# Patient Record
Sex: Male | Born: 1940
Health system: Southern US, Community
[De-identification: ages and names within clinical notes are randomized; demographics above are authoritative.]

## PROBLEM LIST (undated history)

## (undated) DIAGNOSIS — E119 Type 2 diabetes mellitus without complications: Secondary | ICD-10-CM

## (undated) DIAGNOSIS — I509 Heart failure, unspecified: Secondary | ICD-10-CM

## (undated) DIAGNOSIS — E785 Hyperlipidemia, unspecified: Secondary | ICD-10-CM

## (undated) DIAGNOSIS — I219 Acute myocardial infarction, unspecified: Secondary | ICD-10-CM

## (undated) DIAGNOSIS — I10 Essential (primary) hypertension: Secondary | ICD-10-CM

## (undated) HISTORY — PX: APPENDECTOMY: SHX54

## (undated) HISTORY — PX: PR VEIN BYPASS GRAFT,AORTO-FEM-POP: 35551

## (undated) HISTORY — DX: Heart failure, unspecified: I50.9

## (undated) HISTORY — PX: CARDIAC SURGERY: SHX584

## (undated) HISTORY — DX: Acute myocardial infarction, unspecified: I21.9

## (undated) HISTORY — DX: Type 2 diabetes mellitus without complications: E11.9

## (undated) HISTORY — DX: Hyperlipidemia, unspecified: E78.5

## (undated) HISTORY — PX: CORONARY ARTERY BYPASS GRAFT: SHX141

## (undated) HISTORY — DX: Essential (primary) hypertension: I10

## (undated) HISTORY — PX: HERNIA REPAIR: SHX51

---

## 1997-10-15 ENCOUNTER — Inpatient Hospital Stay (HOSPITAL_COMMUNITY): Admission: AD | Admit: 1997-10-15 | Discharge: 1997-10-24 | Payer: Self-pay | Admitting: Cardiology

## 2008-01-24 ENCOUNTER — Ambulatory Visit: Payer: Self-pay | Admitting: Specialist

## 2008-10-17 ENCOUNTER — Ambulatory Visit: Payer: Self-pay | Admitting: Specialist

## 2009-04-16 ENCOUNTER — Encounter: Admission: RE | Admit: 2009-04-16 | Discharge: 2009-04-16 | Payer: Self-pay | Admitting: Unknown Physician Specialty

## 2011-10-10 ENCOUNTER — Emergency Department: Payer: Self-pay | Admitting: Emergency Medicine

## 2011-10-10 LAB — URINALYSIS, COMPLETE
Glucose,UR: >=500 mg/dL (ref 0–75)
Nitrite: NEGATIVE
Ph: 5 (ref 4.5–8.0)
Protein: 30
RBC,UR: 113 /HPF (ref 0–5)
Specific Gravity: 1.014 (ref 1.003–1.030)

## 2011-11-03 ENCOUNTER — Ambulatory Visit: Payer: Self-pay | Admitting: Urology

## 2011-11-03 LAB — CBC WITH DIFFERENTIAL/PLATELET
Basophil #: 0 10*3/uL (ref 0.0–0.1)
Eosinophil %: 2.1 %
Lymphocyte #: 1.2 10*3/uL (ref 1.0–3.6)
Lymphocyte %: 27.9 %
MCH: 30.6 pg (ref 26.0–34.0)
MCV: 91 fL (ref 80–100)
Monocyte %: 7.8 %
Neutrophil %: 61.6 %
Platelet: 148 10*3/uL — ABNORMAL LOW (ref 150–440)
RDW: 13.8 % (ref 11.5–14.5)
WBC: 4.4 10*3/uL (ref 3.8–10.6)

## 2011-11-03 LAB — BASIC METABOLIC PANEL
Calcium, Total: 8.9 mg/dL (ref 8.5–10.1)
Chloride: 103 mmol/L (ref 98–107)
Osmolality: 275 (ref 275–301)
Potassium: 4.6 mmol/L (ref 3.5–5.1)

## 2011-11-12 ENCOUNTER — Ambulatory Visit: Payer: Self-pay | Admitting: Urology

## 2011-12-31 ENCOUNTER — Encounter: Payer: Self-pay | Admitting: Urology

## 2012-01-15 ENCOUNTER — Encounter: Payer: Self-pay | Admitting: Urology

## 2013-07-17 DIAGNOSIS — I2510 Atherosclerotic heart disease of native coronary artery without angina pectoris: Secondary | ICD-10-CM | POA: Insufficient documentation

## 2013-07-17 DIAGNOSIS — E7849 Other hyperlipidemia: Secondary | ICD-10-CM | POA: Insufficient documentation

## 2013-07-17 DIAGNOSIS — I10 Essential (primary) hypertension: Secondary | ICD-10-CM | POA: Insufficient documentation

## 2013-07-17 DIAGNOSIS — I739 Peripheral vascular disease, unspecified: Secondary | ICD-10-CM | POA: Insufficient documentation

## 2013-07-17 DIAGNOSIS — E119 Type 2 diabetes mellitus without complications: Secondary | ICD-10-CM | POA: Insufficient documentation

## 2013-07-17 DIAGNOSIS — N400 Enlarged prostate without lower urinary tract symptoms: Secondary | ICD-10-CM | POA: Insufficient documentation

## 2013-07-17 DIAGNOSIS — L408 Other psoriasis: Secondary | ICD-10-CM | POA: Insufficient documentation

## 2014-07-03 NOTE — Op Note (Signed)
PATIENT NAME:  Christopher Snow, Christopher Snow MR#:  [id number] DATE OF BIRTH:  03-May-1940  DATE OF PROCEDURE:  11/12/2011  PREOPERATIVE DIAGNOSES:  1. Urinary retention.  2. Massive benign prostatic hypertrophy.   POSTOPERATIVE DIAGNOSES:  1. Urinary retention.  2. Massive benign prostatic hypertrophy.  PROCEDURE PERFORMED: Photovaporization of the prostate with a green light laser.   SURGEON: Anola GurneyMichael Steve Gregg, MD   ANESTHETIST: Cena BentonG. F. Van Staveren, MD    ANESTHESIA: General.   INDICATIONS: See the dictated History and Physical. After informed consent, the patient requests the above procedure.   OPERATIVE SUMMARY: After adequate anesthesia had been attained, the patient was placed into dorsal lithotomy position and the perineum was prepped and draped in the usual fashion. The laser scope was coupled with the camera and then visually advanced into the bladder. The patient had trilobar benign prostatic hypertrophy with large intravesical growth of median lobe. He had a heavily trabeculated bladder with cellules and small diverticula present. Both ureteral orifices were identified and had clear efflux. No bladder tumors were identified. At this point, the green light XPS laser fiber was introduced through the scope and set at 80 watts. Part of the median lobe and bladder neck was vaporized. However, the tissue did not respond well at this power setting. The power was, therefore, increased to 120 watts. At this point, the majority of the median lobe was vaporized. Power was then increased to 180 watts and remaining obstructive tissue was vaporized from the level of the bladder neck to the verumontanum. The scope was then removed and a 22-French catheter placed. The catheter was irrigated until clear. A B and O suppository was placed.   The procedure was then terminated, and the patient was transferred to the recovery room in stable condition.   ____________________________ Suszanne ConnersMichael R. Evelene CroonWolff, MD [email or web address] Snow: 11/12/2011  13:12:25 ET T: 11/12/2011 13:28:28 ET JOB#: [id number]  cc: Suszanne ConnersMichael R. Evelene CroonWolff, MD, <Dictator> Orson ApeMICHAEL R Florencia Zaccaro MD ELECTRONICALLY SIGNED 11/12/2011 15:28

## 2014-07-03 NOTE — H&P (Signed)
PATIENT NAME:  Christopher Snow, Christopher Snow MR#:  [id number] DATE OF BIRTH:  11/24/1940  DATE OF ADMISSION:  11/09/2011  CHIEF COMPLAINT: Urinary retention.   HISTORY OF PRESENT ILLNESS: Mr. Christopher Snow is a 74 year old white male with a long history of lower urinary tract symptoms who developed urinary retention and currently has a Foley catheter in place. He is currently on Avodart and had been on Flomax but could not void in spite of that. He comes in now for photovaporization of the prostate with green light laser.   ALLERGIES: No drug allergies.   CURRENT MEDICATIONS:  1. Clindamycin ointment.  2. Rosuvastatin.  3. Glipizide. 4. Lisinopril. 5. Metformin. 6. Glucophage. 7. Citalopram. 8. Avodart.   PAST SURGICAL HISTORY:  1. Tonsillectomy in 1950. 2. Appendectomy in 1946. 3. Y graft in 1997.  4. Coronary artery bypass graft x5 in 1998. 5. Repair of ventral hernia in 2001.   SOCIAL HISTORY: He denied tobacco or alcohol use.   FAMILY HISTORY: Noncontributory.   PAST AND CURRENT MEDICAL CONDITIONS:  1. Diabetes. 2. Coronary artery disease. 3. Aortic vascular disease.  4. Hyperlipidemia.  5. History of benign renal cyst. 6. History of folliculitis. 7. Varicose veins.  REVIEW OF SYSTEMS: The patient denied chest pain, shortness of breath, stroke, diarrhea, bruising, or chest pain.   PHYSICAL EXAMINATION:   GENERAL: Well nourished white male in no distress.   HEENT: Sclerae were clear. Pupils were equally round and reactive to light and accommodation. Extraocular movements were intact.   NECK: Supple. No palpable cervical adenopathy. No audible carotid bruits.   LUNGS: Clear to auscultation.   CARDIOVASCULAR: Regular rhythm and rate without audible murmurs.   ABDOMEN: Soft, nontender abdomen.   GENITOURINARY: Uncircumcised. Testes were smooth and nontender.   RECTAL: 50 gram nodular prostate.   NEUROMUSCULAR: Grossly intact.  Cystoscopy June 27th indicated trilobar BPH with large  intravesical growth of median lobe and numerous bladder diverticula.   Creatinine was 1.2 mg/dL June 09WJ25th. PSA was 5.3 ng June 19th.    IMPRESSION:  1. Chronic bladder outlet obstruction. 2. Trilobar benign prostatic hypertrophy with median lobe.   PLAN: Photovaporization of the prostate with green light laser.   ____________________________ Suszanne ConnersMichael R. Evelene CroonWolff, MD [email or web address] Snow: 11/03/2011 10:17:37 ET T: 11/03/2011 10:32:19 ET JOB#: [id number]  cc: Suszanne ConnersMichael R. Evelene CroonWolff, MD, <Dictator> Orson ApeMICHAEL R WOLFF MD ELECTRONICALLY SIGNED 11/04/2011 12:42

## 2016-01-09 DIAGNOSIS — Z23 Encounter for immunization: Secondary | ICD-10-CM | POA: Diagnosis not present

## 2016-01-20 DIAGNOSIS — H60543 Acute eczematoid otitis externa, bilateral: Secondary | ICD-10-CM | POA: Diagnosis not present

## 2016-01-20 DIAGNOSIS — H60312 Diffuse otitis externa, left ear: Secondary | ICD-10-CM | POA: Diagnosis not present

## 2016-01-31 DIAGNOSIS — M542 Cervicalgia: Secondary | ICD-10-CM | POA: Diagnosis not present

## 2016-01-31 DIAGNOSIS — R0789 Other chest pain: Secondary | ICD-10-CM | POA: Diagnosis not present

## 2016-02-19 DIAGNOSIS — M5416 Radiculopathy, lumbar region: Secondary | ICD-10-CM | POA: Diagnosis not present

## 2016-02-19 DIAGNOSIS — M545 Low back pain: Secondary | ICD-10-CM | POA: Diagnosis not present

## 2016-02-19 DIAGNOSIS — M47816 Spondylosis without myelopathy or radiculopathy, lumbar region: Secondary | ICD-10-CM | POA: Diagnosis not present

## 2016-02-20 ENCOUNTER — Other Ambulatory Visit: Payer: Self-pay | Admitting: Student

## 2016-02-20 DIAGNOSIS — M5416 Radiculopathy, lumbar region: Secondary | ICD-10-CM

## 2016-02-20 DIAGNOSIS — M47816 Spondylosis without myelopathy or radiculopathy, lumbar region: Secondary | ICD-10-CM

## 2016-02-21 DIAGNOSIS — I10 Essential (primary) hypertension: Secondary | ICD-10-CM | POA: Diagnosis not present

## 2016-02-21 DIAGNOSIS — I25110 Atherosclerotic heart disease of native coronary artery with unstable angina pectoris: Secondary | ICD-10-CM | POA: Diagnosis not present

## 2016-02-21 DIAGNOSIS — R079 Chest pain, unspecified: Secondary | ICD-10-CM | POA: Diagnosis not present

## 2016-02-24 ENCOUNTER — Other Ambulatory Visit: Payer: Self-pay | Admitting: Student

## 2016-02-24 DIAGNOSIS — M47816 Spondylosis without myelopathy or radiculopathy, lumbar region: Secondary | ICD-10-CM

## 2016-03-04 DIAGNOSIS — I25110 Atherosclerotic heart disease of native coronary artery with unstable angina pectoris: Secondary | ICD-10-CM | POA: Diagnosis not present

## 2016-03-04 DIAGNOSIS — R079 Chest pain, unspecified: Secondary | ICD-10-CM | POA: Diagnosis not present

## 2016-03-06 DIAGNOSIS — I10 Essential (primary) hypertension: Secondary | ICD-10-CM | POA: Diagnosis not present

## 2016-03-06 DIAGNOSIS — R079 Chest pain, unspecified: Secondary | ICD-10-CM | POA: Diagnosis not present

## 2016-03-06 DIAGNOSIS — I2510 Atherosclerotic heart disease of native coronary artery without angina pectoris: Secondary | ICD-10-CM | POA: Diagnosis not present

## 2016-04-07 DIAGNOSIS — B354 Tinea corporis: Secondary | ICD-10-CM | POA: Diagnosis not present

## 2016-04-07 DIAGNOSIS — J069 Acute upper respiratory infection, unspecified: Secondary | ICD-10-CM | POA: Diagnosis not present

## 2016-05-21 DIAGNOSIS — M9903 Segmental and somatic dysfunction of lumbar region: Secondary | ICD-10-CM | POA: Diagnosis not present

## 2016-05-21 DIAGNOSIS — M9901 Segmental and somatic dysfunction of cervical region: Secondary | ICD-10-CM | POA: Diagnosis not present

## 2016-05-21 DIAGNOSIS — M9902 Segmental and somatic dysfunction of thoracic region: Secondary | ICD-10-CM | POA: Diagnosis not present

## 2016-05-26 DIAGNOSIS — M9902 Segmental and somatic dysfunction of thoracic region: Secondary | ICD-10-CM | POA: Diagnosis not present

## 2016-05-26 DIAGNOSIS — M9901 Segmental and somatic dysfunction of cervical region: Secondary | ICD-10-CM | POA: Diagnosis not present

## 2016-05-26 DIAGNOSIS — M9903 Segmental and somatic dysfunction of lumbar region: Secondary | ICD-10-CM | POA: Diagnosis not present

## 2016-05-28 DIAGNOSIS — M9901 Segmental and somatic dysfunction of cervical region: Secondary | ICD-10-CM | POA: Diagnosis not present

## 2016-05-28 DIAGNOSIS — M9903 Segmental and somatic dysfunction of lumbar region: Secondary | ICD-10-CM | POA: Diagnosis not present

## 2016-05-28 DIAGNOSIS — M9902 Segmental and somatic dysfunction of thoracic region: Secondary | ICD-10-CM | POA: Diagnosis not present

## 2016-06-01 DIAGNOSIS — M9902 Segmental and somatic dysfunction of thoracic region: Secondary | ICD-10-CM | POA: Diagnosis not present

## 2016-06-01 DIAGNOSIS — M9903 Segmental and somatic dysfunction of lumbar region: Secondary | ICD-10-CM | POA: Diagnosis not present

## 2016-06-01 DIAGNOSIS — M9901 Segmental and somatic dysfunction of cervical region: Secondary | ICD-10-CM | POA: Diagnosis not present

## 2016-06-03 DIAGNOSIS — M9903 Segmental and somatic dysfunction of lumbar region: Secondary | ICD-10-CM | POA: Diagnosis not present

## 2016-06-03 DIAGNOSIS — M9902 Segmental and somatic dysfunction of thoracic region: Secondary | ICD-10-CM | POA: Diagnosis not present

## 2016-06-03 DIAGNOSIS — M9901 Segmental and somatic dysfunction of cervical region: Secondary | ICD-10-CM | POA: Diagnosis not present

## 2016-06-04 DIAGNOSIS — R079 Chest pain, unspecified: Secondary | ICD-10-CM | POA: Diagnosis not present

## 2016-06-04 DIAGNOSIS — I2510 Atherosclerotic heart disease of native coronary artery without angina pectoris: Secondary | ICD-10-CM | POA: Diagnosis not present

## 2016-06-04 DIAGNOSIS — I10 Essential (primary) hypertension: Secondary | ICD-10-CM | POA: Diagnosis not present

## 2016-06-08 DIAGNOSIS — M9901 Segmental and somatic dysfunction of cervical region: Secondary | ICD-10-CM | POA: Diagnosis not present

## 2016-06-08 DIAGNOSIS — M9902 Segmental and somatic dysfunction of thoracic region: Secondary | ICD-10-CM | POA: Diagnosis not present

## 2016-06-08 DIAGNOSIS — M9903 Segmental and somatic dysfunction of lumbar region: Secondary | ICD-10-CM | POA: Diagnosis not present

## 2016-06-11 DIAGNOSIS — M9903 Segmental and somatic dysfunction of lumbar region: Secondary | ICD-10-CM | POA: Diagnosis not present

## 2016-06-11 DIAGNOSIS — M9901 Segmental and somatic dysfunction of cervical region: Secondary | ICD-10-CM | POA: Diagnosis not present

## 2016-06-11 DIAGNOSIS — M9902 Segmental and somatic dysfunction of thoracic region: Secondary | ICD-10-CM | POA: Diagnosis not present

## 2016-06-16 DIAGNOSIS — M9902 Segmental and somatic dysfunction of thoracic region: Secondary | ICD-10-CM | POA: Diagnosis not present

## 2016-06-16 DIAGNOSIS — M9903 Segmental and somatic dysfunction of lumbar region: Secondary | ICD-10-CM | POA: Diagnosis not present

## 2016-06-16 DIAGNOSIS — M9901 Segmental and somatic dysfunction of cervical region: Secondary | ICD-10-CM | POA: Diagnosis not present

## 2016-06-18 DIAGNOSIS — M9902 Segmental and somatic dysfunction of thoracic region: Secondary | ICD-10-CM | POA: Diagnosis not present

## 2016-06-18 DIAGNOSIS — M9903 Segmental and somatic dysfunction of lumbar region: Secondary | ICD-10-CM | POA: Diagnosis not present

## 2016-06-18 DIAGNOSIS — M9901 Segmental and somatic dysfunction of cervical region: Secondary | ICD-10-CM | POA: Diagnosis not present

## 2016-06-23 DIAGNOSIS — M9903 Segmental and somatic dysfunction of lumbar region: Secondary | ICD-10-CM | POA: Diagnosis not present

## 2016-06-23 DIAGNOSIS — M9902 Segmental and somatic dysfunction of thoracic region: Secondary | ICD-10-CM | POA: Diagnosis not present

## 2016-06-23 DIAGNOSIS — M9901 Segmental and somatic dysfunction of cervical region: Secondary | ICD-10-CM | POA: Diagnosis not present

## 2016-08-18 DIAGNOSIS — M9901 Segmental and somatic dysfunction of cervical region: Secondary | ICD-10-CM | POA: Diagnosis not present

## 2016-08-18 DIAGNOSIS — M9902 Segmental and somatic dysfunction of thoracic region: Secondary | ICD-10-CM | POA: Diagnosis not present

## 2016-08-18 DIAGNOSIS — M9903 Segmental and somatic dysfunction of lumbar region: Secondary | ICD-10-CM | POA: Diagnosis not present

## 2016-08-19 DIAGNOSIS — S90121A Contusion of right lesser toe(s) without damage to nail, initial encounter: Secondary | ICD-10-CM | POA: Diagnosis not present

## 2016-08-31 DIAGNOSIS — S90121A Contusion of right lesser toe(s) without damage to nail, initial encounter: Secondary | ICD-10-CM | POA: Diagnosis not present

## 2016-12-04 DIAGNOSIS — Z23 Encounter for immunization: Secondary | ICD-10-CM | POA: Diagnosis not present

## 2017-02-08 DIAGNOSIS — Z23 Encounter for immunization: Secondary | ICD-10-CM | POA: Diagnosis not present

## 2017-02-08 DIAGNOSIS — S61202A Unspecified open wound of right middle finger without damage to nail, initial encounter: Secondary | ICD-10-CM | POA: Diagnosis not present

## 2017-02-11 DIAGNOSIS — S61202D Unspecified open wound of right middle finger without damage to nail, subsequent encounter: Secondary | ICD-10-CM | POA: Diagnosis not present

## 2017-02-11 DIAGNOSIS — Z5189 Encounter for other specified aftercare: Secondary | ICD-10-CM | POA: Diagnosis not present

## 2017-03-02 DIAGNOSIS — L03032 Cellulitis of left toe: Secondary | ICD-10-CM | POA: Diagnosis not present

## 2017-03-02 DIAGNOSIS — B351 Tinea unguium: Secondary | ICD-10-CM | POA: Diagnosis not present

## 2017-03-02 DIAGNOSIS — L97521 Non-pressure chronic ulcer of other part of left foot limited to breakdown of skin: Secondary | ICD-10-CM | POA: Diagnosis not present

## 2017-03-02 DIAGNOSIS — E1140 Type 2 diabetes mellitus with diabetic neuropathy, unspecified: Secondary | ICD-10-CM | POA: Diagnosis not present

## 2017-03-04 ENCOUNTER — Other Ambulatory Visit (INDEPENDENT_AMBULATORY_CARE_PROVIDER_SITE_OTHER): Payer: PPO

## 2017-03-04 ENCOUNTER — Encounter (INDEPENDENT_AMBULATORY_CARE_PROVIDER_SITE_OTHER): Payer: Self-pay | Admitting: Vascular Surgery

## 2017-03-04 ENCOUNTER — Ambulatory Visit (INDEPENDENT_AMBULATORY_CARE_PROVIDER_SITE_OTHER): Payer: PPO | Admitting: Vascular Surgery

## 2017-03-04 DIAGNOSIS — I7025 Atherosclerosis of native arteries of other extremities with ulceration: Secondary | ICD-10-CM | POA: Diagnosis not present

## 2017-03-04 DIAGNOSIS — L97529 Non-pressure chronic ulcer of other part of left foot with unspecified severity: Secondary | ICD-10-CM | POA: Diagnosis not present

## 2017-03-04 DIAGNOSIS — R0989 Other specified symptoms and signs involving the circulatory and respiratory systems: Secondary | ICD-10-CM

## 2017-03-04 DIAGNOSIS — I25119 Atherosclerotic heart disease of native coronary artery with unspecified angina pectoris: Secondary | ICD-10-CM

## 2017-03-04 DIAGNOSIS — E1151 Type 2 diabetes mellitus with diabetic peripheral angiopathy without gangrene: Secondary | ICD-10-CM | POA: Diagnosis not present

## 2017-03-07 ENCOUNTER — Encounter (INDEPENDENT_AMBULATORY_CARE_PROVIDER_SITE_OTHER): Payer: Self-pay | Admitting: Vascular Surgery

## 2017-03-07 DIAGNOSIS — E119 Type 2 diabetes mellitus without complications: Secondary | ICD-10-CM | POA: Insufficient documentation

## 2017-03-07 DIAGNOSIS — I2510 Atherosclerotic heart disease of native coronary artery without angina pectoris: Secondary | ICD-10-CM | POA: Insufficient documentation

## 2017-03-07 NOTE — Progress Notes (Signed)
MRN : [id number]  Christopher Snow is a 76 y.o. (05-Oct-1940) male who presents with chief complaint of  Chief Complaint  Patient presents with  . New Patient (Initial Visit)    ref Graciela Husbands diminshed pulses  .  History of Present Illness:  The patient is seen for evaluation of painful lower extremities and diminished pulses. Patient notes the pain is always associated with activity and is very consistent day today. Typically, the pain occurs at less than one block, progress is as activity continues to the point that the patient must stop walking. Resting including standing still for several minutes allowed resumption of the activity and the ability to walk a similar distance before stopping again. Uneven terrain and inclined shorten the distance. The pain has been progressive over the past several years. The patient states the inability to walk is now having a profound negative impact on quality of life and daily activities.  He describes having an aortobifemoral bypass remotely.  He also notes he is status post CABG remotely.  The patient denies rest pain or dangling of an extremity off the side of the bed during the night for relief. No open wounds or sores at this time. No prior interventions or surgeries.  No history of back problems or DJD of the lumbar sacral spine.   The patient denies changes in claudication symptoms or new rest pain symptoms.  No new ulcers or wounds of the foot.  The patient's blood pressure has been stable and relatively well controlled. The patient denies amaurosis fugax or recent TIA symptoms. There are no recent neurological changes noted. The patient denies history of DVT, PE or superficial thrombophlebitis. The patient denies recent episodes of angina or shortness of breath.   Current Meds  Medication Sig  . amoxicillin-clavulanate (AUGMENTIN) 875-125 MG tablet Take by mouth 2 (two) times daily.   Marland Kitchen glipiZIDE (GLUCOTROL) 10 MG tablet   . isosorbide  dinitrate (ISORDIL) 30 MG tablet Take 30 mg by mouth daily.  Marland Kitchen losartan (COZAAR) 25 MG tablet Take 25 mg by mouth daily.  . metFORMIN (GLUCOPHAGE) 1000 MG tablet Take 1,000 mg by mouth 2 (two) times daily with a meal.    Past Medical History:  Diagnosis Date  . Diabetes mellitus without complication (HCC)   . Heart attack (HCC)   . Hyperlipidemia   . Hypertension     Past Surgical History:  Procedure Laterality Date  . APPENDECTOMY    . CARDIAC SURGERY     five bypass  . CORONARY ARTERY BYPASS GRAFT    . HERNIA REPAIR    . PR VEIN BYPASS GRAFT,AORTO-FEM-POP      Social History Social History   Tobacco Use  . Smoking status: Former Games developer  . Smokeless tobacco: Never Used  Substance Use Topics  . Alcohol use: No    Frequency: Never  . Drug use: No    Family History Family History  Problem Relation Age of Onset  . Heart disease Mother   . Heart disease Father   . Diabetes Maternal Grandmother   . Diabetes Maternal Grandfather   No family history of bleeding/clotting disorders, porphyria or autoimmune disease   Allergies  Allergen Reactions  . Statins Other (See Comments)     REVIEW OF SYSTEMS (Negative unless checked)  Constitutional: [] Weight loss  [] Fever  [] Chills Cardiac: [] Chest pain   [] Chest pressure   [] Palpitations   [] Shortness of breath when laying flat   [] Shortness of breath with exertion. Vascular:  [  x]Pain in legs with walking   [] Pain in legs at rest  [] History of DVT   [] Phlebitis   [] Swelling in legs   [] Varicose veins   [] Non-healing ulcers Pulmonary:   [] Uses home oxygen   [] Productive cough   [] Hemoptysis   [] Wheeze  [] COPD   [] Asthma Neurologic:  [] Dizziness   [] Seizures   [] History of stroke   [] History of TIA  [] Aphasia   [] Vissual changes   [] Weakness or numbness in arm   [] Weakness or numbness in leg Musculoskeletal:   [] Joint swelling   [] Joint pain   [] Low back pain Hematologic:  [] Easy bruising  [] Easy bleeding   [] Hypercoagulable  state   [] Anemic Gastrointestinal:  [] Diarrhea   [] Vomiting  [] Gastroesophageal reflux/heartburn   [] Difficulty swallowing. Genitourinary:  [] Chronic kidney disease   [] Difficult urination  [] Frequent urination   [] Blood in urine Skin:  [] Rashes   [] Ulcers  Psychological:  [] History of anxiety   []  History of major depression.  Physical Examination  Vitals:   03/04/17 1447  BP: (!) 182/74  Pulse: 65  Resp: 17  Weight: 81.2 kg (179 lb)  Height: 6\' 3"  (1.905 m)   Body mass index is 22.37 kg/m. Gen: WD/WN, NAD Head: [place]/AT, No temporalis wasting.  Ear/Nose/Throat: Hearing grossly intact, nares w/o erythema or drainage, poor dentition Eyes: PER, EOMI, sclera nonicteric.  Neck: Supple, no masses.  No bruit or JVD.  Pulmonary:  Good air movement, clear to auscultation bilaterally, no use of accessory muscles.  Cardiac: RRR, normal S1, S2, no Murmurs. Vascular: Scars consistent with aortobifemoral bypass as well as CABG Vessel Right Left  Radial Palpable Palpable  Ulnar Palpable Palpable  Brachial Palpable Palpable  Carotid Palpable Palpable  Femoral Not Palpable Not Palpable  Popliteal Not Palpable Not Palpable  PT Not Palpable No Palpable  DP Not Palpable Not Palpable   Gastrointestinal: soft, non-distended. No guarding/no peritoneal signs.  Musculoskeletal: M/S 5/5 throughout.  No deformity or atrophy.  Neurologic: CN 2-12 intact. Pain and light touch intact in extremities.  Symmetrical.  Speech is fluent. Motor exam as listed above. Psychiatric: Judgment intact, Mood & affect appropriate for pt's clinical situation. Dermatologic: No rashes or ulcers noted.  No changes consistent with cellulitis. Lymph : No Cervical lymphadenopathy, no lichenification or skin changes of chronic lymphedema.  CBC Lab Results  Component Value Date   WBC 4.4 11/03/2011   HGB 12.1 (L) 11/03/2011   HCT 35.9 (L) 11/03/2011   MCV 91 11/03/2011   PLT 148 (L) 11/03/2011    BMET    Component  Value Date/Time   NA 137 11/03/2011 1126   K 4.6 11/03/2011 1126   CL 103 11/03/2011 1126   CO2 26 11/03/2011 1126   GLUCOSE 111 (H) 11/03/2011 1126   BUN 13 11/03/2011 1126   CREATININE 0.86 11/03/2011 1126   CALCIUM 8.9 11/03/2011 1126   GFRNONAA >60 11/03/2011 1126   GFRAA >60 11/03/2011 1126   CrCl cannot be calculated (Patient's most recent lab result is older than the maximum 21 days allowed.).  COAG No results found for: INR, PROTIME  Radiology No results found.  Assessment/Plan 1. Atherosclerosis of native arteries of the extremities with ulceration (HCC) Recommend:  The patient has experienced increased symptoms and is now describing lifestyle limiting claudication and mild rest pain.  Given his history of aortobifemoral bypass I will order a CT rather than proceed directly to angiography.   Given the severity of the patient's lower extremity symptoms the patient should undergo  CT angiograph.  Risk and benefits were reviewed the patient.  Indications for the procedure were reviewed.  All questions were answered, the patient agrees to proceed.   The patient should continue walking and begin a more formal exercise program.  The patient should continue antiplatelet therapy and aggressive treatment of the lipid abnormalities  The patient will follow up with me to review the CT   - CT ANGIO AO+BIFEM W & OR WO CONTRAST; Future  2. Coronary artery disease involving native coronary artery of native heart with angina pectoris (HCC) Continue cardiac and antihypertensive medications as already ordered and reviewed, no changes at this time.  Continue statin as ordered and reviewed, no changes at this time  Nitrates PRN for chest pain   3. Type 2 diabetes mellitus with diabetic peripheral angiopathy without gangrene, without long-term current use of insulin (HCC) Continue hypoglycemic medications as already ordered, these medications have been reviewed and there are no  changes at this time.  Hgb A1C to be monitored as already arranged by primary service     Levora DredgeGregory Shonita Rinck, MD  03/07/2017 11:27 AM

## 2017-03-11 ENCOUNTER — Other Ambulatory Visit (INDEPENDENT_AMBULATORY_CARE_PROVIDER_SITE_OTHER): Payer: Self-pay | Admitting: Podiatry

## 2017-03-11 DIAGNOSIS — L97529 Non-pressure chronic ulcer of other part of left foot with unspecified severity: Secondary | ICD-10-CM

## 2017-03-11 DIAGNOSIS — R0989 Other specified symptoms and signs involving the circulatory and respiratory systems: Secondary | ICD-10-CM

## 2017-03-17 DIAGNOSIS — E1140 Type 2 diabetes mellitus with diabetic neuropathy, unspecified: Secondary | ICD-10-CM | POA: Diagnosis not present

## 2017-03-17 DIAGNOSIS — L97521 Non-pressure chronic ulcer of other part of left foot limited to breakdown of skin: Secondary | ICD-10-CM | POA: Diagnosis not present

## 2017-03-23 DIAGNOSIS — J40 Bronchitis, not specified as acute or chronic: Secondary | ICD-10-CM | POA: Diagnosis not present

## 2017-04-12 DIAGNOSIS — I10 Essential (primary) hypertension: Secondary | ICD-10-CM | POA: Diagnosis not present

## 2017-04-12 DIAGNOSIS — R079 Chest pain, unspecified: Secondary | ICD-10-CM | POA: Diagnosis not present

## 2017-04-12 DIAGNOSIS — I2510 Atherosclerotic heart disease of native coronary artery without angina pectoris: Secondary | ICD-10-CM | POA: Diagnosis not present

## 2017-05-18 DIAGNOSIS — J069 Acute upper respiratory infection, unspecified: Secondary | ICD-10-CM | POA: Diagnosis not present

## 2017-06-15 DIAGNOSIS — J3489 Other specified disorders of nose and nasal sinuses: Secondary | ICD-10-CM | POA: Diagnosis not present

## 2017-06-15 DIAGNOSIS — B029 Zoster without complications: Secondary | ICD-10-CM | POA: Diagnosis not present

## 2017-06-17 ENCOUNTER — Emergency Department
Admission: EM | Admit: 2017-06-17 | Discharge: 2017-06-17 | Disposition: A | Discharge status: Home / Self Care | Payer: PPO | Attending: Emergency Medicine | Admitting: Emergency Medicine

## 2017-06-17 ENCOUNTER — Encounter: Payer: Self-pay | Admitting: Emergency Medicine

## 2017-06-17 ENCOUNTER — Emergency Department: Payer: PPO

## 2017-06-17 DIAGNOSIS — Z79899 Other long term (current) drug therapy: Secondary | ICD-10-CM | POA: Diagnosis not present

## 2017-06-17 DIAGNOSIS — I4510 Unspecified right bundle-branch block: Secondary | ICD-10-CM | POA: Diagnosis not present

## 2017-06-17 DIAGNOSIS — R509 Fever, unspecified: Secondary | ICD-10-CM | POA: Diagnosis not present

## 2017-06-17 DIAGNOSIS — E119 Type 2 diabetes mellitus without complications: Secondary | ICD-10-CM | POA: Insufficient documentation

## 2017-06-17 DIAGNOSIS — Z951 Presence of aortocoronary bypass graft: Secondary | ICD-10-CM | POA: Insufficient documentation

## 2017-06-17 DIAGNOSIS — I2510 Atherosclerotic heart disease of native coronary artery without angina pectoris: Secondary | ICD-10-CM | POA: Diagnosis not present

## 2017-06-17 DIAGNOSIS — E162 Hypoglycemia, unspecified: Secondary | ICD-10-CM | POA: Diagnosis not present

## 2017-06-17 DIAGNOSIS — I10 Essential (primary) hypertension: Secondary | ICD-10-CM | POA: Insufficient documentation

## 2017-06-17 DIAGNOSIS — E11649 Type 2 diabetes mellitus with hypoglycemia without coma: Secondary | ICD-10-CM | POA: Diagnosis not present

## 2017-06-17 DIAGNOSIS — R4182 Altered mental status, unspecified: Secondary | ICD-10-CM | POA: Diagnosis not present

## 2017-06-17 DIAGNOSIS — Z7984 Long term (current) use of oral hypoglycemic drugs: Secondary | ICD-10-CM | POA: Insufficient documentation

## 2017-06-17 DIAGNOSIS — Z87891 Personal history of nicotine dependence: Secondary | ICD-10-CM | POA: Diagnosis not present

## 2017-06-17 LAB — GLUCOSE, CAPILLARY
Glucose-Capillary: 184 mg/dL — ABNORMAL HIGH (ref 65–99)
Glucose-Capillary: 203 mg/dL — ABNORMAL HIGH (ref 65–99)
Glucose-Capillary: 285 mg/dL — ABNORMAL HIGH (ref 65–99)
Glucose-Capillary: 305 mg/dL — ABNORMAL HIGH (ref 65–99)
Glucose-Capillary: 284 mg/dL — ABNORMAL HIGH (ref 65–99)

## 2017-06-17 LAB — BASIC METABOLIC PANEL
ANION GAP: 12 (ref 5–15)
BUN: 23 mg/dL — ABNORMAL HIGH (ref 6–20)
CHLORIDE: 99 mmol/L — AB (ref 101–111)
CO2: 22 mmol/L (ref 22–32)
Calcium: 8.7 mg/dL — ABNORMAL LOW (ref 8.9–10.3)
Creatinine, Ser: 1.09 mg/dL (ref 0.61–1.24)
GFR calc Af Amer: >60 mL/min (ref >60)
GFR calc non Af Amer: >60 mL/min (ref >60)
Glucose, Bld: 191 mg/dL — ABNORMAL HIGH (ref 65–99)
POTASSIUM: 3.3 mmol/L — AB (ref 3.5–5.1)
Sodium: 133 mmol/L — ABNORMAL LOW (ref 135–145)

## 2017-06-17 LAB — CBC
HEMATOCRIT: 41 % (ref 40.0–52.0)
HEMOGLOBIN: 13.5 g/dL (ref 13.0–18.0)
MCH: 30.4 pg (ref 26.0–34.0)
MCHC: 33 g/dL (ref 32.0–36.0)
MCV: 92 fL (ref 80.0–100.0)
Platelets: 95 10*3/uL — ABNORMAL LOW (ref 150–440)
RBC: 4.45 MIL/uL (ref 4.40–5.90)
RDW: 13.9 % (ref 11.5–14.5)
WBC: 5.1 10*3/uL (ref 3.8–10.6)

## 2017-06-17 MED ORDER — DEXTROSE-NACL 5-0.9 % IV SOLN: INTRAVENOUS | Status: DC

## 2017-06-17 MED ORDER — DEXTROSE-NACL 5-0.45 % IV SOLN: INTRAVENOUS | Status: DC

## 2017-06-17 MED ORDER — LOSARTAN POTASSIUM 50 MG PO TABS
ORAL_TABLET | ORAL | Status: AC
  Administered 2017-06-17: 25 mg via ORAL
  Filled 2017-06-17: qty 1

## 2017-06-17 MED ORDER — BENZONATATE 100 MG PO CAPS: 100.0000 mg | ORAL_CAPSULE | Freq: Three times a day (TID) | ORAL | 0 refills | Status: DC | PRN

## 2017-06-17 MED ORDER — DEXTROSE-NACL 5-0.45 % IV SOLN: 100.0000 mL | Freq: Once | INTRAVENOUS | Status: DC

## 2017-06-17 MED ORDER — LOSARTAN POTASSIUM 50 MG PO TABS
25.0000 mg | ORAL_TABLET | Freq: Once | ORAL | Status: AC
  Administered 2017-06-17: 25 mg via ORAL

## 2017-06-17 MED ORDER — DEXTROSE 5 % AND 0.45 % NACL IV BOLUS
1000.0000 mL | Freq: Once | INTRAVENOUS | Status: AC
  Administered 2017-06-17: 1000 mL via INTRAVENOUS

## 2017-06-17 MED ORDER — AZITHROMYCIN 500 MG PO TABS: 500.0000 mg | ORAL_TABLET | Freq: Every day | ORAL | 0 refills | Status: AC

## 2017-06-17 NOTE — ED Provider Notes (Signed)
[facility] Emergency Department Provider Note  Time seen: 2:30 AM  I have reviewed the triage vital signs and the nursing notes.   HISTORY  Chief Complaint Hypoglycemia    HPI Christopher Snow is a 77 y.o. male with below list of chronic medical conditions including diabetes mellitus and recently diagnosed shingles as well as URI presents to the emergency department with history of awakening this morning and requesting a Pepsi from his wife and subsequently becoming combative.  On EMS arrival patient combative with altered mental status.  Patient was able to have a few sips of Pepsi before EMS arrival.  When EMS checked the patient blood sugar was noted to be 49.  1 amp of D50 was administered via EMS.  Patient no longer combative with no complaints at present.   Past Medical History:  Diagnosis Date  . Diabetes mellitus without complication (HCC)   . Heart attack (HCC)   . Hyperlipidemia   . Hypertension     Patient Active Problem List   Diagnosis Date Noted  . CAD (coronary artery disease) 03/07/2017  . Diabetes (HCC) 03/07/2017  . Atherosclerosis of native arteries of the extremities with ulceration (HCC) 03/04/2017    Past Surgical History:  Procedure Laterality Date  . APPENDECTOMY    . CARDIAC SURGERY     five bypass  . CORONARY ARTERY BYPASS GRAFT    . HERNIA REPAIR    . PR VEIN BYPASS GRAFT,AORTO-FEM-POP      Prior to Admission medications   Medication Sig Start Date End Date Taking? Authorizing Provider  fluticasone (FLONASE) 50 MCG/ACT nasal spray Place 2 sprays into both nostrils daily. 06/15/17  Yes [provider]  glipiZIDE (GLUCOTROL) 10 MG tablet Take 10 mg by mouth 2 (two) times daily before a meal.  02/16/17  Yes [provider]  hydrochlorothiazide (HYDRODIURIL) 25 MG tablet Take 1 tablet by mouth daily. 05/15/17  Yes [provider]  metFORMIN (GLUCOPHAGE) 1000 MG tablet Take 1,000 mg by mouth 2  (two) times daily with a meal.   Yes [provider]  metoprolol tartrate (LOPRESSOR) 25 MG tablet Take 1 tablet by mouth 2 (two) times daily. 04/12/17  Yes [provider]  valACYclovir (VALTREX) 1000 MG tablet Take 1 tablet by mouth 3 (three) times daily. 06/15/17  Yes [provider]  amoxicillin-clavulanate (AUGMENTIN) 875-125 MG tablet Take by mouth 2 (two) times daily.  03/02/17   [provider]  isosorbide dinitrate (ISORDIL) 30 MG tablet Take 30 mg by mouth daily.    [provider]  losartan (COZAAR) 25 MG tablet Take 25 mg by mouth daily.    [provider]    Allergies Statins  Family History  Problem Relation Age of Onset  . Heart disease Mother   . Heart disease Father   . Diabetes Maternal Grandmother   . Diabetes Maternal Grandfather     Social History Social History   Tobacco Use  . Smoking status: Former Games developermoker  . Smokeless tobacco: Never Used  Substance Use Topics  . Alcohol use: No    Frequency: Never  . Drug use: No    Review of Systems Constitutional: No fever/chills Eyes: No visual changes. ENT: No sore throat. Cardiovascular: Denies chest pain. Respiratory: Denies shortness of breath. Gastrointestinal: No abdominal pain.  No nausea, no vomiting.  No diarrhea.  No constipation. Genitourinary: Negative for dysuria. Musculoskeletal: Negative for neck pain.  Negative for back pain. Integumentary: Negative for rash.  Neurological: Negative for headaches, focal weakness or numbness.  Positive for confusion (now resolved)   ____________________________________________   PHYSICAL EXAM:  VITAL SIGNS: ED Triage Vitals  Enc Vitals Group     BP 06/17/17 0215 (!) 215/78     Pulse Rate 06/17/17 0215 72     Resp 06/17/17 0215 18     Temp 06/17/17 0215 98 F (36.7 C)     Temp Source 06/17/17 0215 Oral     SpO2 06/17/17 0215 97 %     Weight 06/17/17 0212 81.2 kg (179 lb)     Height --      Head  Circumference --      Peak Flow --      Pain Score --      Pain Loc --      Pain Edu? --      Excl. in GC? --     Constitutional: Alert and oriented. Well appearing and in no acute distress. Eyes: Conjunctivae are normal. Head: Atraumatic. Mouth/Throat: Mucous membranes are moist.  Oropharynx non-erythematous. Neck: No stridor.   Cardiovascular: Normal rate, regular rhythm. Good peripheral circulation. Grossly normal heart sounds. Respiratory: Normal respiratory effort.  No retractions. Lungs CTAB. Gastrointestinal: Soft and nontender. No distention.  Musculoskeletal: No lower extremity tenderness nor edema. No gross deformities of extremities. Neurologic:  Normal speech and language. No gross focal neurologic deficits are appreciated.  Skin:  Skin is warm, dry and intact. No rash noted. Psychiatric: Mood and affect are normal. Speech and behavior are normal.  ____________________________________________   LABS (all labs ordered are listed, but only abnormal results are displayed)  Labs Reviewed  GLUCOSE, CAPILLARY - Abnormal; Notable for the following components:      Result Value   Glucose-Capillary 184 (*)    All other components within normal limits  BASIC METABOLIC PANEL - Abnormal; Notable for the following components:   Sodium 133 (*)    Potassium 3.3 (*)    Chloride 99 (*)    Glucose, Bld 191 (*)    BUN 23 (*)    Calcium 8.7 (*)    All other components within normal limits  CBC - Abnormal; Notable for the following components:   Platelets 95 (*)    All other components within normal limits  GLUCOSE, CAPILLARY - Abnormal; Notable for the following components:   Glucose-Capillary 203 (*)    All other components within normal limits  GLUCOSE, CAPILLARY - Abnormal; Notable for the following components:   Glucose-Capillary 285 (*)    All other components within normal limits  GLUCOSE, CAPILLARY - Abnormal; Notable for the following components:   Glucose-Capillary  305 (*)    All other components within normal limits  URINALYSIS, COMPLETE (UACMP) WITH MICROSCOPIC  CBG MONITORING, ED  CBG MONITORING, ED  CBG MONITORING, ED   ____________________________________________  EKG  ED ECG REPORT I, [place] N BROWN, the attending physician, personally viewed and interpreted this ECG.   Date: 06/17/2017  EKG Time: 2:15 AM  Rate: 72  Rhythm: Sinus rhythm with a right bundle branch block  Axis: Normal  Intervals: Normal  ST&T Change: None  :   Procedures   ____________________________________________   INITIAL IMPRESSION / ASSESSMENT AND PLAN / ED COURSE  As part of my medical decision making, I reviewed the following data within the electronic MEDICAL RECORD NUMBER  77 year old male presenting with above-stated history and physical exam of altered mental status with noted hypoglycemia before arrival.  Patient was given 1 amp  of D50 before arrival to the emergency department patient alert and oriented x4 with normal behavior during entire ED stay.  Patient's glucose was checked repetitively with no episodes of hypoglycemia noted. ____________________________________________  FINAL CLINICAL IMPRESSION(S) / ED DIAGNOSES  Final diagnoses:  Hypoglycemia     MEDICATIONS GIVEN DURING THIS VISIT:  Medications  losartan (COZAAR) tablet 25 mg (25 mg Oral Given 06/17/17 0241)  dextrose 5 % and 0.45% NaCl 5-0.45 % bolus 1,000 mL (1,000 mLs Intravenous New Bag/Given 06/17/17 0249)     ED Discharge Orders    None       Note:  This document was prepared using Dragon voice recognition software and may include unintentional dictation errors.    Darci Current, MD 06/17/17 [phone]

## 2017-06-17 NOTE — ED Notes (Signed)
Pt discharged home after verbalizing understanding of discharge instructions; nad noted. 

## 2017-06-17 NOTE — ED Triage Notes (Signed)
Pt arrived via EMS from home where EMS reports pt woke wife up and asked for a pepsi then became combative. Pts CBG was 49 with EMS, after 1 AMP D50 and 20 minutes, pt came around and was not combative with EMS. Pt is A&O x4 on arrival to ED. CBG 184. Pt recently diagnosed with shingles and has had recent cold. Treatment at home was antibiotics and hydrocodone cough syrup tonight before bed.

## 2017-06-30 DIAGNOSIS — E7849 Other hyperlipidemia: Secondary | ICD-10-CM | POA: Diagnosis not present

## 2017-06-30 DIAGNOSIS — I2510 Atherosclerotic heart disease of native coronary artery without angina pectoris: Secondary | ICD-10-CM | POA: Diagnosis not present

## 2017-06-30 DIAGNOSIS — I10 Essential (primary) hypertension: Secondary | ICD-10-CM | POA: Diagnosis not present

## 2017-06-30 DIAGNOSIS — E119 Type 2 diabetes mellitus without complications: Secondary | ICD-10-CM | POA: Diagnosis not present

## 2017-09-30 DIAGNOSIS — E119 Type 2 diabetes mellitus without complications: Secondary | ICD-10-CM | POA: Diagnosis not present

## 2017-09-30 DIAGNOSIS — I2510 Atherosclerotic heart disease of native coronary artery without angina pectoris: Secondary | ICD-10-CM | POA: Diagnosis not present

## 2017-10-25 DIAGNOSIS — L03032 Cellulitis of left toe: Secondary | ICD-10-CM | POA: Diagnosis not present

## 2017-11-29 ENCOUNTER — Other Ambulatory Visit: Payer: Self-pay

## 2017-11-29 ENCOUNTER — Inpatient Hospital Stay
Admission: EM | Admit: 2017-11-29 | Discharge: 2017-12-01 | DRG: 304 | Disposition: A | Discharge status: Home / Self Care | Payer: No Typology Code available for payment source | Attending: Internal Medicine | Admitting: Internal Medicine

## 2017-11-29 ENCOUNTER — Emergency Department: Payer: No Typology Code available for payment source

## 2017-11-29 DIAGNOSIS — R079 Chest pain, unspecified: Secondary | ICD-10-CM | POA: Diagnosis not present

## 2017-11-29 DIAGNOSIS — Z8249 Family history of ischemic heart disease and other diseases of the circulatory system: Secondary | ICD-10-CM | POA: Diagnosis not present

## 2017-11-29 DIAGNOSIS — E11649 Type 2 diabetes mellitus with hypoglycemia without coma: Secondary | ICD-10-CM | POA: Diagnosis present

## 2017-11-29 DIAGNOSIS — Z7982 Long term (current) use of aspirin: Secondary | ICD-10-CM | POA: Diagnosis not present

## 2017-11-29 DIAGNOSIS — I252 Old myocardial infarction: Secondary | ICD-10-CM | POA: Diagnosis not present

## 2017-11-29 DIAGNOSIS — E1151 Type 2 diabetes mellitus with diabetic peripheral angiopathy without gangrene: Secondary | ICD-10-CM | POA: Diagnosis not present

## 2017-11-29 DIAGNOSIS — Z888 Allergy status to other drugs, medicaments and biological substances status: Secondary | ICD-10-CM | POA: Diagnosis not present

## 2017-11-29 DIAGNOSIS — Z9119 Patient's noncompliance with other medical treatment and regimen: Secondary | ICD-10-CM

## 2017-11-29 DIAGNOSIS — I5041 Acute combined systolic (congestive) and diastolic (congestive) heart failure: Secondary | ICD-10-CM | POA: Diagnosis not present

## 2017-11-29 DIAGNOSIS — R0602 Shortness of breath: Secondary | ICD-10-CM | POA: Diagnosis not present

## 2017-11-29 DIAGNOSIS — I110 Hypertensive heart disease with heart failure: Secondary | ICD-10-CM | POA: Diagnosis not present

## 2017-11-29 DIAGNOSIS — Z87891 Personal history of nicotine dependence: Secondary | ICD-10-CM

## 2017-11-29 DIAGNOSIS — R000 Tachycardia, unspecified: Secondary | ICD-10-CM | POA: Diagnosis present

## 2017-11-29 DIAGNOSIS — I2510 Atherosclerotic heart disease of native coronary artery without angina pectoris: Secondary | ICD-10-CM | POA: Diagnosis present

## 2017-11-29 DIAGNOSIS — Z7984 Long term (current) use of oral hypoglycemic drugs: Secondary | ICD-10-CM | POA: Diagnosis not present

## 2017-11-29 DIAGNOSIS — Z79899 Other long term (current) drug therapy: Secondary | ICD-10-CM

## 2017-11-29 DIAGNOSIS — Z9114 Patient's other noncompliance with medication regimen: Secondary | ICD-10-CM | POA: Diagnosis not present

## 2017-11-29 DIAGNOSIS — I161 Hypertensive emergency: Secondary | ICD-10-CM | POA: Diagnosis not present

## 2017-11-29 DIAGNOSIS — I255 Ischemic cardiomyopathy: Secondary | ICD-10-CM | POA: Diagnosis not present

## 2017-11-29 DIAGNOSIS — R748 Abnormal levels of other serum enzymes: Secondary | ICD-10-CM | POA: Diagnosis not present

## 2017-11-29 DIAGNOSIS — J810 Acute pulmonary edema: Secondary | ICD-10-CM | POA: Diagnosis not present

## 2017-11-29 DIAGNOSIS — I248 Other forms of acute ischemic heart disease: Secondary | ICD-10-CM | POA: Diagnosis present

## 2017-11-29 DIAGNOSIS — Z833 Family history of diabetes mellitus: Secondary | ICD-10-CM

## 2017-11-29 DIAGNOSIS — I5031 Acute diastolic (congestive) heart failure: Secondary | ICD-10-CM | POA: Diagnosis not present

## 2017-11-29 DIAGNOSIS — E785 Hyperlipidemia, unspecified: Secondary | ICD-10-CM | POA: Diagnosis not present

## 2017-11-29 DIAGNOSIS — E1165 Type 2 diabetes mellitus with hyperglycemia: Secondary | ICD-10-CM | POA: Diagnosis not present

## 2017-11-29 DIAGNOSIS — D696 Thrombocytopenia, unspecified: Secondary | ICD-10-CM | POA: Diagnosis not present

## 2017-11-29 DIAGNOSIS — Z951 Presence of aortocoronary bypass graft: Secondary | ICD-10-CM

## 2017-11-29 LAB — BASIC METABOLIC PANEL
Anion gap: 12 (ref 5–15)
BUN: 19 mg/dL (ref 8–23)
CALCIUM: 9 mg/dL (ref 8.9–10.3)
CHLORIDE: 99 mmol/L (ref 98–111)
CO2: 21 mmol/L — ABNORMAL LOW (ref 22–32)
CREATININE: 1.09 mg/dL (ref 0.61–1.24)
GFR calc Af Amer: >60 mL/min (ref >60)
GFR calc non Af Amer: >60 mL/min (ref >60)
GLUCOSE: 250 mg/dL — AB (ref 70–99)
Potassium: 5 mmol/L (ref 3.5–5.1)
Sodium: 132 mmol/L — ABNORMAL LOW (ref 135–145)

## 2017-11-29 LAB — CBC
HCT: 43.5 % (ref 40.0–52.0)
Hemoglobin: 15.1 g/dL (ref 13.0–18.0)
MCH: 32 pg (ref 26.0–34.0)
MCHC: 34.7 g/dL (ref 32.0–36.0)
MCV: 92.3 fL (ref 80.0–100.0)
PLATELETS: 143 10*3/uL — AB (ref 150–440)
RBC: 4.71 MIL/uL (ref 4.40–5.90)
RDW: 14.3 % (ref 11.5–14.5)
WBC: 10.5 10*3/uL (ref 3.8–10.6)

## 2017-11-29 LAB — TROPONIN I: TROPONIN I: 0.03 ng/mL — AB (ref <0.03)

## 2017-11-29 LAB — PROTIME-INR
INR: 1.04
Prothrombin Time: 13.5 seconds (ref 11.4–15.2)

## 2017-11-29 LAB — APTT: aPTT: 33 seconds (ref 24–36)

## 2017-11-29 MED ORDER — LABETALOL HCL 5 MG/ML IV SOLN
10.0000 mg | Freq: Once | INTRAVENOUS | Status: AC
  Administered 2017-11-29: 10 mg via INTRAVENOUS
  Filled 2017-11-29: qty 4

## 2017-11-29 MED ORDER — ASPIRIN 81 MG PO CHEW
324.0000 mg | CHEWABLE_TABLET | Freq: Once | ORAL | Status: AC
  Administered 2017-11-29: 324 mg via ORAL
  Filled 2017-11-29: qty 4

## 2017-11-29 MED ORDER — HEPARIN BOLUS VIA INFUSION: 4000.0000 [IU] | Freq: Once | INTRAVENOUS | Status: DC

## 2017-11-29 MED ORDER — HEPARIN (PORCINE) IN NACL 100-0.45 UNIT/ML-% IJ SOLN: 1000.0000 [IU]/h | INTRAMUSCULAR | Status: DC

## 2017-11-29 MED ORDER — NITROGLYCERIN 2 % TD OINT
1.0000 [in_us] | TOPICAL_OINTMENT | Freq: Once | TRANSDERMAL | Status: AC
  Administered 2017-11-29: 1 [in_us] via TOPICAL
  Filled 2017-11-29: qty 1

## 2017-11-29 NOTE — H&P (Addendum)
[facility] - [place] at [facility]   PATIENT NAME: Christopher Snow    MR#:  [id number]  DATE OF BIRTH:  06/28/1940  DATE OF ADMISSION:  11/29/2017  PRIMARY CARE PHYSICIAN: Christopher Snow   REQUESTING/REFERRING PHYSICIAN:   CHIEF COMPLAINT:   Chief Complaint  Patient presents with  . Chest Pain  . Shortness of Breath    HISTORY OF PRESENT ILLNESS: Christopher Snow  is a 77 y.o. male with a known history of hypertension, hyperlipidemia diabetes type 2, coronary artery disease, status post CABG 20 years ago. Patient presented to emergency room for acute onset of severe retrosternal chest pressure, started around 6 PM tonight, gradually getting worse.  Nausea and diaphoresis were associated with chest pain.  Patient took 4 nitroglycerin, at home, without any improvement.  Per patient, his chest pain is very similar with the pain he had 20 years ago, when he underwent heart surgery. No shortness of breath, no palpitations, no fever, no chills. At the arrival to emergency room patient was noted with severely elevated blood pressure at 201/116.  Heart rate was elevated, as well, in the 120s.  Patient admits to being noncompliant with his blood pressure medications. He is currently chest pain-free, status post nitro paste in the emergency room.  Blood pressure and heart rate responded to labetalol IV and Nitropaste. Blood test done emergency room are notable for troponin level is 0.03, platelet count at 143, blood sugar at 250. EKG shows sinus tachycardia, with heart rate at 119 bpm, left axis deviation, no acute ischemic changes. Chest x-ray shows borderline cardiomegaly with vascular congestion and tiny pleural effusions. Patient is admitted for further evaluation and treatment.   PAST MEDICAL HISTORY:   Past Medical History:  Diagnosis Date  . Diabetes mellitus without complication (HCC)   . Heart attack (HCC)   . Hyperlipidemia   . Hypertension     PAST SURGICAL  HISTORY:  Past Surgical History:  Procedure Laterality Date  . APPENDECTOMY    . CARDIAC SURGERY     five bypass  . CORONARY ARTERY BYPASS GRAFT    . HERNIA REPAIR    . PR VEIN BYPASS GRAFT,AORTO-FEM-POP      SOCIAL HISTORY:  Social History   Tobacco Use  . Smoking status: Former Games developer  . Smokeless tobacco: Never Used  Substance Use Topics  . Alcohol use: No    Frequency: Never    FAMILY HISTORY:  Family History  Problem Relation Age of Onset  . Heart disease Mother   . Heart disease Father   . Diabetes Maternal Grandmother   . Diabetes Maternal Grandfather     DRUG ALLERGIES:  Allergies  Allergen Reactions  . Statins Other (See Comments)    REVIEW OF SYSTEMS:   CONSTITUTIONAL: No fever, fatigue or weakness.  EYES: No changes in vision.  EARS, NOSE, AND THROAT: No tinnitus or ear pain.  RESPIRATORY: No cough, shortness of breath, wheezing or hemoptysis.  CARDIOVASCULAR: Positive for chest pain, no orthopnea, edema.  GASTROINTESTINAL: Positive for nausea, no vomiting, diarrhea or abdominal pain.  GENITOURINARY: No dysuria, hematuria.  ENDOCRINE: No polyuria, nocturia. HEMATOLOGY: No bleeding. SKIN: No rash or lesion. MUSCULOSKELETAL: No joint pain at this time.   NEUROLOGIC: No focal weakness.  PSYCHIATRY: No anxiety or depression.   MEDICATIONS AT HOME:  Prior to Admission medications   Medication Sig Start Date End Date Taking? Authorizing Provider  aspirin EC 81 MG tablet Take 81 mg by mouth daily.  Yes [provider]  atorvastatin (LIPITOR) 10 MG tablet Take 5 mg by mouth daily at 6 PM.   Yes [provider]  fluticasone (FLONASE) 50 MCG/ACT nasal spray Place 2 sprays into both nostrils daily. 06/15/17  Yes [provider]  glipiZIDE (GLUCOTROL) 10 MG tablet Take 10 mg by mouth 2 (two) times daily before a meal.  02/16/17  Yes [provider]  losartan (COZAAR) 25 MG tablet Take 25 mg by mouth daily.   Yes [provider]  metFORMIN (GLUCOPHAGE) 1000 MG tablet Take 1,000 mg by mouth 2 (two) times daily with a meal.   Yes [provider]  metoprolol succinate (TOPROL-XL) 25 MG 24 hr tablet Take 12.5 mg by mouth daily.   Yes [provider]  hydrochlorothiazide (HYDRODIURIL) 25 MG tablet Take 1 tablet by mouth daily. 05/15/17   [provider]  isosorbide dinitrate (ISORDIL) 30 MG tablet Take 30 mg by mouth daily.    [provider]  metoprolol tartrate (LOPRESSOR) 25 MG tablet Take 1 tablet by mouth 2 (two) times daily. 04/12/17   [provider]  valACYclovir (VALTREX) 1000 MG tablet Take 1 tablet by mouth 3 (three) times daily. 06/15/17   [provider]      PHYSICAL EXAMINATION:   VITAL SIGNS: Blood pressure (!) 182/104, pulse (!) 106, temperature 98.2 F (36.8 C), temperature source Oral, resp. rate 17, height 6\' 2"  (1.88 m), weight 81.6 kg, SpO2 95 %.  GENERAL:  77 y.o.-year-old patient lying in the bed with no acute distress.  EYES: Pupils equal, round, reactive to light and accommodation. No scleral icterus. Extraocular muscles intact.  HEENT: Head atraumatic, normocephalic. Oropharynx and nasopharynx clear.  NECK:  Supple, no jugular venous distention. No thyroid enlargement, no tenderness.  LUNGS: Normal breath sounds bilaterally, no wheezing, rales,rhonchi or crepitation. No use of accessory muscles of respiration.  CARDIOVASCULAR: S1, S2 normal. No S3/S4.  ABDOMEN: Soft, nontender, nondistended. Bowel sounds present. No organomegaly or mass.  EXTREMITIES: No pedal edema, cyanosis, or clubbing.  NEUROLOGIC: Cranial nerves II through XII are intact. Muscle strength 5/5 in all extremities. Sensation intact.   PSYCHIATRIC: The patient is alert and oriented x 3.  SKIN: No obvious rash, lesion, or ulcer.   LABORATORY PANEL:   CBC Recent Labs  Lab 11/29/17 2154  WBC 10.5  HGB 15.1  HCT 43.5  PLT 143*  MCV 92.3  MCH 32.0  MCHC  34.7  RDW 14.3   ------------------------------------------------------------------------------------------------------------------  Chemistries  Recent Labs  Lab 11/29/17 2154  NA 132*  K 5.0  CL 99  CO2 21*  GLUCOSE 250*  BUN 19  CREATININE 1.09  CALCIUM 9.0   ------------------------------------------------------------------------------------------------------------------ estimated creatinine clearance is 63.4 mL/min (by C-G formula based on SCr of 1.09 mg/dL). ------------------------------------------------------------------------------------------------------------------ No results for input(s): TSH, T4TOTAL, T3FREE, THYROIDAB in the last 72 hours.  Invalid input(s): FREET3   Coagulation profile Recent Labs  Lab 11/29/17 2213  INR 1.04   ------------------------------------------------------------------------------------------------------------------- No results for input(s): DDIMER in the last 72 hours. -------------------------------------------------------------------------------------------------------------------  Cardiac Enzymes Recent Labs  Lab 11/29/17 2154  TROPONINI 0.03*   ------------------------------------------------------------------------------------------------------------------ Invalid input(s): POCBNP  ---------------------------------------------------------------------------------------------------------------  Urinalysis    Component Value Date/Time   COLORURINE Yellow 10/10/2011 1535   APPEARANCEUR Turbid 10/10/2011 1535   LABSPEC 1.014 10/10/2011 1535   PHURINE 5.0 10/10/2011 1535   GLUCOSEU >=500 10/10/2011 1535   HGBUR 3+ 10/10/2011 1535   BILIRUBINUR Negative 10/10/2011 1535   KETONESUR Negative 10/10/2011 1535  PROTEINUR 30 mg/dL 56/21/3086 5784   NITRITE Negative 10/10/2011 1535   LEUKOCYTESUR 3+ 10/10/2011 1535     RADIOLOGY: Dg Chest 2 View  Result Date: 11/29/2017 CLINICAL DATA:  Short of breath EXAM: CHEST - 2  VIEW COMPARISON:  06/17/2017 FINDINGS: Post sternotomy changes. Tiny pleural effusions. Borderline to mild cardiomegaly with vascular congestion. No focal consolidation. No pneumothorax. Stable right lower lung nodule. IMPRESSION: 1. Borderline cardiomegaly with vascular congestion and tiny pleural effusions. Electronically Signed   By: Jasmine Pang M.D.   On: 11/29/2017 22:19    EKG: Orders placed or performed during the hospital encounter of 11/29/17  . EKG 12-Lead  . EKG 12-Lead  . ED EKG within 10 minutes  . ED EKG within 10 minutes    IMPRESSION AND PLAN:  1.  Hypertensive emergency, due to noncompliance of blood pressure medications.  Will restart home medications while monitoring blood pressure closely.  Will check 2D echo.  I had a long discussion with the patient and the family about the importance of compliance with medications. 2.  Acute pulmonary edema, precipitated by elevated blood pressure.  Will treat with Lasix IV. 3.  Borderline elevated troponin level, likely due to demand ischemia, hypertensive emergency and pulmonary vascular congestion.  We will continue to monitor closely on telemetry and follow troponin levels, to rule out ACS. 4.  CP, likely related to pulmonary edema. Cont telemetry and follow Trop levels, to r/o ACS. 5.  CAD, status post remote CABG, 20 years ago 6.  Sinus tachycardia, likely precipitated by stopping beta-blocker.  Will restart metoprolol. 7.  Thrombocytopenia, unclear etiology, will continue to monitor.  No active bleeding. 8.  Diabetes type 2, uncontrolled, with hyperglycemia, secondary to noncompliance with medications.  Will start low-carb diet and monitor blood sugars before meals and at bedtime.  Will use insulin treatment during the hospital stay.  Patient complains of episodes of hypoglycemia when he will take his glipizide medication.  He will likely need adjustment/lowering of his glipizide dose, at discharge.  All the records are reviewed  and case discussed with ED provider. Management plans discussed with the patient, family and they are in agreement.  CODE STATUS: Full    TOTAL TIME TAKING CARE OF THIS PATIENT: 50 minutes.    Cammy Copa M.D on 11/29/2017 at 11:55 PM  Between 7am to 6pm - Pager - [phone]  After 6pm go to [email or web address] - password [email or web address] [facility] [place] at [facility]  [phone]  CC: Primary care physician; Christopher Snow

## 2017-11-29 NOTE — ED Notes (Signed)
Date and time results received: 11/29/17 10:32 PM  Test: troponin Critical Value: 0.03  Name of Provider Notified: Dr. Greggory StallionPauchowski  Orders Received? Or Actions Taken?: Orders Received - See Orders for details

## 2017-11-29 NOTE — ED Notes (Signed)
One 81mg  ASA dropped in pts room.  Inventory count done.

## 2017-11-29 NOTE — ED Provider Notes (Signed)
[facility] Emergency Department Provider Note  Time seen: 10:17 PM  I have reviewed the triage vital signs and the nursing notes.   HISTORY  Chief Complaint Chest Pain and Shortness of Breath    HPI Koren Boundaul Douglas Phariss is a 77 y.o. male with a past medical history of diabetes, hypertension, hyperlipidemia, MI, CAD status post bypass 20 years ago who presents to the emergency department for chest pain.  According to the patient around 6 PM tonight he developed chest pain, states he went home around 7 PM took nitroglycerin, waited an hour or 2 and took 2 more nitroglycerin a total of 4 nitroglycerin at home.  Continued to have pain which he describes as substernal 9/10 pressure in the center of his chest.  States mild amount of nausea, became mildly diaphoretic.  Denies any shortness of breath.  Denies any leg pain or swelling.  Denies abdominal pain.  Largely negative review of systems otherwise.   Past Medical History:  Diagnosis Date  . Diabetes mellitus without complication (HCC)   . Heart attack (HCC)   . Hyperlipidemia   . Hypertension     Patient Active Problem List   Diagnosis Date Noted  . CAD (coronary artery disease) 03/07/2017  . Diabetes (HCC) 03/07/2017  . Atherosclerosis of native arteries of the extremities with ulceration (HCC) 03/04/2017    Past Surgical History:  Procedure Laterality Date  . APPENDECTOMY    . CARDIAC SURGERY     five bypass  . CORONARY ARTERY BYPASS GRAFT    . HERNIA REPAIR    . PR VEIN BYPASS GRAFT,AORTO-FEM-POP      Prior to Admission medications   Medication Sig Start Date End Date Taking? Authorizing Provider  amoxicillin-clavulanate (AUGMENTIN) 875-125 MG tablet Take by mouth 2 (two) times daily.  03/02/17   [provider]  benzonatate (TESSALON PERLES) 100 MG capsule Take 1 capsule (100 mg total) by mouth 3 (three) times daily as needed for cough. 06/17/17 06/17/18  Darci CurrentBrown, Vineyard N, MD  fluticasone  (FLONASE) 50 MCG/ACT nasal spray Place 2 sprays into both nostrils daily. 06/15/17   [provider]  glipiZIDE (GLUCOTROL) 10 MG tablet Take 10 mg by mouth 2 (two) times daily before a meal.  02/16/17   [provider]  hydrochlorothiazide (HYDRODIURIL) 25 MG tablet Take 1 tablet by mouth daily. 05/15/17   [provider]  isosorbide dinitrate (ISORDIL) 30 MG tablet Take 30 mg by mouth daily.    [provider]  losartan (COZAAR) 25 MG tablet Take 25 mg by mouth daily.    [provider]  metFORMIN (GLUCOPHAGE) 1000 MG tablet Take 1,000 mg by mouth 2 (two) times daily with a meal.    [provider]  metoprolol tartrate (LOPRESSOR) 25 MG tablet Take 1 tablet by mouth 2 (two) times daily. 04/12/17   [provider]  valACYclovir (VALTREX) 1000 MG tablet Take 1 tablet by mouth 3 (three) times daily. 06/15/17   [provider]    Allergies  Allergen Reactions  . Statins Other (See Comments)    Family History  Problem Relation Age of Onset  . Heart disease Mother   . Heart disease Father   . Diabetes Maternal Grandmother   . Diabetes Maternal Grandfather     Social History Social History   Tobacco Use  . Smoking status: Former Games developermoker  . Smokeless tobacco: Never Used  Substance Use Topics  . Alcohol use: No    Frequency: Never  .  Drug use: No    Review of Systems Constitutional: Negative for fever. Cardiovascular: 9/10 central chest pain Respiratory: Negative for shortness of breath. Gastrointestinal: Negative for abdominal pain.  Mild nausea.  No vomiting Genitourinary: Negative for urinary compaints Musculoskeletal: Negative for leg pain or swelling. Skin: Negative for skin complaints  Neurological: Negative for headache All other ROS negative  ____________________________________________   PHYSICAL EXAM:  VITAL SIGNS: ED Triage Vitals  Enc Vitals Group     BP 11/29/17 2152 (!) 195/112     Pulse Rate  11/29/17 2152 (!) 125     Resp 11/29/17 2152 20     Temp 11/29/17 2152 98.2 F (36.8 C)     Temp Source 11/29/17 2152 Oral     SpO2 11/29/17 2152 94 %     Weight 11/29/17 2148 180 lb (81.6 kg)     Height 11/29/17 2148 6\' 2"  (1.88 m)     Head Circumference --      Peak Flow --      Pain Score 11/29/17 2147 9     Pain Loc --      Pain Edu? --      Excl. in GC? --    Constitutional: Alert and oriented. Well appearing and in no distress. Eyes: Normal exam ENT   Head: Normocephalic and atraumatic.   Mouth/Throat: Mucous membranes are moist. Cardiovascular: Regular rhythm, rate around 120 bpm. Respiratory: Normal respiratory effort without tachypnea nor retractions. Breath sounds are clear.  No obvious wheeze rales or rhonchi. Gastrointestinal: Soft and nontender. No distention.  Musculoskeletal: Nontender with normal range of motion in all extremities. No lower extremity tenderness or edema. Neurologic:  Normal speech and language. No gross focal neurologic deficits  Skin:  Skin is warm, dry and intact.  Psychiatric: Mood and affect are normal.   ____________________________________________    EKG  EKG reviewed and interpreted by myself shows sinus tachycardia 119 bpm with a widened QRS, left axis deviation, largely normal intervals mild ST depression, no ST elevations.  ____________________________________________    RADIOLOGY  X-ray shows cardiomegaly with vascular congestion.  ____________________________________________   INITIAL IMPRESSION / ASSESSMENT AND PLAN / ED COURSE  Pertinent labs & imaging results that were available during my care of the patient were reviewed by me and considered in my medical decision making (see chart for details).  Patient presents to the emergency department for chest pain starting approximately 4 hours ago.  Differential would include ACS, chest wall pain, pneumonia, pneumothorax.  Patient is EKG is concerning shows tachycardia  with fairly diffuse ST depressions.  No ST elevation noted.  Differential would include ACS versus demand ischemia.  Given 9 out of 10 chest pain we will treat with nitroglycerin ointment as a precaution we will start the patient on heparin given his concerning story for likely ACS with significant past medical history.  We will dose 10 mg of IV labetalol as the patient's blood pressure is currently 195/112 and the heart rate is 125.  Daughter states the patient has not been taking his metoprolol because he thinks it makes his chest hurt when he takes metoprolol.  Patient's labs are resulted with a troponin of 0.03, x-ray shows vascular congestion.  Given the normal troponin we will hold off on heparin at this time.  I highly suspect demand ischemia due to heart rate and blood pressure.  We will dose IV labetalol to reduce afterload, patient states after nitroglycerin ointment pain is down from a 9/10 to a 7/10.  Patient appears very comfortable at this time.  Will admit to the hospitalist service for continued treatment.  Patient and family agreeable to plan of care.  ____________________________________________   FINAL CLINICAL IMPRESSION(S) / ED DIAGNOSES  Chest pain    Minna Antis, MD 11/29/17 2243

## 2017-11-29 NOTE — Progress Notes (Addendum)
ANTICOAGULATION CONSULT NOTE - Initial Consult  Pharmacy Consult for heparin drip Indication: chest pain/ACS  Allergies  Allergen Reactions  . Statins Other (See Comments)    Patient Measurements: Height: 6\' 2"  (188 cm) Weight: 180 lb (81.6 kg) IBW/kg (Calculated) : 82.2 Heparin Dosing Weight: 82 kg  Vital Signs: Temp: 98.2 F (36.8 C) (09/16 2152) Temp Source: Oral (09/16 2152) BP: 201/116 (09/16 2230) Pulse Rate: 117 (09/16 2230)  Labs: Recent Labs    11/29/17 2154 11/29/17 2213  HGB 15.1  --   HCT 43.5  --   PLT 143*  --   APTT  --  33  LABPROT  --  13.5  INR  --  1.04  CREATININE 1.09  --   TROPONINI 0.03*  --     Estimated Creatinine Clearance: 63.4 mL/min (by C-G formula based on SCr of 1.09 mg/dL).   Medical History: Past Medical History:  Diagnosis Date  . Diabetes mellitus without complication (HCC)   . Heart attack (HCC)   . Hyperlipidemia   . Hypertension     Medications:  No anticoagulation in PTA meds.  Assessment: Trop 0.03  Goal of Therapy:  Heparin level 0.3-0.7 units/ml Monitor platelets by anticoagulation protocol: Yes   Plan:  4000 unit bolus and initial rate of 1000 units/hr. First heparin level 8 hours after start of infusion.   Heparin d/c by EDP.  Christopher Snow 11/29/2017,10:37 PM

## 2017-11-29 NOTE — ED Triage Notes (Signed)
Patient reports feeling cold, short of breath and having chest pain.  Reports for several hours.

## 2017-11-29 NOTE — ED Notes (Signed)
Pt gone to Xray via stretcher.

## 2017-11-30 ENCOUNTER — Inpatient Hospital Stay
Admit: 2017-11-30 | Discharge: 2017-11-30 | Disposition: A | Discharge status: Home / Self Care | Payer: No Typology Code available for payment source | Attending: Internal Medicine | Admitting: Internal Medicine

## 2017-11-30 DIAGNOSIS — Z888 Allergy status to other drugs, medicaments and biological substances status: Secondary | ICD-10-CM | POA: Diagnosis not present

## 2017-11-30 DIAGNOSIS — E1151 Type 2 diabetes mellitus with diabetic peripheral angiopathy without gangrene: Secondary | ICD-10-CM | POA: Diagnosis present

## 2017-11-30 DIAGNOSIS — E11649 Type 2 diabetes mellitus with hypoglycemia without coma: Secondary | ICD-10-CM | POA: Diagnosis present

## 2017-11-30 DIAGNOSIS — E1165 Type 2 diabetes mellitus with hyperglycemia: Secondary | ICD-10-CM | POA: Diagnosis present

## 2017-11-30 DIAGNOSIS — R000 Tachycardia, unspecified: Secondary | ICD-10-CM | POA: Diagnosis present

## 2017-11-30 DIAGNOSIS — D696 Thrombocytopenia, unspecified: Secondary | ICD-10-CM | POA: Diagnosis present

## 2017-11-30 DIAGNOSIS — Z833 Family history of diabetes mellitus: Secondary | ICD-10-CM | POA: Diagnosis not present

## 2017-11-30 DIAGNOSIS — R079 Chest pain, unspecified: Secondary | ICD-10-CM | POA: Diagnosis present

## 2017-11-30 DIAGNOSIS — Z7984 Long term (current) use of oral hypoglycemic drugs: Secondary | ICD-10-CM | POA: Diagnosis not present

## 2017-11-30 DIAGNOSIS — E785 Hyperlipidemia, unspecified: Secondary | ICD-10-CM | POA: Diagnosis present

## 2017-11-30 DIAGNOSIS — Z87891 Personal history of nicotine dependence: Secondary | ICD-10-CM | POA: Diagnosis not present

## 2017-11-30 DIAGNOSIS — I2510 Atherosclerotic heart disease of native coronary artery without angina pectoris: Secondary | ICD-10-CM | POA: Diagnosis present

## 2017-11-30 DIAGNOSIS — Z9114 Patient's other noncompliance with medication regimen: Secondary | ICD-10-CM | POA: Diagnosis not present

## 2017-11-30 DIAGNOSIS — I110 Hypertensive heart disease with heart failure: Secondary | ICD-10-CM | POA: Diagnosis present

## 2017-11-30 DIAGNOSIS — I161 Hypertensive emergency: Secondary | ICD-10-CM | POA: Diagnosis present

## 2017-11-30 DIAGNOSIS — I252 Old myocardial infarction: Secondary | ICD-10-CM | POA: Diagnosis not present

## 2017-11-30 DIAGNOSIS — I5041 Acute combined systolic (congestive) and diastolic (congestive) heart failure: Secondary | ICD-10-CM | POA: Diagnosis present

## 2017-11-30 DIAGNOSIS — Z7982 Long term (current) use of aspirin: Secondary | ICD-10-CM | POA: Diagnosis not present

## 2017-11-30 DIAGNOSIS — I255 Ischemic cardiomyopathy: Secondary | ICD-10-CM | POA: Diagnosis present

## 2017-11-30 DIAGNOSIS — Z79899 Other long term (current) drug therapy: Secondary | ICD-10-CM | POA: Diagnosis not present

## 2017-11-30 DIAGNOSIS — Z9119 Patient's noncompliance with other medical treatment and regimen: Secondary | ICD-10-CM | POA: Diagnosis not present

## 2017-11-30 DIAGNOSIS — I248 Other forms of acute ischemic heart disease: Secondary | ICD-10-CM | POA: Diagnosis present

## 2017-11-30 DIAGNOSIS — Z951 Presence of aortocoronary bypass graft: Secondary | ICD-10-CM | POA: Diagnosis not present

## 2017-11-30 DIAGNOSIS — Z8249 Family history of ischemic heart disease and other diseases of the circulatory system: Secondary | ICD-10-CM | POA: Diagnosis not present

## 2017-11-30 LAB — CBC
HEMATOCRIT: 35.2 % — AB (ref 40.0–52.0)
Hemoglobin: 12.4 g/dL — ABNORMAL LOW (ref 13.0–18.0)
MCH: 32.2 pg (ref 26.0–34.0)
MCHC: 35.2 g/dL (ref 32.0–36.0)
MCV: 91.5 fL (ref 80.0–100.0)
Platelets: 119 10*3/uL — ABNORMAL LOW (ref 150–440)
RBC: 3.85 MIL/uL — ABNORMAL LOW (ref 4.40–5.90)
RDW: 14.1 % (ref 11.5–14.5)
WBC: 7.6 10*3/uL (ref 3.8–10.6)

## 2017-11-30 LAB — BASIC METABOLIC PANEL
Anion gap: 9 (ref 5–15)
BUN: 20 mg/dL (ref 8–23)
CHLORIDE: 102 mmol/L (ref 98–111)
CO2: 22 mmol/L (ref 22–32)
CREATININE: 1.11 mg/dL (ref 0.61–1.24)
Calcium: 8.3 mg/dL — ABNORMAL LOW (ref 8.9–10.3)
GFR calc Af Amer: >60 mL/min (ref >60)
GFR calc non Af Amer: >60 mL/min (ref >60)
GLUCOSE: 222 mg/dL — AB (ref 70–99)
POTASSIUM: 4.1 mmol/L (ref 3.5–5.1)
SODIUM: 133 mmol/L — AB (ref 135–145)

## 2017-11-30 LAB — GLUCOSE, CAPILLARY
GLUCOSE-CAPILLARY: 202 mg/dL — AB (ref 70–99)
Glucose-Capillary: 224 mg/dL — ABNORMAL HIGH (ref 70–99)
Glucose-Capillary: 156 mg/dL — ABNORMAL HIGH (ref 70–99)
Glucose-Capillary: 116 mg/dL — ABNORMAL HIGH (ref 70–99)
Glucose-Capillary: 122 mg/dL — ABNORMAL HIGH (ref 70–99)

## 2017-11-30 LAB — TROPONIN I
Troponin I: 0.53 ng/mL (ref <0.03)
Troponin I: 1.15 ng/mL (ref <0.03)

## 2017-11-30 LAB — HEPARIN LEVEL (UNFRACTIONATED): Heparin Unfractionated: 0.68 IU/mL (ref 0.30–0.70)

## 2017-11-30 MED ORDER — FUROSEMIDE 10 MG/ML IJ SOLN
40.0000 mg | Freq: Once | INTRAMUSCULAR | Status: AC
  Administered 2017-11-30: 40 mg via INTRAVENOUS
  Filled 2017-11-30: qty 4

## 2017-11-30 MED ORDER — INFLUENZA VAC SPLIT HIGH-DOSE 0.5 ML IM SUSY
0.5000 mL | PREFILLED_SYRINGE | INTRAMUSCULAR | Status: DC
  Filled 2017-11-30: qty 0.5

## 2017-11-30 MED ORDER — ONDANSETRON HCL 4 MG/2ML IJ SOLN: 4.0000 mg | Freq: Four times a day (QID) | INTRAMUSCULAR | Status: DC | PRN

## 2017-11-30 MED ORDER — ACETAMINOPHEN 325 MG PO TABS
650.0000 mg | ORAL_TABLET | Freq: Four times a day (QID) | ORAL | Status: DC | PRN
  Administered 2017-11-30: 650 mg via ORAL
  Filled 2017-11-30: qty 2

## 2017-11-30 MED ORDER — HYDROCHLOROTHIAZIDE 25 MG PO TABS
25.0000 mg | ORAL_TABLET | Freq: Every day | ORAL | Status: DC
  Administered 2017-11-30: 25 mg via ORAL
  Filled 2017-11-30: qty 1

## 2017-11-30 MED ORDER — ASPIRIN EC 81 MG PO TBEC
81.0000 mg | DELAYED_RELEASE_TABLET | Freq: Every day | ORAL | Status: DC
  Administered 2017-11-30 – 2017-12-01 (×2): 81 mg via ORAL
  Filled 2017-11-30 (×2): qty 1

## 2017-11-30 MED ORDER — METOPROLOL SUCCINATE ER 25 MG PO TB24
25.0000 mg | ORAL_TABLET | Freq: Every day | ORAL | Status: DC
  Administered 2017-11-30 – 2017-12-01 (×2): 25 mg via ORAL
  Filled 2017-11-30 (×2): qty 1

## 2017-11-30 MED ORDER — HYDROCODONE-ACETAMINOPHEN 5-325 MG PO TABS: 1.0000 | ORAL_TABLET | ORAL | Status: DC | PRN

## 2017-11-30 MED ORDER — BISACODYL 5 MG PO TBEC: 5.0000 mg | DELAYED_RELEASE_TABLET | Freq: Every day | ORAL | Status: DC | PRN

## 2017-11-30 MED ORDER — TRAZODONE HCL 50 MG PO TABS: 25.0000 mg | ORAL_TABLET | Freq: Every evening | ORAL | Status: DC | PRN

## 2017-11-30 MED ORDER — ISOSORBIDE DINITRATE 30 MG PO TABS
30.0000 mg | ORAL_TABLET | Freq: Every day | ORAL | Status: DC
  Administered 2017-11-30 – 2017-12-01 (×2): 30 mg via ORAL
  Filled 2017-11-30 (×2): qty 1

## 2017-11-30 MED ORDER — INSULIN ASPART 100 UNIT/ML ~~LOC~~ SOLN
0.0000 [IU] | Freq: Three times a day (TID) | SUBCUTANEOUS | Status: DC
  Administered 2017-11-30: 3 [IU] via SUBCUTANEOUS
  Administered 2017-11-30: 5 [IU] via SUBCUTANEOUS
  Administered 2017-12-01 (×2): 3 [IU] via SUBCUTANEOUS
  Filled 2017-11-30 (×4): qty 1

## 2017-11-30 MED ORDER — HEPARIN SODIUM (PORCINE) 5000 UNIT/ML IJ SOLN
5000.0000 [IU] | Freq: Three times a day (TID) | INTRAMUSCULAR | Status: DC
  Administered 2017-11-30 (×2): 5000 [IU] via SUBCUTANEOUS
  Filled 2017-11-30 (×2): qty 1

## 2017-11-30 MED ORDER — DOCUSATE SODIUM 100 MG PO CAPS
100.0000 mg | ORAL_CAPSULE | Freq: Two times a day (BID) | ORAL | Status: DC
  Administered 2017-11-30 – 2017-12-01 (×4): 100 mg via ORAL
  Filled 2017-11-30 (×4): qty 1

## 2017-11-30 MED ORDER — INSULIN ASPART 100 UNIT/ML ~~LOC~~ SOLN
0.0000 [IU] | Freq: Every day | SUBCUTANEOUS | Status: DC
  Administered 2017-11-30: 2 [IU] via SUBCUTANEOUS
  Filled 2017-11-30: qty 1

## 2017-11-30 MED ORDER — FLUTICASONE PROPIONATE 50 MCG/ACT NA SUSP
2.0000 | Freq: Every day | NASAL | Status: DC
  Administered 2017-11-30 – 2017-12-01 (×2): 2 via NASAL
  Filled 2017-11-30: qty 16

## 2017-11-30 MED ORDER — ACETAMINOPHEN 650 MG RE SUPP: 650.0000 mg | Freq: Four times a day (QID) | RECTAL | Status: DC | PRN

## 2017-11-30 MED ORDER — SODIUM CHLORIDE 0.9% FLUSH
3.0000 mL | Freq: Two times a day (BID) | INTRAVENOUS | Status: DC
  Administered 2017-11-30 (×2): 3 mL via INTRAVENOUS

## 2017-11-30 MED ORDER — HEPARIN (PORCINE) IN NACL 100-0.45 UNIT/ML-% IJ SOLN
850.0000 [IU]/h | INTRAMUSCULAR | Status: DC
  Administered 2017-11-30: 950 [IU]/h via INTRAVENOUS
  Filled 2017-11-30: qty 250

## 2017-11-30 MED ORDER — ONDANSETRON HCL 4 MG PO TABS: 4.0000 mg | ORAL_TABLET | Freq: Four times a day (QID) | ORAL | Status: DC | PRN

## 2017-11-30 MED ORDER — LOSARTAN POTASSIUM 25 MG PO TABS
25.0000 mg | ORAL_TABLET | Freq: Every day | ORAL | Status: DC
  Administered 2017-11-30 – 2017-12-01 (×2): 25 mg via ORAL
  Filled 2017-11-30 (×2): qty 1

## 2017-11-30 NOTE — Consult Note (Signed)
ANTICOAGULATION CONSULT NOTE - Initial Consult  Pharmacy Consult for heparin drip Indication: chest pain/ACS  Allergies  Allergen Reactions  . Statins Other (See Comments)    Patient Measurements: Height: 6\' 2"  (188 cm) Weight: 174 lb 2.6 oz (79 kg) IBW/kg (Calculated) : 82.2 Heparin Dosing Weight: 79.9kg  Vital Signs: Temp: 97.5 F (36.4 C) (09/17 0403) Temp Source: Oral (09/17 0403) BP: 134/55 (09/17 0831) Pulse Rate: 54 (09/17 0831)  Labs: Recent Labs    11/29/17 2154 11/29/17 2213 11/30/17 0452 11/30/17 0506 11/30/17 1334  HGB 15.1  --  12.4*  --   --   HCT 43.5  --  35.2*  --   --   PLT 143*  --  119*  --   --   APTT  --  33  --   --   --   LABPROT  --  13.5  --   --   --   INR  --  1.04  --   --   --   CREATININE 1.09  --  1.11  --   --   TROPONINI 0.03*  --   --  0.53* 1.15*    Estimated Creatinine Clearance: 60.3 mL/min (by C-G formula based on SCr of 1.11 mg/dL).   Medical History: Past Medical History:  Diagnosis Date  . Diabetes mellitus without complication (HCC)   . Heart attack (HCC)   . Hyperlipidemia   . Hypertension     Medications:  Scheduled:  . aspirin EC  81 mg Oral Daily  . docusate sodium  100 mg Oral BID  . fluticasone  2 spray Each Nare Daily  . [START ON 12/01/2017] Influenza vac split quadrivalent PF  0.5 mL Intramuscular Tomorrow-1000  . insulin aspart  0-15 Units Subcutaneous TID WC  . insulin aspart  0-5 Units Subcutaneous QHS  . isosorbide dinitrate  30 mg Oral Daily  . losartan  25 mg Oral Daily  . metoprolol succinate  25 mg Oral Daily  . sodium chloride flush  3 mL Intravenous Q12H    Assessment: Patient is a 77 year old male with CAD who presents with SOB, persistent chest pain. Pt found to have pulmonary edema and rising troponin- up to 1.15 currently. Pt had baseline labs done in ED, but drip was not started. No anticoag PTA. Pt received 5000 units heparin subq at 1300 therefore I will skip the bolus.  Goal of  Therapy:  Heparin level 0.3-0.7 units/ml Monitor platelets by anticoagulation protocol: Yes   Plan:  Start heparin infusion at 950 units/hr Check anti-Xa level in 8 hours and daily while on heparin Continue to monitor H&H and platelets  Elizeth Weinrich D Kamren Heintzelman, Pharm.D, BCPS Clinical Pharmacist 11/30/2017,3:02 PM

## 2017-11-30 NOTE — Progress Notes (Signed)
CRITICAL VALUE ALERT  Critical Value:  Troponin 0.53  Date & Time Notied:  11/30/2017  0800  Provider Notified: S. Patel  Orders Received/Actions taken: cardio consult in place, continue to monitor.

## 2017-11-30 NOTE — Consult Note (Signed)
ANTICOAGULATION CONSULT NOTE - Initial Consult  Pharmacy Consult for heparin drip Indication: chest pain/ACS  Allergies  Allergen Reactions  . Statins Other (See Comments)    Patient Measurements: Height: 6\' 2"  (188 cm) Weight: 174 lb 2.6 oz (79 kg) IBW/kg (Calculated) : 82.2 Heparin Dosing Weight: 79.9kg  Vital Signs: Temp: 97.6 F (36.4 C) (09/17 1958) Temp Source: Oral (09/17 1958) BP: 143/75 (09/17 1958) Pulse Rate: 59 (09/17 1958)  Labs: Recent Labs    11/29/17 2154 11/29/17 2213 11/30/17 0452 11/30/17 0506 11/30/17 1334 11/30/17 2256  HGB 15.1  --  12.4*  --   --   --   HCT 43.5  --  35.2*  --   --   --   PLT 143*  --  119*  --   --   --   APTT  --  33  --   --   --   --   LABPROT  --  13.5  --   --   --   --   INR  --  1.04  --   --   --   --   HEPARINUNFRC  --   --   --   --   --  0.68  CREATININE 1.09  --  1.11  --   --   --   TROPONINI 0.03*  --   --  0.53* 1.15*  --     Estimated Creatinine Clearance: 60.3 mL/min (by C-G formula based on SCr of 1.11 mg/dL).   Medical History: Past Medical History:  Diagnosis Date  . Diabetes mellitus without complication (HCC)   . Heart attack (HCC)   . Hyperlipidemia   . Hypertension     Medications:  Scheduled:  . aspirin EC  81 mg Oral Daily  . docusate sodium  100 mg Oral BID  . fluticasone  2 spray Each Nare Daily  . [START ON 12/01/2017] Influenza vac split quadrivalent PF  0.5 mL Intramuscular Tomorrow-1000  . insulin aspart  0-15 Units Subcutaneous TID WC  . insulin aspart  0-5 Units Subcutaneous QHS  . isosorbide dinitrate  30 mg Oral Daily  . losartan  25 mg Oral Daily  . metoprolol succinate  25 mg Oral Daily  . sodium chloride flush  3 mL Intravenous Q12H    Assessment: Patient is a 77 year old male with CAD who presents with SOB, persistent chest pain. Pt found to have pulmonary edema and rising troponin- up to 1.15 currently. Pt had baseline labs done in ED, but drip was not started. No  anticoag PTA. Pt received 5000 units heparin subq at 1300 therefore I will skip the bolus.  Goal of Therapy:  Heparin level 0.3-0.7 units/ml Monitor platelets by anticoagulation protocol: Yes   Plan:  Start heparin infusion at 950 units/hr Check anti-Xa level in 8 hours and daily while on heparin Continue to monitor H&H and platelets  9/18 PM heparin level 0.68. Continue current regimen. Recheck heparin level and CBC with tomorrow AM labs.  Fulton ReekMatt Casin Federici, PharmD, BCPS  11/30/17 11:42 PM

## 2017-11-30 NOTE — Plan of Care (Signed)

## 2017-11-30 NOTE — Care Management Note (Signed)
Case Management Note  Patient Details  Name: Koren Boundaul Douglas Chrismer MRN: [id number] Date of Birth: [birth year]  Subjective/Objective:     Patient lives at home with his wife.  He is independent.  No services in the home.  He is on room air.  Admitted with HTN, CP, and SOB.  On IV heparin, IV lasix.  Echo performed todat.  Cardiology consult done today.  Gets his prescriptions at [facility] and [facility].  His medications cost around $60-80 per month.  He states he is "making it".  Will check [facility] prices and compare.  [facility]  Current with PCP.              Action/Plan:   Expected Discharge Date:                  Expected Discharge Plan:  Home/Self Care  In-House Referral:     Discharge planning Services  CM Consult  Post Acute Care Choice:    Choice offered to:     DME Arranged:    DME Agency:     HH Arranged:    HH Agency:     Status of Service:     If discussed at [employer] Length of Stay Meetings, dates discussed:    Additional Comments:  Sherren KernsJennifer L Everette Dimauro, RN 11/30/2017, 3:56 PM

## 2017-11-30 NOTE — Progress Notes (Signed)
Inpatient Diabetes Program Recommendations  AACE/ADA: New Consensus Statement on Inpatient Glycemic Control (2019)  Target Ranges:  Prepandial:   less than 140 mg/dL      Peak postprandial:   less than 180 mg/dL (1-2 hours)      Critically ill patients:  140 - 180 mg/dL  Results for Christopher Snow, Christopher Snow (MRN [id number]) as of 11/30/2017 14:12  Ref. Range 11/30/2017 01:37 11/30/2017 08:04 11/30/2017 11:35  Glucose-Capillary Latest Ref Range: 70 - 99 mg/dL 409224 (H) 811156 (H) 914202 (H)    Review of Glycemic Control  Diabetes history: DM2 Outpatient Diabetes medications: Glipizide 10 mg BID, Metformin 1000 mg BID Current orders for Inpatient glycemic control: Novolog 0-15 units TID with meals, Novolog 0-5 units QHS  Inpatient Diabetes Program Recommendations:  Insulin - Meal Coverage: Please consider ordering Novolog 3 units TID with meals for meal coverage if patient eats at least 50% of meals.  Thanks, Orlando PennerMarie Daveena Elmore, RN, MSN, CDE Diabetes Coordinator Inpatient Diabetes Program [phone] (Team Pager from 8am to 5pm)

## 2017-11-30 NOTE — Consult Note (Signed)
Reason for Consult: Congestive heart failure borderline troponin known coronary disease Referring Physician: Dr. Jimmie Molly primary Dr. Lydia Guiles hospitalist Cardiologist Dr. Lyndal Rainbow Christopher Snow is an 77 y.o. male.  HPI: Patient is a 77 year old white male known coronary disease 20 years ago five-vessel bypass surgery diabetes hypertension lipidemia peripheral vascular disease including aortobifem patient has significant diabetes reportedly got mixed up in his medications any was taken down incorrectly and started having chest pain shortness of breath dyspnea.  He had been to the [facility] hospital and there was some mixup in his medications at that time.  Patient finally came in because of persistent chest pain and anginal symptoms he had stable angina for quite some time requiring 1-2 nitroglycerin a week.  Patient been treated medically and has done reasonably well until recently now he has significant shortness of breath dyspnea chest pain so presented for further cardiac assessment  Past Medical History:  Diagnosis Date  . Diabetes mellitus without complication ([place])   . Heart attack ([place])   . Hyperlipidemia   . Hypertension     Past Surgical History:  Procedure Laterality Date  . APPENDECTOMY    . CARDIAC SURGERY     five bypass  . CORONARY ARTERY BYPASS GRAFT    . HERNIA REPAIR    . PR VEIN BYPASS GRAFT,AORTO-FEM-POP      Family History  Problem Relation Age of Onset  . Heart disease Mother   . Heart disease Father   . Diabetes Maternal Grandmother   . Diabetes Maternal Grandfather     Social History:  reports that he has quit smoking. He has never used smokeless tobacco. He reports that he does not drink alcohol or use drugs.  Allergies:  Allergies  Allergen Reactions  . Statins Other (See Comments)    Medications: I have reviewed the patient's current medications.  Results for orders placed or performed during the hospital encounter of 11/29/17 (from the past 48  hour(s))  Basic metabolic panel     Status: Abnormal   Collection Time: 11/29/17  9:54 PM  Result Value Ref Range   Sodium 132 (L) 135 - 145 mmol/L   Potassium 5.0 3.5 - 5.1 mmol/L   Chloride 99 98 - 111 mmol/L   CO2 21 (L) 22 - 32 mmol/L   Glucose, Bld 250 (H) 70 - 99 mg/dL   BUN 19 8 - 23 mg/dL   Creatinine, Ser 1.09 0.61 - 1.24 mg/dL   Calcium 9.0 8.9 - 10.3 mg/dL   GFR calc non Af Amer >60 >60 mL/min   GFR calc Af Amer >60 >60 mL/min    Comment: (NOTE) The eGFR has been calculated using the CKD EPI equation. This calculation has not been validated in all clinical situations. eGFR's persistently <60 mL/min signify possible Chronic Kidney Disease.    Anion gap 12 5 - 15    Comment: Performed at [facility], [address] [place],  [address]  CBC     Status: Abnormal   Collection Time: 11/29/17  9:54 PM  Result Value Ref Range   WBC 10.5 3.8 - 10.6 K/uL   RBC 4.71 4.40 - 5.90 MIL/uL   Hemoglobin 15.1 13.0 - 18.0 g/dL   HCT 43.5 40.0 - 52.0 %   MCV 92.3 80.0 - 100.0 fL   MCH 32.0 26.0 - 34.0 pg   MCHC 34.7 32.0 - 36.0 g/dL   RDW 14.3 11.5 - 14.5 %   Platelets 143 (L) 150 -  440 K/uL    Comment: Performed at [facility], [address] [place], [place] [address]  Troponin I     Status: Abnormal   Collection Time: 11/29/17  9:54 PM  Result Value Ref Range   Troponin I 0.03 (HH) <0.03 ng/mL    Comment: CRITICAL RESULT CALLED TO, READ BACK BY AND VERIFIED WITH KELLY PENDELTON 11/29/17 2230 JML Performed at [facility], [address] [place], [place] [address]   Protime-INR     Status: None   Collection Time: 11/29/17 10:13 PM  Result Value Ref Range   Prothrombin Time 13.5 11.4 - 15.2 seconds   INR 1.04     Comment: Performed at [facility], [address] [place], [place] [address]  APTT     Status: None   Collection Time: 11/29/17 10:13 PM  Result Value Ref Range   aPTT 33 24 - 36 seconds    Comment: Performed  at [facility], [address] [place], [place] [address]  Glucose, capillary     Status: Abnormal   Collection Time: 11/30/17  1:37 AM  Result Value Ref Range   Glucose-Capillary 224 (H) 70 - 99 mg/dL   Comment 1 Notify RN    Comment 2 Document in Chart   Basic metabolic panel     Status: Abnormal   Collection Time: 11/30/17  4:52 AM  Result Value Ref Range   Sodium 133 (L) 135 - 145 mmol/L   Potassium 4.1 3.5 - 5.1 mmol/L   Chloride 102 98 - 111 mmol/L   CO2 22 22 - 32 mmol/L   Glucose, Bld 222 (H) 70 - 99 mg/dL   BUN 20 8 - 23 mg/dL   Creatinine, Ser 1.11 0.61 - 1.24 mg/dL   Calcium 8.3 (L) 8.9 - 10.3 mg/dL   GFR calc non Af Amer >60 >60 mL/min   GFR calc Af Amer >60 >60 mL/min    Comment: (NOTE) The eGFR has been calculated using the CKD EPI equation. This calculation has not been validated in all clinical situations. eGFR's persistently <60 mL/min signify possible Chronic Kidney Disease.    Anion gap 9 5 - 15    Comment: Performed at [facility], [address] [place], [place] [address]  CBC     Status: Abnormal   Collection Time: 11/30/17  4:52 AM  Result Value Ref Range   WBC 7.6 3.8 - 10.6 K/uL   RBC 3.85 (L) 4.40 - 5.90 MIL/uL   Hemoglobin 12.4 (L) 13.0 - 18.0 g/dL   HCT 35.2 (L) 40.0 - 52.0 %   MCV 91.5 80.0 - 100.0 fL   MCH 32.2 26.0 - 34.0 pg   MCHC 35.2 32.0 - 36.0 g/dL   RDW 14.1 11.5 - 14.5 %   Platelets 119 (L) 150 - 440 K/uL    Comment: Performed at [facility], [address] [place], [place] [address]  Troponin I     Status: Abnormal   Collection Time: 11/30/17  5:06 AM  Result Value Ref Range   Troponin I 0.53 (HH) <0.03 ng/mL    Comment: CRITICAL RESULT CALLED TO, READ BACK BY AND VERIFIED WITH BAKARE BUNJI'@0627'  ON 11/30/17 BY HKP Performed at [facility], [address] [place], [place] [address]   Glucose, capillary     Status: Abnormal   Collection Time: 11/30/17  8:04 AM  Result Value Ref  Range   Glucose-Capillary 156 (H) 70 - 99 mg/dL  Comment 1 Notify RN   Glucose, capillary     Status: Abnormal   Collection Time: 11/30/17 11:35 AM  Result Value Ref Range   Glucose-Capillary 202 (H) 70 - 99 mg/dL   Comment 1 Notify RN     Dg Chest 2 View  Result Date: 11/29/2017 CLINICAL DATA:  Short of breath EXAM: CHEST - 2 VIEW COMPARISON:  06/17/2017 FINDINGS: Post sternotomy changes. Tiny pleural effusions. Borderline to mild cardiomegaly with vascular congestion. No focal consolidation. No pneumothorax. Stable right lower lung nodule. IMPRESSION: 1. Borderline cardiomegaly with vascular congestion and tiny pleural effusions. Electronically Signed   By: Donavan Foil M.D.   On: 11/29/2017 22:19    Review of Systems  Constitutional: Positive for diaphoresis and malaise/fatigue.  HENT: Positive for congestion.   Eyes: Negative.   Respiratory: Positive for shortness of breath.   Cardiovascular: Positive for chest pain and PND.  Gastrointestinal: Positive for heartburn.  Genitourinary: Negative.   Musculoskeletal: Positive for myalgias.  Skin: Negative.   Neurological: Positive for weakness.  Endo/Heme/Allergies: Negative.   Psychiatric/Behavioral: Negative.    Blood pressure (!) 134/55, pulse (!) 54, temperature (!) 97.5 F (36.4 C), temperature source Oral, resp. rate 16, height '6\' 2"'  (1.88 m), weight 79 kg, SpO2 97 %. Physical Exam  Nursing note and vitals reviewed. Constitutional: He is oriented to person, place, and time. He appears well-developed and well-nourished.  HENT:  Head: Normocephalic and atraumatic.  Eyes: Pupils are equal, round, and reactive to light. Conjunctivae and EOM are normal.  Neck: Normal range of motion. Neck supple.  Cardiovascular: Normal rate, regular rhythm and normal heart sounds.  Respiratory: Effort normal and breath sounds normal.  GI: Soft. Bowel sounds are normal.  Musculoskeletal: Normal range of motion.  Neurological: He is alert  and oriented to person, place, and time. He has normal reflexes.  Skin: Skin is warm and dry.  Psychiatric: He has a normal mood and affect.    Assessment/Plan: Congestive heart failure Coronary artery disease Hypertension Shortness of breath Chest pain Coronary bypass surgery Peripheral vascular disease Diabetes Borderline troponins  . Plan Agree with admit rule out for microinfarction Follow-up cardiac enzymes Continue aspirin therapy Continue blood pressure control with losartan metoprolol HCTZ Continue statin therapy with Lipitor Maintain the nitrates with imdur consider increasing the dose Consider Ranexa Echocardiogram will help for further assessment Elevated troponin probably demand ischemia We will defer cardiac cath for now  [name] 11/30/2017, 2:06 PM

## 2017-11-30 NOTE — Progress Notes (Signed)
[facility] [place] [facility]   PATIENT NAME: Christopher Snow    MR#:  [id number]  DATE OF BIRTH:  18-Sep-1940  SUBJECTIVE:  patient came in with increasing shortness of breath and chest pressure on and off for last few weeks. Has been noticing some ankle swelling also. Denies any chest pain at present. Daughter in the room. Denies diaphoresis nausea vomiting.  REVIEW OF SYSTEMS:   Review of Systems  Constitutional: Negative for chills, fever and weight loss.  HENT: Negative for ear discharge, ear pain and nosebleeds.   Eyes: Negative for blurred vision, pain and discharge.  Respiratory: Positive for shortness of breath. Negative for sputum production, wheezing and stridor.   Cardiovascular: Positive for leg swelling. Negative for chest pain, palpitations, orthopnea and PND.  Gastrointestinal: Negative for abdominal pain, diarrhea, nausea and vomiting.  Genitourinary: Negative for frequency and urgency.  Musculoskeletal: Negative for back pain and joint pain.  Neurological: Negative for sensory change, speech change, focal weakness and weakness.  Psychiatric/Behavioral: Negative for depression and hallucinations. The patient is not nervous/anxious.    Tolerating Diet:yesTolerating PT: ambulatory  DRUG ALLERGIES:   Allergies  Allergen Reactions  . Statins Other (See Comments)    VITALS:  Blood pressure (!) 134/55, pulse (!) 54, temperature (!) 97.5 F (36.4 C), temperature source Oral, resp. rate 16, height 6\' 2"  (1.88 m), weight 79 kg, SpO2 97 %.  PHYSICAL EXAMINATION:   Physical Exam  GENERAL:  77 y.o.-year-old patient lying in the bed with no acute distress.  EYES: Pupils equal, round, reactive to light and accommodation. No scleral icterus. Extraocular muscles intact.  HEENT: Head atraumatic, normocephalic. Oropharynx and nasopharynx clear.  NECK:  Supple, no jugular venous distention. No thyroid enlargement, no tenderness.  LUNGS: Normal breath  sounds bilaterally, no wheezing, ++ rales, no rhonchi. No use of accessory muscles of respiration.  CARDIOVASCULAR: S1, S2 normal. No murmurs, rubs, or gallops.  ABDOMEN: Soft, nontender, nondistended. Bowel sounds present. No organomegaly or mass.  EXTREMITIES: No cyanosis, clubbing or edema b/l.    NEUROLOGIC: Cranial nerves II through XII are intact. No focal Motor or sensory deficits b/l.   PSYCHIATRIC:  patient is alert and oriented x 3.  SKIN: No obvious rash, lesion, or ulcer.   LABORATORY PANEL:  CBC Recent Labs  Lab 11/30/17 0452  WBC 7.6  HGB 12.4*  HCT 35.2*  PLT 119*    Chemistries  Recent Labs  Lab 11/30/17 0452  NA 133*  K 4.1  CL 102  CO2 22  GLUCOSE 222*  BUN 20  CREATININE 1.11  CALCIUM 8.3*   Cardiac Enzymes Recent Labs  Lab 11/30/17 0506  TROPONINI 0.53*   RADIOLOGY:  Dg Chest 2 View  Result Date: 11/29/2017 CLINICAL DATA:  Short of breath EXAM: CHEST - 2 VIEW COMPARISON:  06/17/2017 FINDINGS: Post sternotomy changes. Tiny pleural effusions. Borderline to mild cardiomegaly with vascular congestion. No focal consolidation. No pneumothorax. Stable right lower lung nodule. IMPRESSION: 1. Borderline cardiomegaly with vascular congestion and tiny pleural effusions. Electronically Signed   By: Jasmine PangKim  Fujinaga M.D.   On: 11/29/2017 22:19   ASSESSMENT AND PLAN:   Christopher Snow  is a 77 y.o. male with a known history of hypertension, hyperlipidemia diabetes type 2, coronary artery disease, status post CABG 20 years ago. Patient presented to emergency room for acute onset of severe retrosternal chest pressure, started around 6 PM tonight, gradually getting worse.  Nausea and diaphoresis were associated with chest pain  1.  Hypertensive emergency, due to noncompliance of blood pressure medications -.  Will restart home medications while monitoring blood pressure closely.   -Will check 2D echo.   I had a long discussion with the patient and the family about the  importance of compliance with medications.  2.   acute congestive heart failure likely systolic with acute pulmonary edema, precipitated by elevated blood pressure.  Will treat with Lasix IV. Good UOP Pt has cardiomyopathy with you for 45% on echo of 2017 likely ischemic cmp.  -Echo from today pending. Will consider Lasix PRN.  3.  Borderline elevated troponin level, likely due to demand ischemia, hypertensive emergency and pulmonary vascular congestion.   -We will continue to monitor closely on telemetry and follow troponin levels, to rule out ACS. -Denies any chest pain at present. -Continue aspirin and Nitro patch  4.  CP, likely related to pulmonary edema. Cont telemetry and follow Trop levels  5.  CAD, status post remote CABG, 20 years ago  6.  Sinus tachycardia, likely precipitated by stopping beta-blocker.  Will restart metoprolol.  7.  Thrombocytopenia, unclear etiology, will continue to monitor.  No active bleeding.  8.  Diabetes type 2, uncontrolled, with hyperglycemia, secondary to noncompliance with medications.  Will start low-carb diet and monitor blood sugars before meals and at bedtime.    Discussed with daughter in the room  Case discussed with Care Management/Social Worker. Management plans discussed with the patient, family and they are in agreement.  CODE STATUS: full  DVT Prophylaxis: heparin  TOTAL TIME TAKING CARE OF THIS PATIENT: *30* minutes.  >50% time spent on counselling and coordination of care  POSSIBLE D/C IN *1-2* DAYS, DEPENDING ON CLINICAL CONDITION.  Note: This dictation was prepared with Dragon dictation along with smaller phrase technology. Any transcriptional errors that result from this process are unintentional.  Enedina Finner M.D on 11/30/2017 at 12:53 PM  Between 7am to 6pm - Pager - [phone]  After 6pm go to [email or web address] - Social research officer, government  [facility]  Office  [phone]  CC: Primary care physician;  [place], KernodlePatient ID: Christopher Snow, male   DOB: 07-May-1940, 77 y.o.   MRN: [id number]

## 2017-11-30 NOTE — Progress Notes (Signed)
Patient was transferred from the ER following c/o of CP and SOB. On admission to the unit patient was A&O X4, denied being in pain and he was ambulatory. Patient was placed on the cardiac monitor and oriented to his room. Admission documentation and care plan was reviewed with patient. Patient rested for most of the night without any acute event.

## 2017-11-30 NOTE — Progress Notes (Signed)
*  PRELIMINARY RESULTS* Echocardiogram 2D Echocardiogram has been performed.  Joanette GulaJoan M Margueritte Guthridge 11/30/2017, 9:49 AM

## 2017-12-01 LAB — GLUCOSE, CAPILLARY
GLUCOSE-CAPILLARY: 171 mg/dL — AB (ref 70–99)
GLUCOSE-CAPILLARY: 155 mg/dL — AB (ref 70–99)

## 2017-12-01 LAB — CBC
HCT: 38.1 % — ABNORMAL LOW (ref 40.0–52.0)
HEMOGLOBIN: 13.5 g/dL (ref 13.0–18.0)
MCH: 32.1 pg (ref 26.0–34.0)
MCHC: 35.4 g/dL (ref 32.0–36.0)
MCV: 90.7 fL (ref 80.0–100.0)
Platelets: 137 10*3/uL — ABNORMAL LOW (ref 150–440)
RBC: 4.2 MIL/uL — AB (ref 4.40–5.90)
RDW: 14.3 % (ref 11.5–14.5)
WBC: 6.5 10*3/uL (ref 3.8–10.6)

## 2017-12-01 LAB — HEPARIN LEVEL (UNFRACTIONATED): Heparin Unfractionated: 0.77 IU/mL — ABNORMAL HIGH (ref 0.30–0.70)

## 2017-12-01 LAB — ECHOCARDIOGRAM COMPLETE
HEIGHTINCHES: 74 in
WEIGHTICAEL: 2786.61 [oz_av]

## 2017-12-01 MED ORDER — LOSARTAN POTASSIUM 50 MG PO TABS: 50.0000 mg | ORAL_TABLET | Freq: Every day | ORAL | Status: DC

## 2017-12-01 MED ORDER — LOSARTAN POTASSIUM 25 MG PO TABS: 50.0000 mg | ORAL_TABLET | Freq: Every day | ORAL | 0 refills | Status: DC

## 2017-12-01 MED ORDER — LOSARTAN POTASSIUM 25 MG PO TABS
25.0000 mg | ORAL_TABLET | Freq: Once | ORAL | Status: AC
  Administered 2017-12-01: 25 mg via ORAL
  Filled 2017-12-01: qty 1

## 2017-12-01 NOTE — Discharge Summary (Signed)
[facility] [place] at [facility]   PATIENT NAME: Christopher Snow    MR#:  [id number]  DATE OF BIRTH:  [birth year]  DATE OF ADMISSION:  11/29/2017 ADMITTING PHYSICIAN: Christopher CopaAngela Maier, Snow  DATE OF DISCHARGE: 12/01/2017  PRIMARY CARE PHYSICIAN: Christopher Snow    ADMISSION DIAGNOSIS:  Chest pain, unspecified type [R07.9]  DISCHARGE DIAGNOSIS:  CHF acute Diastolic--mild Uncontrolled HTN CHest pain with mild elevated troponin likely demand ischemia  SECONDARY DIAGNOSIS:   Past Medical History:  Diagnosis Date  . Diabetes mellitus without complication (HCC)   . Heart attack (HCC)   . Hyperlipidemia   . Hypertension     HOSPITAL COURSE:   Christopher Snow a77 y.o.malewith a known history ofhypertension, hyperlipidemia diabetes type 2, coronary artery disease, status post CABG 20 years ago. Patient presented to emergency room for acute onset of severe retrosternal chest pressure, started around 6 PM tonight, gradually getting worse.Nausea and diaphoresis were associated with chest pain  1.Hypertensive emergency,due to noncompliance of blood pressure medications -.Will restart home medications while monitoring blood pressure closely.  -2D echo. EF 50-55% I had a long discussion with the patient and the family about the importance of compliance with medications.  2. acute congestive heart failure diastolic  with acute pulmonary edema, precipitated by elevated blood pressure.Will treat with Lasix IV 40 mg x1--UOP 4400cc. I will not start po lasix for now. Good UOP Pt has cardiomyopathy with you for 45% on echo of 2017 likely ischemic cmp.--improved EF to 50-55%  3.Borderline elevated troponin level,likely due to demand ischemia,hypertensive emergency and pulmonary vascular congestion. -We will continue to monitor closely on telemetry and follow troponin levels, to rule out ACS. -Denies any chest pain at present. -Continue aspirin and  Nitro patch -recieved IV heparin gtt--now d/c since pt asymptomatic and no further cardiac evaluation per cardiology  4. Diabetes type 2,uncontrolled, with hyperglycemia,secondary to noncompliance with medications. Will start low-carb diet and monitor blood sugars before meals and at bedtime.  5.CAD,status post remote CABG, 20 years ago  6.Thrombocytopenia,unclear etiology,will continue to monitor.No active bleeding.  Discussed with daughter in the room. CONSULTS OBTAINED:  Treatment Team:  Alwyn Peaallwood, [name], Snow  DRUG ALLERGIES:   Allergies  Allergen Reactions  . Statins Other (See Comments)    DISCHARGE MEDICATIONS:   Allergies as of 12/01/2017      Reactions   Statins Other (See Comments)      Medication List    STOP taking these medications   metoprolol tartrate 25 MG tablet Commonly known as:  LOPRESSOR     TAKE these medications   aspirin EC 81 MG tablet Take 81 mg by mouth daily.   atorvastatin 10 MG tablet Commonly known as:  LIPITOR Take 5 mg by mouth daily at 6 PM.   fluticasone 50 MCG/ACT nasal spray Commonly known as:  FLONASE Place 2 sprays into both nostrils daily.   glipiZIDE 10 MG tablet Commonly known as:  GLUCOTROL Take 10 mg by mouth 2 (two) times daily before a meal.   hydrochlorothiazide 25 MG tablet Commonly known as:  HYDRODIURIL Take 1 tablet by mouth daily.   isosorbide dinitrate 30 MG tablet Commonly known as:  ISORDIL Take 30 mg by mouth daily.   losartan 25 MG tablet Commonly known as:  COZAAR Take 2 tablets (50 mg total) by mouth daily. What changed:  how much to take   metFORMIN 1000 MG tablet Commonly known as:  GLUCOPHAGE Take 1,000 mg by mouth 2 (  two) times daily with a meal.   metoprolol succinate 25 MG 24 hr tablet Commonly known as:  TOPROL-XL Take 12.5 mg by mouth daily.   valACYclovir 1000 MG tablet Commonly known as:  VALTREX Take 1 tablet by mouth 3 (three) times daily.       If  you experience worsening of your admission symptoms, develop shortness of breath, life threatening emergency, suicidal or homicidal thoughts you must seek medical attention immediately by calling 911 or calling your Snow immediately  if symptoms less severe.  You Must read complete instructions/literature along with all the possible adverse reactions/side effects for all the Medicines you take and that have been prescribed to you. Take any new Medicines after you have completely understood and accept all the possible adverse reactions/side effects.   Please note  You were cared for by a hospitalist during your hospital stay. If you have any questions about your discharge medications or the care you received while you were in the hospital after you are discharged, you can call the unit and asked to speak with the hospitalist on call if the hospitalist that took care of you is not available. Once you are discharged, your primary care physician will handle any further medical issues. Please note that NO REFILLS for any discharge medications will be authorized once you are discharged, as it is imperative that you return to your primary care physician (or establish a relationship with a primary care physician if you do not have one) for your aftercare needs so that they can reassess your need for medications and monitor your lab values. Today   SUBJECTIVE  Doing well. Feeling so much better today. sats good on RA   VITAL SIGNS:  Blood pressure (!) 163/77, pulse 61, temperature 98.2 F (36.8 C), resp. rate 18, height 6\' 2"  (1.88 m), weight 78.1 kg, SpO2 95 %.  I/O:    Intake/Output Summary (Last 24 hours) at 12/01/2017 1235 Last data filed at 12/01/2017 1100 Gross per 24 hour  Intake 119.37 ml  Output 3460 ml  Net -3340.63 ml    PHYSICAL EXAMINATION:  GENERAL:  77 y.o.-year-old patient lying in the bed with no acute distress.  EYES: Pupils equal, round, reactive to light and accommodation. No  scleral icterus. Extraocular muscles intact.  HEENT: Head atraumatic, normocephalic. Oropharynx and nasopharynx clear.  NECK:  Supple, no jugular venous distention. No thyroid enlargement, no tenderness.  LUNGS: Normal breath sounds bilaterally, no wheezing, rales,rhonchi or crepitation. No use of accessory muscles of respiration.  CARDIOVASCULAR: S1, S2 normal. No murmurs, rubs, or gallops.  ABDOMEN: Soft, non-tender, non-distended. Bowel sounds present. No organomegaly or mass.  EXTREMITIES: No pedal edema, cyanosis, or clubbing.  NEUROLOGIC: Cranial nerves II through XII are intact. Muscle strength 5/5 in all extremities. Sensation intact. Gait not checked.  PSYCHIATRIC: The patient is alert and oriented x 3.  SKIN: No obvious rash, lesion, or ulcer.   DATA REVIEW:   CBC  Recent Labs  Lab 12/01/17 0609  WBC 6.5  HGB 13.5  HCT 38.1*  PLT 137*    Chemistries  Recent Labs  Lab 11/30/17 0452  NA 133*  K 4.1  CL 102  CO2 22  GLUCOSE 222*  BUN 20  CREATININE 1.11  CALCIUM 8.3*    Microbiology Results   No results found for this or any previous visit (from the past 240 hour(s)).  RADIOLOGY:  Dg Chest 2 View  Result Date: 11/29/2017 CLINICAL DATA:  Short of breath EXAM:  CHEST - 2 VIEW COMPARISON:  06/17/2017 FINDINGS: Post sternotomy changes. Tiny pleural effusions. Borderline to mild cardiomegaly with vascular congestion. No focal consolidation. No pneumothorax. Stable right lower lung nodule. IMPRESSION: 1. Borderline cardiomegaly with vascular congestion and tiny pleural effusions. Electronically Signed   By: Jasmine Pang M.D.   On: 11/29/2017 22:19     Management plans discussed with the patient, family and they are in agreement.  CODE STATUS:     Code Status Orders  (From admission, onward)         Start     Ordered   11/30/17 0127  Full code  Continuous     11/30/17 0126        Code Status History    This patient has a current code status but no  historical code status.      TOTAL TIME TAKING CARE OF THIS PATIENT: 40 minutes.    Enedina Finner M.D on 12/01/2017 at 12:35 PM  Between 7am to 6pm - Pager - [phone] After 6pm go to [email or web address] - password [email or web address]  [facility]  Office  [phone]  CC: Primary care physician; Christopher Nurse, Snow

## 2017-12-01 NOTE — Care Management (Signed)
After reviewing medications, it does not seem like prescription cost will decrease more [facility]  Will still be approx. $60-80/mo.  Possible D/C today after Echo reading.

## 2017-12-01 NOTE — Plan of Care (Signed)

## 2017-12-01 NOTE — Progress Notes (Signed)
Patient given discharge instructions with daughter at bedside. IV taken out and tele monitor off. Patient verbalized understanding with no further questions. Patient transported home via family vehicle in stable condition.

## 2017-12-05 ENCOUNTER — Other Ambulatory Visit: Payer: Self-pay

## 2017-12-05 ENCOUNTER — Emergency Department
Admission: EM | Admit: 2017-12-05 | Discharge: 2017-12-06 | Disposition: A | Discharge status: Home / Self Care | Payer: No Typology Code available for payment source | Attending: Emergency Medicine | Admitting: Emergency Medicine

## 2017-12-05 ENCOUNTER — Emergency Department: Payer: No Typology Code available for payment source

## 2017-12-05 DIAGNOSIS — R7989 Other specified abnormal findings of blood chemistry: Secondary | ICD-10-CM | POA: Diagnosis not present

## 2017-12-05 DIAGNOSIS — Z7982 Long term (current) use of aspirin: Secondary | ICD-10-CM | POA: Insufficient documentation

## 2017-12-05 DIAGNOSIS — I10 Essential (primary) hypertension: Secondary | ICD-10-CM | POA: Insufficient documentation

## 2017-12-05 DIAGNOSIS — I2510 Atherosclerotic heart disease of native coronary artery without angina pectoris: Secondary | ICD-10-CM | POA: Diagnosis not present

## 2017-12-05 DIAGNOSIS — Z7984 Long term (current) use of oral hypoglycemic drugs: Secondary | ICD-10-CM | POA: Insufficient documentation

## 2017-12-05 DIAGNOSIS — I248 Other forms of acute ischemic heart disease: Secondary | ICD-10-CM | POA: Insufficient documentation

## 2017-12-05 DIAGNOSIS — R079 Chest pain, unspecified: Secondary | ICD-10-CM | POA: Insufficient documentation

## 2017-12-05 DIAGNOSIS — E119 Type 2 diabetes mellitus without complications: Secondary | ICD-10-CM | POA: Insufficient documentation

## 2017-12-05 DIAGNOSIS — Z79899 Other long term (current) drug therapy: Secondary | ICD-10-CM | POA: Diagnosis not present

## 2017-12-05 DIAGNOSIS — R778 Other specified abnormalities of plasma proteins: Secondary | ICD-10-CM

## 2017-12-05 LAB — CBC
HEMATOCRIT: 39.8 % — AB (ref 40.0–52.0)
Hemoglobin: 14 g/dL (ref 13.0–18.0)
MCH: 32 pg (ref 26.0–34.0)
MCHC: 35.2 g/dL (ref 32.0–36.0)
MCV: 90.9 fL (ref 80.0–100.0)
Platelets: 140 10*3/uL — ABNORMAL LOW (ref 150–440)
RBC: 4.37 MIL/uL — ABNORMAL LOW (ref 4.40–5.90)
RDW: 14.1 % (ref 11.5–14.5)
WBC: 9.7 10*3/uL (ref 3.8–10.6)

## 2017-12-05 LAB — BASIC METABOLIC PANEL
Anion gap: 11 (ref 5–15)
BUN: 24 mg/dL — AB (ref 8–23)
CHLORIDE: 99 mmol/L (ref 98–111)
CO2: 21 mmol/L — ABNORMAL LOW (ref 22–32)
CREATININE: 1.12 mg/dL (ref 0.61–1.24)
Calcium: 9.4 mg/dL (ref 8.9–10.3)
GFR calc Af Amer: >60 mL/min (ref >60)
GFR calc non Af Amer: >60 mL/min (ref >60)
GLUCOSE: 196 mg/dL — AB (ref 70–99)
POTASSIUM: 4.5 mmol/L (ref 3.5–5.1)
SODIUM: 131 mmol/L — AB (ref 135–145)

## 2017-12-05 LAB — DIFFERENTIAL
BASOS ABS: 0 10*3/uL (ref 0–0.1)
BASOS PCT: 0 %
EOS ABS: 0.2 10*3/uL (ref 0–0.7)
EOS PCT: 2 %
Lymphocytes Relative: 13 %
Lymphs Abs: 1.2 10*3/uL (ref 1.0–3.6)
Monocytes Absolute: 0.6 10*3/uL (ref 0.2–1.0)
Monocytes Relative: 7 %
NEUTROS PCT: 78 %
Neutro Abs: 7.6 10*3/uL — ABNORMAL HIGH (ref 1.4–6.5)

## 2017-12-05 LAB — HEPATIC FUNCTION PANEL
ALBUMIN: 4.4 g/dL (ref 3.5–5.0)
ALK PHOS: 79 U/L (ref 38–126)
ALT: 26 U/L (ref 0–44)
AST: 30 U/L (ref 15–41)
BILIRUBIN TOTAL: 0.6 mg/dL (ref 0.3–1.2)
Total Protein: 7.3 g/dL (ref 6.5–8.1)

## 2017-12-05 LAB — TROPONIN I: Troponin I: 0.07 ng/mL (ref <0.03)

## 2017-12-05 MED ORDER — ASPIRIN 81 MG PO CHEW
243.0000 mg | CHEWABLE_TABLET | Freq: Once | ORAL | Status: AC
  Administered 2017-12-05: 243 mg via ORAL
  Filled 2017-12-05: qty 3

## 2017-12-05 MED ORDER — NITROGLYCERIN 2 % TD OINT
1.0000 [in_us] | TOPICAL_OINTMENT | Freq: Four times a day (QID) | TRANSDERMAL | Status: DC
  Administered 2017-12-05: 1 [in_us] via TOPICAL
  Filled 2017-12-05: qty 1

## 2017-12-05 MED ORDER — NITROGLYCERIN 2 % TD OINT
TOPICAL_OINTMENT | TRANSDERMAL | Status: AC
  Filled 2017-12-05: qty 1

## 2017-12-05 MED ORDER — METOPROLOL TARTRATE 5 MG/5ML IV SOLN
5.0000 mg | Freq: Once | INTRAVENOUS | Status: AC
  Administered 2017-12-05: 5 mg via INTRAVENOUS
  Filled 2017-12-05: qty 5

## 2017-12-05 NOTE — ED Triage Notes (Signed)
Pt arrives to ED via POV from home with c/o chest pain and SHOB since 6pm today. Family reports pt seen for same and d/c'd from hospital on Wednesday. Family states pt was told he had "demand ischemia". Dr Juliann Paresallwood compared EKGs and determined caterization was not appropriate at that time. Pt reports centralized CP without radiation, but states both arms "feel heavy".

## 2017-12-05 NOTE — ED Provider Notes (Signed)
-----------------------------------------   11:44 PM on 12/05/2017 -----------------------------------------   Assuming care from Dr. Darnelle CatalanMalinda.  In short, Koren Boundaul Douglas Brostrom is a 77 y.o. male with a chief complaint of chest pain.  Refer to the original H&P for additional details.  The current plan of care is to follow up 2nd troponin.  If not increased, anticipate starting back on metoprolol tartrate 25 mg PO BID and touching base with Dr. Lady GaryFath for close follow up.    ----------------------------------------- 1:55 AM on 12/06/2017 -----------------------------------------  The patient's troponin increased slightly to 0.1 from 0.07.  However he is asymptomatic at this time and his blood pressures come down considerably to 153/73.  I offered admission to the patient and his family based on the increased troponin but I also offered to speak with Dr. Lady GaryFath and see if follow-up on Tuesday was appropriate.  The patient and family prefer this since she was just in the hospital.  I called and spoke with Dr. Lady GaryFath by phone and explained the case in detail to the best of my understanding.  Given the patient's troponin was as high as 1.15 last week and that it seems to be related to blood pressure control issues, Dr. Lady GaryFath feels it is appropriate for the patient to be discharged and follow-up on Tuesday as planned with Dr. Darrold JunkerParaschos.  He agreed with my plan to start the patient back on metoprolol.  I discussed the more extensively with the patient and his family and it turns out that he was previously on metoprolol tartrate 25 mg twice daily which he feels worked much better for him.  [facility] succinate 12.5 mg daily, and he does not feel this worked as well.  I am giving him metoprolol tartrate 25 mg by mouth now and a prescription to fill in the morning.  He will take the medication as prescribed including his regular medicine (including nitro as needed for chest pain).  He will  follow-up on Tuesday as planned.  I gave strict return precautions should his symptoms worsen and his family agrees with this plan and it is in fact their preference rather than staying in the hospital tonight.   Loleta RoseForbach, Shanyiah Conde, MD 12/06/17 [phone]

## 2017-12-05 NOTE — ED Provider Notes (Signed)
[facility] Emergency Department Provider Note   ____________________________________________   First MD Initiated Contact with Patient 12/05/17 2114     (approximate)  I have reviewed the triage vital signs and the nursing notes.   HISTORY  Chief Complaint Chest Pain and Shortness of Breath    HPI Christopher Snow is a 77 y.o. male recently in the hospital.  Reports she was discharged went home had a little bit of chest pain that night took a nitro it went away.  The next night he had some chest pain took a nitro went away.  Today he had worse chest pain he is taken 2 nitros nitro has not had the same effect.  Pain got better but did not go away.  Came into the hospital.  He is not having shortness of breath at present.  He still having a little bit of swelling in his legs or feet.  Family confirms that was felt that he was having demand ischemia from his pulmonary edema and hypertension.  Patient's blood pressure was high at home he took an extra pressure pill on direction from Dr. Lady Gary.  Here his blood pressure still high at 200 systolic   Past Medical History:  Diagnosis Date  . Diabetes mellitus without complication (HCC)   . Heart attack (HCC)   . Hyperlipidemia   . Hypertension     Patient Active Problem List   Diagnosis Date Noted  . Hypertensive emergency 11/30/2017  . CAD (coronary artery disease) 03/07/2017  . Diabetes (HCC) 03/07/2017  . Atherosclerosis of native arteries of the extremities with ulceration (HCC) 03/04/2017    Past Surgical History:  Procedure Laterality Date  . APPENDECTOMY    . CARDIAC SURGERY     five bypass  . CORONARY ARTERY BYPASS GRAFT    . HERNIA REPAIR    . PR VEIN BYPASS GRAFT,AORTO-FEM-POP      Prior to Admission medications   Medication Sig Start Date End Date Taking? Authorizing Provider  aspirin EC 81 MG tablet Take 81 mg by mouth daily.    [provider]  atorvastatin (LIPITOR) 10 MG  tablet Take 5 mg by mouth daily at 6 PM.    [provider]  fluticasone (FLONASE) 50 MCG/ACT nasal spray Place 2 sprays into both nostrils daily. 06/15/17   [provider]  glipiZIDE (GLUCOTROL) 10 MG tablet Take 10 mg by mouth 2 (two) times daily before a meal.  02/16/17   [provider]  hydrochlorothiazide (HYDRODIURIL) 25 MG tablet Take 1 tablet by mouth daily. 05/15/17   [provider]  isosorbide dinitrate (ISORDIL) 30 MG tablet Take 30 mg by mouth daily.    [provider]  losartan (COZAAR) 25 MG tablet Take 2 tablets (50 mg total) by mouth daily. 12/01/17   Enedina Finner, MD  metFORMIN (GLUCOPHAGE) 1000 MG tablet Take 1,000 mg by mouth 2 (two) times daily with a meal.    [provider]  metoprolol succinate (TOPROL-XL) 25 MG 24 hr tablet Take 12.5 mg by mouth daily.    [provider]  valACYclovir (VALTREX) 1000 MG tablet Take 1 tablet by mouth 3 (three) times daily. 06/15/17   [provider]    Allergies Statins  Family History  Problem Relation Age of Onset  . Heart disease Mother   . Heart disease Father   . Diabetes Maternal Grandmother   . Diabetes Maternal Grandfather     Social History Social History  Tobacco Use  . Smoking status: Former Games developermoker  . Smokeless tobacco: Never Used  Substance Use Topics  . Alcohol use: No    Frequency: Never  . Drug use: No    Review of Systems  Constitutional: No fever/chills Eyes: No visual changes. ENT: No sore throat. Cardiovascular:  chest pain. Respiratory: Denies shortness of breath. Gastrointestinal: No abdominal pain.  No nausea, no vomiting.  No diarrhea.  No constipation. Genitourinary: Negative for dysuria. Musculoskeletal: Negative for back pain. Skin: Negative for rash. Neurological: Negative for headaches, focal weakness  ____________________________________________   PHYSICAL EXAM:  VITAL SIGNS: ED Triage Vitals  Enc Vitals Group      BP 12/05/17 2053 (!) 217/98     Pulse Rate 12/05/17 2053 (!) 106     Resp 12/05/17 2053 18     Temp 12/05/17 2053 97.8 F (36.6 C)     Temp Source 12/05/17 2053 Oral     SpO2 12/05/17 2053 95 %     Weight 12/05/17 2054 185 lb (83.9 kg)     Height 12/05/17 2054 6\' 2"  (1.88 m)     Head Circumference --      Peak Flow --      Pain Score 12/05/17 2054 9     Pain Loc --      Pain Edu? --      Excl. in GC? --     Constitutional: Alert and oriented. Well appearing and in no acute distress. Eyes: Conjunctivae are normal.  Head: Atraumatic. Nose: No congestion/rhinnorhea. Mouth/Throat: Mucous membranes are moist.  Oropharynx non-erythematous. Neck: No stridor. Cardiovascular: Normal rate, regular rhythm. Grossly normal heart sounds.  Good peripheral circulation. Respiratory: Normal respiratory effort.  No retractions. Lungs CTAB. Gastrointestinal: Soft and nontender. No distention. No abdominal bruits. No CVA tenderness. Musculoskeletal: No lower extremity tenderness some swelling in both feet. Neurologic:  Normal speech and language. No gross focal neurologic deficits are appreciated.  Skin:  Skin is warm, dry and intact. No rash noted. Psychiatric: Mood and affect are normal. Speech and behavior are normal.  ____________________________________________   LABS (all labs ordered are listed, but only abnormal results are displayed)  Labs Reviewed  BASIC METABOLIC PANEL - Abnormal; Notable for the following components:      Result Value   Sodium 131 (*)    CO2 21 (*)    Glucose, Bld 196 (*)    BUN 24 (*)    All other components within normal limits  CBC - Abnormal; Notable for the following components:   RBC 4.37 (*)    HCT 39.8 (*)    Platelets 140 (*)    All other components within normal limits  TROPONIN I - Abnormal; Notable for the following components:   Troponin I 0.07 (*)    All other components within normal limits  DIFFERENTIAL - Abnormal; Notable for the following  components:   Neutro Abs 7.6 (*)    All other components within normal limits  HEPATIC FUNCTION PANEL  TROPONIN I   ____________________________________________  EKG  EKG #1 shows sinus tachycardia rate 113 left axis bundle branch block.  ____________________________________________  RADIOLOGY  ED MD interpretation: Chest x-ray read by radiology is no acute disease I reviewed the films  Official radiology report(s): Dg Chest 2 View  Result Date: 12/05/2017 CLINICAL DATA:  Chest pain EXAM: CHEST - 2 VIEW COMPARISON:  11/29/2017 FINDINGS: The lungs are clear without focal pneumonia, edema, pneumothorax or pleural effusion. Interstitial markings are diffusely coarsened with chronic  features. The cardiopericardial silhouette is within normal limits for size. Patient is status post CABG. Nonacute right rib fractures again noted. IMPRESSION: No acute cardiopulmonary findings. Electronically Signed   By: Kennith Center M.D.   On: 12/05/2017 21:32    ____________________________________________   PROCEDURES  Procedure(s) performed:   Procedures  Critical Care performed:   ____________________________________________   INITIAL IMPRESSION / ASSESSMENT AND PLAN / ED COURSE Patient with marked hypertension not any better with aspirin nitro paste I gave him 5 mg of metoprolol and his blood pressure went from 211 systolic down to 981 heart rate went down as well patient feels better.  We will get his second troponin and discuss his case with Dr. Lady Gary.  I expect that if the second troponin is the same or lower than the initial one we should be able to discharge him home I would expect that we would keep him on a low dose of metoprolol and Dr. Lady Gary can probably see him in the office tomorrow or the next day.  If the troponin is higher course than we would plan on admitting him.  I have signed the patient out to Dr. York Cerise.           ____________________________________________   FINAL CLINICAL IMPRESSION(S) / ED DIAGNOSES  Final diagnoses:  Chest pain, unspecified type     ED Discharge Orders    None       Note:  This document was prepared using Dragon voice recognition software and may include unintentional dictation errors.    Arnaldo Natal, MD 12/06/17 [phone]

## 2017-12-06 LAB — TROPONIN I: TROPONIN I: 0.1 ng/mL — AB (ref <0.03)

## 2017-12-06 LAB — GLUCOSE, CAPILLARY: GLUCOSE-CAPILLARY: 150 mg/dL — AB (ref 70–99)

## 2017-12-06 MED ORDER — METOPROLOL TARTRATE 25 MG PO TABS
25.0000 mg | ORAL_TABLET | ORAL | Status: AC
  Administered 2017-12-06: 25 mg via ORAL
  Filled 2017-12-06: qty 1

## 2017-12-06 MED ORDER — METOPROLOL SUCCINATE ER 50 MG PO TB24: 25.0000 mg | ORAL_TABLET | ORAL | Status: DC

## 2017-12-06 MED ORDER — METOPROLOL TARTRATE 25 MG PO TABS: 25.0000 mg | ORAL_TABLET | Freq: Two times a day (BID) | ORAL | 2 refills | Status: DC

## 2017-12-06 NOTE — Discharge Instructions (Signed)
As we discussed, your troponin (heart enzyme) level was elevated today and it did go up slightly between the 2 test we performed.  We offered admission but also discussed the option of speaking with the cardiologist and following up on Tuesday as planned.  That was her preference so we called and spoke with Dr. Who does feel it is appropriate for you to follow-up as planned.  I recommend that you stop taking the [facility] and that you take the metoprolol tartrate as per the prescription I provided tonight.  Go ahead and take a dose in the morning and again tomorrow night and again on Tuesday morning before you see Dr. Darrold JunkerParaschos.  Continue taking all of your other medications as prescribed except for the metoprolol succinate.  Return to the emergency department if you develop new or worsening symptoms that concern you.

## 2017-12-07 DIAGNOSIS — I5033 Acute on chronic diastolic (congestive) heart failure: Secondary | ICD-10-CM | POA: Diagnosis not present

## 2017-12-07 DIAGNOSIS — I10 Essential (primary) hypertension: Secondary | ICD-10-CM | POA: Diagnosis not present

## 2017-12-07 DIAGNOSIS — E7849 Other hyperlipidemia: Secondary | ICD-10-CM | POA: Diagnosis not present

## 2017-12-07 DIAGNOSIS — I2510 Atherosclerotic heart disease of native coronary artery without angina pectoris: Secondary | ICD-10-CM | POA: Diagnosis not present

## 2017-12-07 DIAGNOSIS — R079 Chest pain, unspecified: Secondary | ICD-10-CM | POA: Diagnosis not present

## 2017-12-07 DIAGNOSIS — E119 Type 2 diabetes mellitus without complications: Secondary | ICD-10-CM | POA: Diagnosis not present

## 2017-12-07 DIAGNOSIS — Z09 Encounter for follow-up examination after completed treatment for conditions other than malignant neoplasm: Secondary | ICD-10-CM | POA: Diagnosis not present

## 2017-12-07 DIAGNOSIS — I161 Hypertensive emergency: Secondary | ICD-10-CM | POA: Diagnosis not present

## 2017-12-09 ENCOUNTER — Other Ambulatory Visit: Payer: Self-pay

## 2017-12-09 NOTE — Patient Outreach (Signed)
[facility]) Care Management  12/09/2017  Xavien Dauphinais Sparks 1940-10-08 [id number]   Referral Date: 12/09/17 Referral Source: [facility] Referral Reason: Medication co-pay assistance   Outreach Attempt: no answer.  HIPAA compliant voice message left.   Plan: RN CM will send letter and attempt again within 4 business days.     [name], RN, MSN [facility] Care Management Care Management Coordinator Direct Line [phone] Toll Free: [phone]  Fax: [phone]

## 2017-12-13 ENCOUNTER — Other Ambulatory Visit: Payer: Self-pay

## 2017-12-13 NOTE — Patient Outreach (Signed)
[facility]) Care Management  12/13/2017  Junius Faucett Bevens 28-Jul-1940 [id number]   Referral Date: 12/09/17 Referral Source: [facility] Referral Reason: Medication co-pay assistance   Outreach Attempt: no answer.  HIPAA compliant voice message left.   Plan: RN CM will attempt again within 4 business days.    [name], RN, MSN [facility] Care Management Care Management Coordinator Direct Line [phone] Cell [phone] Toll Free: [phone]  Fax: [phone]

## 2017-12-14 ENCOUNTER — Other Ambulatory Visit: Payer: Self-pay

## 2017-12-14 NOTE — Patient Outreach (Signed)
[facility]) Care Management  12/14/2017  Christopher Snow Jul 14, 1940 [id number]   Referral Date:12/09/17 Referral Source:[facility] Referral Reason:Medication co-pay assistance   Outreach Attempt:no answer. HIPAA compliant voice message left.   Plan:RN CM will wait return call. If no return call will close case.  [name], RN, MSN [facility] Care Management Care Management Coordinator Direct Line [phone] Cell [phone] Toll Free: [phone]  Fax: [phone]

## 2017-12-16 ENCOUNTER — Ambulatory Visit: Payer: Self-pay

## 2017-12-22 DIAGNOSIS — Z23 Encounter for immunization: Secondary | ICD-10-CM | POA: Diagnosis not present

## 2017-12-22 DIAGNOSIS — E7849 Other hyperlipidemia: Secondary | ICD-10-CM | POA: Diagnosis not present

## 2017-12-22 DIAGNOSIS — I10 Essential (primary) hypertension: Secondary | ICD-10-CM | POA: Diagnosis not present

## 2017-12-23 ENCOUNTER — Other Ambulatory Visit: Payer: Self-pay

## 2017-12-23 NOTE — Patient Outreach (Signed)
[facility]) Care Management  12/23/2017  Christopher Snow 10/12/1940 [id number]   Multiple attempts to establish contact with patient without success. No response from letter mailed to patient.   Plan: RN CM will close case at this time.   [name], RN, MSN [facility] Care Management Care Management Coordinator Direct Line [phone] Cell [phone] Toll Free: [phone]  Fax: [phone]

## 2018-01-13 DIAGNOSIS — E119 Type 2 diabetes mellitus without complications: Secondary | ICD-10-CM | POA: Diagnosis not present

## 2018-01-14 DIAGNOSIS — J209 Acute bronchitis, unspecified: Secondary | ICD-10-CM | POA: Diagnosis not present

## 2018-01-14 DIAGNOSIS — J019 Acute sinusitis, unspecified: Secondary | ICD-10-CM | POA: Diagnosis not present

## 2018-01-14 DIAGNOSIS — B9689 Other specified bacterial agents as the cause of diseases classified elsewhere: Secondary | ICD-10-CM | POA: Diagnosis not present

## 2018-07-14 ENCOUNTER — Ambulatory Visit: Payer: PPO | Admitting: Podiatry

## 2018-08-25 ENCOUNTER — Other Ambulatory Visit: Payer: Self-pay

## 2018-08-25 ENCOUNTER — Encounter: Payer: Self-pay | Admitting: Podiatry

## 2018-08-25 ENCOUNTER — Ambulatory Visit (INDEPENDENT_AMBULATORY_CARE_PROVIDER_SITE_OTHER): Payer: Non-veteran care | Admitting: Podiatry

## 2018-08-25 DIAGNOSIS — M79675 Pain in left toe(s): Secondary | ICD-10-CM | POA: Diagnosis not present

## 2018-08-25 DIAGNOSIS — M79674 Pain in right toe(s): Secondary | ICD-10-CM

## 2018-08-25 DIAGNOSIS — B351 Tinea unguium: Secondary | ICD-10-CM

## 2018-08-25 DIAGNOSIS — E1151 Type 2 diabetes mellitus with diabetic peripheral angiopathy without gangrene: Secondary | ICD-10-CM | POA: Diagnosis not present

## 2018-08-25 DIAGNOSIS — D689 Coagulation defect, unspecified: Secondary | ICD-10-CM | POA: Diagnosis not present

## 2018-08-25 NOTE — Progress Notes (Signed)
This patient presents to the office with chief complaint of long thick nails and diabetic feet.  This patient  says there  is  no pain and discomfort in his feet.   He says he wears diabetic shoes from the [place]. This patient says there are long thick painful nails.  These nails are painful walking and wearing shoes.  Patient has no history of infection or drainage from both feet.  Patient is unable to  self treat his own nails . This patient presents  to the office today for treatment of the  long nails and a foot evaluation due to history of  diabetes.  General Appearance  Alert, conversant and in no acute stress.  Vascular  Dorsalis pedis and posterior tibial  pulses are palpable  bilaterally.  Capillary return is within normal limits  bilaterally. Temperature is within normal limits  bilaterally.  Neurologic  Senn-Weinstein monofilament wire test within normal limits  bilaterally. Muscle power within normal limits bilaterally.  Nails Thick disfigured discolored nails with subungual debris  from hallux to fifth toes bilaterally. No evidence of bacterial infection or drainage bilaterally.  Orthopedic  No limitations of motion of motion feet .  No crepitus or effusions noted.  No bony pathology or digital deformities noted.  Skin  normotropic skin with no porokeratosis noted bilaterally.  No signs of infections or ulcers noted.     Onychomycosis  Diabetes with no foot complications  IE  Debride nails x 10.  A diabetic foot exam was performed and there is no evidence of any vascular or neurologic pathology.   RTC 3 months.   Gardiner Barefoot DPM

## 2018-08-26 ENCOUNTER — Other Ambulatory Visit: Payer: Self-pay

## 2018-08-26 ENCOUNTER — Emergency Department: Payer: No Typology Code available for payment source

## 2018-08-26 ENCOUNTER — Encounter: Payer: Self-pay | Admitting: Emergency Medicine

## 2018-08-26 ENCOUNTER — Inpatient Hospital Stay
Admission: EM | Admit: 2018-08-26 | Discharge: 2018-08-29 | DRG: 291 | Disposition: A | Discharge status: Home / Self Care | Payer: No Typology Code available for payment source | Source: Ambulatory Visit | Attending: Internal Medicine | Admitting: Internal Medicine

## 2018-08-26 DIAGNOSIS — I252 Old myocardial infarction: Secondary | ICD-10-CM

## 2018-08-26 DIAGNOSIS — J449 Chronic obstructive pulmonary disease, unspecified: Secondary | ICD-10-CM | POA: Diagnosis not present

## 2018-08-26 DIAGNOSIS — I509 Heart failure, unspecified: Secondary | ICD-10-CM | POA: Diagnosis not present

## 2018-08-26 DIAGNOSIS — B351 Tinea unguium: Secondary | ICD-10-CM | POA: Diagnosis present

## 2018-08-26 DIAGNOSIS — E785 Hyperlipidemia, unspecified: Secondary | ICD-10-CM | POA: Diagnosis present

## 2018-08-26 DIAGNOSIS — I2510 Atherosclerotic heart disease of native coronary artery without angina pectoris: Secondary | ICD-10-CM | POA: Diagnosis present

## 2018-08-26 DIAGNOSIS — I70208 Unspecified atherosclerosis of native arteries of extremities, other extremity: Secondary | ICD-10-CM | POA: Diagnosis present

## 2018-08-26 DIAGNOSIS — N182 Chronic kidney disease, stage 2 (mild): Secondary | ICD-10-CM | POA: Diagnosis not present

## 2018-08-26 DIAGNOSIS — I5043 Acute on chronic combined systolic (congestive) and diastolic (congestive) heart failure: Secondary | ICD-10-CM | POA: Diagnosis present

## 2018-08-26 DIAGNOSIS — E1122 Type 2 diabetes mellitus with diabetic chronic kidney disease: Secondary | ICD-10-CM | POA: Diagnosis present

## 2018-08-26 DIAGNOSIS — I10 Essential (primary) hypertension: Secondary | ICD-10-CM | POA: Diagnosis not present

## 2018-08-26 DIAGNOSIS — I255 Ischemic cardiomyopathy: Secondary | ICD-10-CM | POA: Diagnosis present

## 2018-08-26 DIAGNOSIS — J9601 Acute respiratory failure with hypoxia: Secondary | ICD-10-CM | POA: Diagnosis not present

## 2018-08-26 DIAGNOSIS — E871 Hypo-osmolality and hyponatremia: Secondary | ICD-10-CM | POA: Diagnosis present

## 2018-08-26 DIAGNOSIS — E875 Hyperkalemia: Secondary | ICD-10-CM | POA: Diagnosis present

## 2018-08-26 DIAGNOSIS — E1151 Type 2 diabetes mellitus with diabetic peripheral angiopathy without gangrene: Secondary | ICD-10-CM | POA: Diagnosis present

## 2018-08-26 DIAGNOSIS — Z8249 Family history of ischemic heart disease and other diseases of the circulatory system: Secondary | ICD-10-CM

## 2018-08-26 DIAGNOSIS — Z7984 Long term (current) use of oral hypoglycemic drugs: Secondary | ICD-10-CM | POA: Diagnosis not present

## 2018-08-26 DIAGNOSIS — I130 Hypertensive heart and chronic kidney disease with heart failure and stage 1 through stage 4 chronic kidney disease, or unspecified chronic kidney disease: Secondary | ICD-10-CM | POA: Diagnosis not present

## 2018-08-26 DIAGNOSIS — I5020 Unspecified systolic (congestive) heart failure: Secondary | ICD-10-CM | POA: Diagnosis not present

## 2018-08-26 DIAGNOSIS — Z833 Family history of diabetes mellitus: Secondary | ICD-10-CM

## 2018-08-26 DIAGNOSIS — R001 Bradycardia, unspecified: Secondary | ICD-10-CM | POA: Diagnosis not present

## 2018-08-26 DIAGNOSIS — Z951 Presence of aortocoronary bypass graft: Secondary | ICD-10-CM

## 2018-08-26 DIAGNOSIS — R9431 Abnormal electrocardiogram [ECG] [EKG]: Secondary | ICD-10-CM | POA: Diagnosis not present

## 2018-08-26 DIAGNOSIS — Z87891 Personal history of nicotine dependence: Secondary | ICD-10-CM

## 2018-08-26 DIAGNOSIS — Z79899 Other long term (current) drug therapy: Secondary | ICD-10-CM | POA: Diagnosis not present

## 2018-08-26 DIAGNOSIS — R0602 Shortness of breath: Secondary | ICD-10-CM

## 2018-08-26 DIAGNOSIS — E872 Acidosis: Secondary | ICD-10-CM | POA: Diagnosis present

## 2018-08-26 DIAGNOSIS — Z20828 Contact with and (suspected) exposure to other viral communicable diseases: Secondary | ICD-10-CM | POA: Diagnosis present

## 2018-08-26 DIAGNOSIS — Z7951 Long term (current) use of inhaled steroids: Secondary | ICD-10-CM | POA: Diagnosis not present

## 2018-08-26 DIAGNOSIS — E119 Type 2 diabetes mellitus without complications: Secondary | ICD-10-CM | POA: Diagnosis not present

## 2018-08-26 DIAGNOSIS — Z888 Allergy status to other drugs, medicaments and biological substances status: Secondary | ICD-10-CM

## 2018-08-26 LAB — COMPREHENSIVE METABOLIC PANEL
ALT: 30 U/L (ref 0–44)
AST: 35 U/L (ref 15–41)
Albumin: 4.3 g/dL (ref 3.5–5.0)
Alkaline Phosphatase: 104 U/L (ref 38–126)
Anion gap: 12 (ref 5–15)
BUN: 28 mg/dL — ABNORMAL HIGH (ref 8–23)
CO2: 17 mmol/L — ABNORMAL LOW (ref 22–32)
Calcium: 8.8 mg/dL — ABNORMAL LOW (ref 8.9–10.3)
Chloride: 99 mmol/L (ref 98–111)
Creatinine, Ser: 1.33 mg/dL — ABNORMAL HIGH (ref 0.61–1.24)
GFR calc Af Amer: 58 mL/min — ABNORMAL LOW (ref >60)
GFR calc non Af Amer: 50 mL/min — ABNORMAL LOW (ref >60)
Glucose, Bld: 244 mg/dL — ABNORMAL HIGH (ref 70–99)
Potassium: 6.5 mmol/L (ref 3.5–5.1)
Sodium: 128 mmol/L — ABNORMAL LOW (ref 135–145)
Total Bilirubin: 1.1 mg/dL (ref 0.3–1.2)
Total Protein: 7.4 g/dL (ref 6.5–8.1)

## 2018-08-26 LAB — CBC WITH DIFFERENTIAL/PLATELET
Abs Immature Granulocytes: 0.12 10*3/uL — ABNORMAL HIGH (ref 0.00–0.07)
Basophils Absolute: 0 10*3/uL (ref 0.0–0.1)
Basophils Relative: 0 %
Eosinophils Absolute: 0 10*3/uL (ref 0.0–0.5)
Eosinophils Relative: 0 %
HCT: 40.2 % (ref 39.0–52.0)
Hemoglobin: 13.4 g/dL (ref 13.0–17.0)
Immature Granulocytes: 1 %
Lymphocytes Relative: 13 %
Lymphs Abs: 1.2 10*3/uL (ref 0.7–4.0)
MCH: 29.8 pg (ref 26.0–34.0)
MCHC: 33.3 g/dL (ref 30.0–36.0)
MCV: 89.5 fL (ref 80.0–100.0)
Monocytes Absolute: 0.5 10*3/uL (ref 0.1–1.0)
Monocytes Relative: 5 %
Neutro Abs: 7.8 10*3/uL — ABNORMAL HIGH (ref 1.7–7.7)
Neutrophils Relative %: 81 %
Platelets: 189 10*3/uL (ref 150–400)
RBC: 4.49 MIL/uL (ref 4.22–5.81)
RDW: 14.5 % (ref 11.5–15.5)
WBC: 9.6 10*3/uL (ref 4.0–10.5)
nRBC: 0 % (ref 0.0–0.2)

## 2018-08-26 LAB — GLUCOSE, CAPILLARY
Glucose-Capillary: 249 mg/dL — ABNORMAL HIGH (ref 70–99)
Glucose-Capillary: 295 mg/dL — ABNORMAL HIGH (ref 70–99)
Glucose-Capillary: 265 mg/dL — ABNORMAL HIGH (ref 70–99)

## 2018-08-26 LAB — HEMOGLOBIN A1C
Hgb A1c MFr Bld: 7.7 % — ABNORMAL HIGH (ref 4.8–5.6)
Mean Plasma Glucose: 174.29 mg/dL

## 2018-08-26 LAB — LACTIC ACID, PLASMA
Lactic Acid, Venous: 2.4 mmol/L (ref 0.5–1.9)
Lactic Acid, Venous: 3.7 mmol/L (ref 0.5–1.9)

## 2018-08-26 LAB — TROPONIN I
Troponin I: <0.03 ng/mL (ref <0.03)
Troponin I: <0.03 ng/mL (ref <0.03)

## 2018-08-26 LAB — POTASSIUM: Potassium: 4.5 mmol/L (ref 3.5–5.1)

## 2018-08-26 LAB — BRAIN NATRIURETIC PEPTIDE: B Natriuretic Peptide: 1711 pg/mL — ABNORMAL HIGH (ref 0.0–100.0)

## 2018-08-26 LAB — SARS CORONAVIRUS 2 BY RT PCR (HOSPITAL ORDER, PERFORMED IN ~~LOC~~ HOSPITAL LAB): SARS Coronavirus 2: NEGATIVE

## 2018-08-26 LAB — MAGNESIUM: Magnesium: 1.7 mg/dL (ref 1.7–2.4)

## 2018-08-26 MED ORDER — ENOXAPARIN SODIUM 40 MG/0.4ML ~~LOC~~ SOLN
40.0000 mg | SUBCUTANEOUS | Status: DC
  Administered 2018-08-26 – 2018-08-28 (×3): 40 mg via SUBCUTANEOUS
  Filled 2018-08-26 (×3): qty 0.4

## 2018-08-26 MED ORDER — ASPIRIN EC 81 MG PO TBEC
81.0000 mg | DELAYED_RELEASE_TABLET | Freq: Every day | ORAL | Status: DC
  Administered 2018-08-27 – 2018-08-29 (×3): 81 mg via ORAL
  Filled 2018-08-26 (×3): qty 1

## 2018-08-26 MED ORDER — CALCIUM GLUCONATE-NACL 1-0.675 GM/50ML-% IV SOLN
1.0000 g | Freq: Once | INTRAVENOUS | Status: AC
  Administered 2018-08-26: 1000 mg via INTRAVENOUS
  Filled 2018-08-26: qty 50

## 2018-08-26 MED ORDER — SODIUM ZIRCONIUM CYCLOSILICATE 10 G PO PACK
10.0000 g | PACK | Freq: Once | ORAL | Status: AC
  Administered 2018-08-26: 10 g via ORAL
  Filled 2018-08-26: qty 1

## 2018-08-26 MED ORDER — NITROGLYCERIN 0.4 MG SL SUBL: 0.4000 mg | SUBLINGUAL_TABLET | SUBLINGUAL | Status: DC | PRN

## 2018-08-26 MED ORDER — DEXTROSE 50 % IV SOLN
1.0000 | Freq: Once | INTRAVENOUS | Status: AC
  Administered 2018-08-26: 50 mL via INTRAVENOUS
  Filled 2018-08-26: qty 50

## 2018-08-26 MED ORDER — ALPRAZOLAM 0.5 MG PO TABS: 0.2500 mg | ORAL_TABLET | Freq: Two times a day (BID) | ORAL | Status: DC | PRN

## 2018-08-26 MED ORDER — FUROSEMIDE 10 MG/ML IJ SOLN
40.0000 mg | Freq: Once | INTRAMUSCULAR | Status: AC
  Administered 2018-08-26: 40 mg via INTRAVENOUS
  Filled 2018-08-26: qty 4

## 2018-08-26 MED ORDER — ALBUTEROL SULFATE (2.5 MG/3ML) 0.083% IN NEBU
2.5000 mg | INHALATION_SOLUTION | RESPIRATORY_TRACT | Status: DC | PRN
  Administered 2018-08-27: 2.5 mg via RESPIRATORY_TRACT
  Filled 2018-08-26: qty 3

## 2018-08-26 MED ORDER — SODIUM BICARBONATE 8.4 % IV SOLN
50.0000 meq | Freq: Once | INTRAVENOUS | Status: AC
  Administered 2018-08-26: 50 meq via INTRAVENOUS
  Filled 2018-08-26: qty 50

## 2018-08-26 MED ORDER — FUROSEMIDE 10 MG/ML IJ SOLN
20.0000 mg | Freq: Two times a day (BID) | INTRAMUSCULAR | Status: DC
  Administered 2018-08-27 – 2018-08-29 (×5): 20 mg via INTRAVENOUS
  Filled 2018-08-26 (×5): qty 2

## 2018-08-26 MED ORDER — MORPHINE SULFATE (PF) 2 MG/ML IV SOLN: 2.0000 mg | INTRAVENOUS | Status: DC | PRN

## 2018-08-26 MED ORDER — SODIUM CHLORIDE 0.9% FLUSH: 3.0000 mL | INTRAVENOUS | Status: DC | PRN

## 2018-08-26 MED ORDER — INSULIN ASPART 100 UNIT/ML ~~LOC~~ SOLN
0.0000 [IU] | Freq: Three times a day (TID) | SUBCUTANEOUS | Status: DC
  Administered 2018-08-27: 5 [IU] via SUBCUTANEOUS
  Administered 2018-08-27: 2 [IU] via SUBCUTANEOUS
  Administered 2018-08-27 – 2018-08-28 (×2): 5 [IU] via SUBCUTANEOUS
  Administered 2018-08-29: 3 [IU] via SUBCUTANEOUS
  Filled 2018-08-26 (×5): qty 1

## 2018-08-26 MED ORDER — INSULIN ASPART 100 UNIT/ML ~~LOC~~ SOLN
0.0000 [IU] | Freq: Every day | SUBCUTANEOUS | Status: DC
  Administered 2018-08-26: 3 [IU] via SUBCUTANEOUS
  Administered 2018-08-28: 2 [IU] via SUBCUTANEOUS
  Filled 2018-08-26 (×2): qty 1

## 2018-08-26 MED ORDER — PANTOPRAZOLE SODIUM 40 MG IV SOLR
40.0000 mg | INTRAVENOUS | Status: DC
  Administered 2018-08-26 – 2018-08-27 (×2): 40 mg via INTRAVENOUS
  Filled 2018-08-26 (×2): qty 40

## 2018-08-26 MED ORDER — GLIPIZIDE 10 MG PO TABS
10.0000 mg | ORAL_TABLET | Freq: Two times a day (BID) | ORAL | Status: DC
  Administered 2018-08-27 – 2018-08-28 (×3): 10 mg via ORAL
  Filled 2018-08-26 (×4): qty 1

## 2018-08-26 MED ORDER — SODIUM CHLORIDE 0.9% FLUSH
3.0000 mL | Freq: Two times a day (BID) | INTRAVENOUS | Status: DC
  Administered 2018-08-26 – 2018-08-29 (×6): 3 mL via INTRAVENOUS

## 2018-08-26 MED ORDER — BENZONATATE 100 MG PO CAPS: 200.0000 mg | ORAL_CAPSULE | Freq: Three times a day (TID) | ORAL | Status: DC | PRN

## 2018-08-26 MED ORDER — ATORVASTATIN CALCIUM 10 MG PO TABS
5.0000 mg | ORAL_TABLET | Freq: Every day | ORAL | Status: DC
  Administered 2018-08-27 – 2018-08-28 (×2): 5 mg via ORAL
  Filled 2018-08-26 (×2): qty 1

## 2018-08-26 MED ORDER — BUDESONIDE 0.5 MG/2ML IN SUSP
0.5000 mg | Freq: Two times a day (BID) | RESPIRATORY_TRACT | Status: DC
  Administered 2018-08-26 – 2018-08-29 (×6): 0.5 mg via RESPIRATORY_TRACT
  Filled 2018-08-26 (×6): qty 2

## 2018-08-26 MED ORDER — FLUTICASONE PROPIONATE 50 MCG/ACT NA SUSP
2.0000 | Freq: Every day | NASAL | Status: DC | PRN
  Filled 2018-08-26: qty 16

## 2018-08-26 MED ORDER — INSULIN ASPART 100 UNIT/ML ~~LOC~~ SOLN
8.0000 [IU] | Freq: Once | SUBCUTANEOUS | Status: AC
  Administered 2018-08-26: 8 [IU] via SUBCUTANEOUS
  Filled 2018-08-26: qty 1

## 2018-08-26 MED ORDER — SODIUM CHLORIDE 0.9 % IV SOLN: 250.0000 mL | INTRAVENOUS | Status: DC | PRN

## 2018-08-26 MED ORDER — ONDANSETRON HCL 4 MG/2ML IJ SOLN: 4.0000 mg | Freq: Four times a day (QID) | INTRAMUSCULAR | Status: DC | PRN

## 2018-08-26 MED ORDER — IPRATROPIUM-ALBUTEROL 0.5-2.5 (3) MG/3ML IN SOLN
3.0000 mL | Freq: Three times a day (TID) | RESPIRATORY_TRACT | Status: AC
  Administered 2018-08-26 – 2018-08-27 (×3): 3 mL via RESPIRATORY_TRACT
  Filled 2018-08-26 (×3): qty 3

## 2018-08-26 MED ORDER — ISOSORBIDE MONONITRATE ER 60 MG PO TB24
30.0000 mg | ORAL_TABLET | Freq: Every day | ORAL | Status: DC
  Administered 2018-08-27 – 2018-08-29 (×3): 30 mg via ORAL
  Filled 2018-08-26 (×3): qty 1

## 2018-08-26 MED ORDER — ACETAMINOPHEN 325 MG PO TABS: 650.0000 mg | ORAL_TABLET | ORAL | Status: DC | PRN

## 2018-08-26 MED ORDER — HYDRALAZINE HCL 20 MG/ML IJ SOLN: 10.0000 mg | INTRAMUSCULAR | Status: DC | PRN

## 2018-08-26 MED ORDER — LOSARTAN POTASSIUM 50 MG PO TABS
50.0000 mg | ORAL_TABLET | Freq: Every day | ORAL | Status: DC
  Administered 2018-08-27 – 2018-08-29 (×3): 50 mg via ORAL
  Filled 2018-08-26 (×3): qty 1

## 2018-08-26 NOTE — ED Notes (Signed)
Blood glucose 295

## 2018-08-26 NOTE — ED Notes (Signed)
ED TO INPATIENT HANDOFF REPORT  ED Nurse Name and Phone #:    S Name/Age/Gender Miles CostainPaul Douglas Manske 78 y.o. male Room/Bed: ED06A/ED06A  Code Status   Code Status: Prior  Home/SNF/Other Home Patient oriented to: self, place, time and situation Is this baseline? Yes   Triage Complete: Triage complete  Chief Complaint Abnormal Lab  Triage Note [facility] potassium greater than 6 and heart rate in the 40s.  [facility] shortness of breath, occasional dizziness, and generalized weakness.     Allergies Allergies  Allergen Reactions  . Statins Other (See Comments)    Level of Care/Admitting Diagnosis ED Disposition    ED Disposition Condition Comment   Admit  Hospital Area: [facility] [[id number]]  Level of Care: Telemetry [5]  Covid Evaluation: Confirmed COVID Negative  Diagnosis: CHF (congestive heart failure) [facility](HCC) [[id number]][[id number]]  Admitting Physician: Bertrum SolSALARY, MONTELL D [[id number]]  Attending Physician: Bertrum SolSALARY, MONTELL D [[id number]]  Estimated length of stay: past midnight tomorrow  Certification:: I certify this patient will need inpatient services for at least 2 midnights  PT Class (Do Not Modify): Inpatient [101]  PT Acc Code (Do Not Modify): Private [1]       B Medical/Surgery History Past Medical History:  Diagnosis Date  . Diabetes mellitus without complication (HCC)   . Heart attack (HCC)   . Hyperlipidemia   . Hypertension    Past Surgical History:  Procedure Laterality Date  . APPENDECTOMY    . CARDIAC SURGERY     five bypass  . CORONARY ARTERY BYPASS GRAFT    . HERNIA REPAIR    . PR VEIN BYPASS GRAFT,AORTO-FEM-POP       A IV Location/Drains/Wounds Patient Lines/Drains/Airways Status   Active Line/Drains/Airways    Name:   Placement date:   Placement time:   Site:   Days:   Peripheral IV 08/26/18 Left Antecubital   08/26/18    1736    Antecubital   less than 1   Peripheral IV 08/26/18 Right  Antecubital   08/26/18    1908    Antecubital   less than 1          Intake/Output Last 24 hours No intake or output data in the 24 hours ending 08/26/18 2008  Labs/Imaging Results for orders placed or performed during the hospital encounter of 08/26/18 (from the past 48 hour(s))  CBC with Differential     Status: Abnormal   Collection Time: 08/26/18  5:37 PM  Result Value Ref Range   WBC 9.6 4.0 - 10.5 K/uL   RBC 4.49 4.22 - 5.81 MIL/uL   Hemoglobin 13.4 13.0 - 17.0 g/dL   HCT 29.540.2 62.139.0 - 30.852.0 %   MCV 89.5 80.0 - 100.0 fL   MCH 29.8 26.0 - 34.0 pg   MCHC 33.3 30.0 - 36.0 g/dL   RDW 65.714.5 84.611.5 - 96.215.5 %   Platelets 189 150 - 400 K/uL   nRBC 0.0 0.0 - 0.2 %   Neutrophils Relative % 81 %   Neutro Abs 7.8 (H) 1.7 - 7.7 K/uL   Lymphocytes Relative 13 %   Lymphs Abs 1.2 0.7 - 4.0 K/uL   Monocytes Relative 5 %   Monocytes Absolute 0.5 0.1 - 1.0 K/uL   Eosinophils Relative 0 %   Eosinophils Absolute 0.0 0.0 - 0.5 K/uL   Basophils Relative 0 %   Basophils Absolute 0.0 0.0 - 0.1 K/uL   Immature  Granulocytes 1 %   Abs Immature Granulocytes 0.12 (H) 0.00 - 0.07 K/uL    Comment: Performed at [facility], [address] [place], [place] [address]  Comprehensive metabolic panel     Status: Abnormal   Collection Time: 08/26/18  5:37 PM  Result Value Ref Range   Sodium 128 (L) 135 - 145 mmol/L   Potassium 6.5 (HH) 3.5 - 5.1 mmol/L    Comment: CRITICAL RESULT CALLED TO, READ BACK BY AND VERIFIED WITH JEANETTE PEREZ AT 6045 08/26/2018.PMF   Chloride 99 98 - 111 mmol/L   CO2 17 (L) 22 - 32 mmol/L   Glucose, Bld 244 (H) 70 - 99 mg/dL   BUN 28 (H) 8 - 23 mg/dL   Creatinine, Ser 1.33 (H) 0.61 - 1.24 mg/dL   Calcium 8.8 (L) 8.9 - 10.3 mg/dL   Total Protein 7.4 6.5 - 8.1 g/dL   Albumin 4.3 3.5 - 5.0 g/dL   AST 35 15 - 41 U/L   ALT 30 0 - 44 U/L   Alkaline Phosphatase 104 38 - 126 U/L   Total Bilirubin 1.1 0.3 - 1.2 mg/dL   GFR calc non Af Amer 50 (L) >60 mL/min   GFR calc Af  Amer 58 (L) >60 mL/min   Anion gap 12 5 - 15    Comment: Performed at [facility], [address] [place], [place] [address]  Troponin I - ONCE - STAT     Status: None   Collection Time: 08/26/18  5:37 PM  Result Value Ref Range   Troponin I <0.03 <0.03 ng/mL    Comment: Performed at [facility], [address] [place], [place] [address]  Magnesium     Status: None   Collection Time: 08/26/18  5:37 PM  Result Value Ref Range   Magnesium 1.7 1.7 - 2.4 mg/dL    Comment: Performed at [facility], [address] [place], [place] [address]  Brain natriuretic peptide     Status: Abnormal   Collection Time: 08/26/18  6:11 PM  Result Value Ref Range   B Natriuretic Peptide 1,711.0 (H) 0.0 - 100.0 pg/mL    Comment: Performed at [facility], [address] [place],  [address]  SARS Coronavirus 2 (CEPHEID - Performed in [place] hospital lab), Hosp Order     Status: None   Collection Time: 08/26/18  6:12 PM   Specimen: Nasopharyngeal Swab  Result Value Ref Range   SARS Coronavirus 2 NEGATIVE NEGATIVE    Comment: (NOTE) If result is NEGATIVE SARS-CoV-2 target nucleic acids are NOT DETECTED. The SARS-CoV-2 RNA is generally detectable in upper and lower  respiratory specimens during the acute phase of infection. The lowest  concentration of SARS-CoV-2 viral copies this assay can detect is 250  copies / mL. A negative result does not preclude SARS-CoV-2 infection  and should not be used as the sole basis for treatment or other  patient management decisions.  A negative result may occur with  improper specimen collection / handling, submission of specimen other  than nasopharyngeal swab, presence of viral mutation(s) within the  areas targeted by this assay, and inadequate number of viral copies  (<250 copies / mL). A negative result must be combined with clinical  observations, patient history, and epidemiological information. If result  is POSITIVE SARS-CoV-2 target nucleic acids are DETECTED. The SARS-CoV-2 RNA is generally detectable in upper and lower  respiratory specimens dur ing the acute phase of infection.  Positive  results are indicative of active infection with SARS-CoV-2.  Clinical  correlation with patient history and other diagnostic information is  necessary to determine patient infection status.  Positive results do  not rule out bacterial infection or co-infection with other viruses. If result is PRESUMPTIVE POSTIVE SARS-CoV-2 nucleic acids MAY BE PRESENT.   A presumptive positive result was obtained on the submitted specimen  and confirmed on repeat testing.  While 2019 novel coronavirus  (SARS-CoV-2) nucleic acids may be present in the submitted sample  additional confirmatory testing may be necessary for epidemiological  and / or clinical management purposes  to differentiate between  SARS-CoV-2 and other Sarbecovirus currently known to infect humans.  If clinically indicated additional testing with an alternate test  methodology [phone]([id number]) is advised. The SARS-CoV-2 RNA is generally  detectable in upper and lower respiratory sp ecimens during the acute  phase of infection. The expected result is Negative. Fact Sheet for Patients:  BoilerBrush.com.cyhttps://www.fda.gov/media/136312/download Fact Sheet for Healthcare Providers: https://pope.com/https://www.fda.gov/media/136313/download This test is not yet approved or cleared by the [place] FDA and has been authorized for detection and/or diagnosis of SARS-CoV-2 by FDA under an Emergency Use Authorization (EUA).  This EUA will remain in effect (meaning this test can be used) for the duration of the COVID-19 declaration under Section 564(b)(1) of the Act, 21 U.S.C. section 360bbb-3(b)(1), unless the authorization is terminated or revoked sooner. Performed at [facility], [address] [place], [place] [address]   Glucose, capillary     Status: Abnormal    Collection Time: 08/26/18  6:55 PM  Result Value Ref Range   Glucose-Capillary 249 (H) 70 - 99 mg/dL  Lactic acid, plasma     Status: Abnormal   Collection Time: 08/26/18  7:07 PM  Result Value Ref Range   Lactic Acid, Venous 2.4 (HH) 0.5 - 1.9 mmol/L    Comment: CRITICAL RESULT CALLED TO, READ BACK BY AND VERIFIED WITH JEANETTE PEREZ AT 1941 ON 08/26/2018 [facility]. Performed at [facility], [address] [place], [place] [address]    Dg Chest MoraPort 1 View  Result Date: 08/26/2018 CLINICAL DATA:  [facility] greater than 6 and heart rate in the 40s. [facility] of [facility], occasional dizziness, and generalized weakness EXAM: PORTABLE CHEST 1 VIEW COMPARISON:  12/05/2017 FINDINGS: Stable changes from prior CABG surgery. Cardiac silhouette is top-normal in size. No mediastinal or hilar masses. No convincing adenopathy. There is opacity at the left lung base obscuring hemidiaphragm. This is new since the prior study. Prominent bronchovascular markings are noted bilaterally, stable. Lungs are otherwise clear. Possible small left and minimal right pleural effusions. No pneumothorax. Skeletal structures are grossly intact. IMPRESSION: 1. Left lung base opacity consistent with atelectasis or pneumonia. No convincing pulmonary edema. Electronically Signed   By: Amie Portlandavid  Ormond M.D.   On: 08/26/2018 18:45    Pending Labs Unresulted Labs (From admission, onward)    Start     Ordered   08/27/18 0500  Basic metabolic panel  Tomorrow morning,   STAT     08/26/18 1923   08/26/18 2200  Potassium  Once,   STAT     08/26/18 1923   08/26/18 1851  Lactic acid, plasma  Now then every 2 hours,   STAT     08/26/18 1850   08/26/18 1851  Culture, blood (routine x 2)  BLOOD CULTURE X 2,   STAT  08/26/18 1850   Signed and Held  Basic metabolic panel  Daily,   R     Signed and Held   Signed and Held  CBC  (enoxaparin (LOVENOX)    CrCl >/= 30  ml/min)  Once,   R    Comments: Baseline for enoxaparin therapy IF NOT ALREADY DRAWN.  Notify MD if PLT < 100 K.    Signed and Held   Signed and Held  Creatinine, serum  (enoxaparin (LOVENOX)    CrCl >/= 30 ml/min)  Once,   R    Comments: Baseline for enoxaparin therapy IF NOT ALREADY DRAWN.    Signed and Held   Signed and Held  Creatinine, serum  (enoxaparin (LOVENOX)    CrCl >/= 30 ml/min)  Weekly,   R    Comments: while on enoxaparin therapy    Signed and Held   Signed and Held  Hemoglobin A1c  Once,   R    Comments: To assess prior glycemic control    Signed and Held   Signed and Held  Troponin I - Now Then Q6H  Now then every 6 hours,   R     Signed and Held          Vitals/Pain Today's Vitals   08/26/18 1918 08/26/18 1930 08/26/18 2000 08/26/18 2005  BP: (!) 154/85 (!) 158/79 (!) 140/110   Pulse: 63 68 62   Resp: 17 20 (!) 24   Temp: 98.6 F (37 C)     TempSrc: Axillary     SpO2: 97% 96% 96%   Weight:      Height:      PainSc: 0-No pain   0-No pain    Isolation Precautions No active isolations  Medications Medications  calcium gluconate 1 g/ 50 mL sodium chloride IVPB (1,000 mg Intravenous New Bag/Given 08/26/18 2007)  ipratropium-albuterol (DUONEB) 0.5-2.5 (3) MG/3ML nebulizer solution 3 mL (has no administration in time range)  benzonatate (TESSALON) capsule 200 mg (has no administration in time range)  budesonide (PULMICORT) nebulizer solution 0.5 mg (has no administration in time range)  sodium zirconium cyclosilicate (LOKELMA) packet 10 g (10 g Oral Given 08/26/18 1947)  furosemide (LASIX) injection 40 mg (40 mg Intravenous Given 08/26/18 1903)  sodium bicarbonate injection 50 mEq (50 mEq Intravenous Given 08/26/18 1900)  insulin aspart (novoLOG) injection 8 Units (8 Units Subcutaneous Given 08/26/18 1904)  dextrose 50 % solution 50 mL (50 mLs Intravenous Given 08/26/18 1857)    Mobility walks Low fall risk   Focused Assessments Pulmonary Assessment  Handoff:  Lung sounds:   O2 Device: Nasal Cannula O2 Flow Rate (L/min): 2 L/min      R Recommendations: See Admitting Provider Note  Report given to:   Additional Notes:

## 2018-08-26 NOTE — ED Notes (Signed)
Reports sob that began last night worse today. Denies fever.

## 2018-08-26 NOTE — Progress Notes (Addendum)
CRITICAL VALUE ALERT  Critical Value:  Lactic 3.7  Date & Time Notied:  08/26/18 2220  Provider Notified: Dr. Jannifer Franklin   Orders Received/Actions taken: text page Dr. Jannifer Franklin, waiting to hear back from him

## 2018-08-26 NOTE — ED Notes (Signed)
Patient requesting something to eat. Ok to give crackers and juice as per md. Awaiting bed status.

## 2018-08-26 NOTE — ED Notes (Signed)
Patients wife called at home to provide update. Phone given to patient spoke with wife and let her know he was being admitted.

## 2018-08-26 NOTE — ED Provider Notes (Signed)
[facility] Emergency Department Provider Note  ____________________________________________   I have reviewed the triage vital signs and the nursing notes. Where available I have reviewed prior notes and, if possible and indicated, outside hospital notes.    HISTORY  Chief Complaint Shortness of Breath and Abnormal Lab    HPI Christopher Snow is a 78 y.o. male history of DM MI hyperlipidemia hypertension, history of CAD in the past, PAD, listed to have CHF however last echo that I can find was here September 2019 EF of 50-55% but ends of failure.  Patient states over the last week or so he is been getting more short of breath, having paroxysmal nocturnal dyspnea having to sit up to catch his breath, bilateral lower extremity edema, and no chest pain.  He went to his PCP and was found to have hyperkalemia, and was sent here for further evaluation.     Past Medical History:  Diagnosis Date  . Diabetes mellitus without complication (HCC)   . Heart attack (HCC)   . Hyperlipidemia   . Hypertension     Patient Active Problem List   Diagnosis Date Noted  . Pain due to onychomycosis of toenails of both feet 08/25/2018  . Coagulation disorder (HCC) 08/25/2018  . Hypertensive emergency 11/30/2017  . CAD (coronary artery disease) 03/07/2017  . Diabetes (HCC) 03/07/2017  . Atherosclerosis of native arteries of the extremities with ulceration (HCC) 03/04/2017  . Chest pain with high risk for cardiac etiology 03/06/2016  . Benign essential hypertension 07/17/2013  . CAD (coronary artery disease), native coronary artery 07/17/2013  . Controlled type 2 diabetes mellitus without complication, without long-term current use of insulin (HCC) 07/17/2013  . Hypertrophy of prostate without urinary obstruction and other lower urinary tract symptoms (LUTS) 07/17/2013  . Other hyperlipidemia 07/17/2013  . Other psoriasis 07/17/2013  . Peripheral vascular disease (HCC)  07/17/2013    Past Surgical History:  Procedure Laterality Date  . APPENDECTOMY    . CARDIAC SURGERY     five bypass  . CORONARY ARTERY BYPASS GRAFT    . HERNIA REPAIR    . PR VEIN BYPASS GRAFT,AORTO-FEM-POP      Prior to Admission medications   Medication Sig Start Date End Date Taking? Authorizing Provider  albuterol (VENTOLIN HFA) 108 (90 Base) MCG/ACT inhaler 2 puffs q.i.d. p.r.n. short of breath, wheezing, or cough 01/14/18   [provider]  amLODipine (NORVASC) 10 MG tablet  08/15/18   [provider]  aspirin EC 81 MG tablet Take 81 mg by mouth daily.    [provider]  atorvastatin (LIPITOR) 10 MG tablet Take 5 mg by mouth daily at 6 PM.    [provider]  fluticasone (FLONASE) 50 MCG/ACT nasal spray Place 2 sprays into both nostrils daily. 06/15/17   [provider]  glipiZIDE (GLUCOTROL) 10 MG tablet Take 10 mg by mouth 2 (two) times daily before a meal.  02/16/17   [provider]  hydrochlorothiazide (HYDRODIURIL) 25 MG tablet Take 1 tablet by mouth daily. 05/15/17   [provider]  isosorbide mononitrate (IMDUR) 30 MG 24 hr tablet TAKE ONE TABLET BY MOUTH ONCE DAILY 07/27/18   [provider]  losartan (COZAAR) 25 MG tablet Take 2 tablets (50 mg total) by mouth daily. 12/01/17   Enedina FinnerPatel, Sona, MD  metFORMIN (GLUCOPHAGE) 1000 MG tablet Take 1,000 mg by mouth 2 (two) times daily with a meal.    [provider]  metoprolol tartrate (LOPRESSOR)  25 MG tablet Take 1 tablet (25 mg total) by mouth 2 (two) times daily. 12/06/17 12/06/18  Hinda Kehr, MD  valACYclovir (VALTREX) 1000 MG tablet Take 1 tablet by mouth 3 (three) times daily. 06/15/17   [provider]    Allergies Statins  Family History  Problem Relation Age of Onset  . Heart disease Mother   . Heart disease Father   . Diabetes Maternal Grandmother   . Diabetes Maternal Grandfather     Social History Social History   Tobacco Use   . Smoking status: Former Research scientist (life sciences)  . Smokeless tobacco: Never Used  Substance Use Topics  . Alcohol use: No    Frequency: Never  . Drug use: No    Review of Systems Constitutional: No fever/chills Eyes: No visual changes. ENT: No sore throat. No stiff neck no neck pain Cardiovascular: Denies chest pain. Respiratory: + shortness of breath. Gastrointestinal:   no vomiting.  No diarrhea.  No constipation. Genitourinary: Negative for dysuria. Musculoskeletal: + lower extremity swelling Skin: Negative for rash. Neurological: Negative for severe headaches, focal weakness or numbness.   ____________________________________________   PHYSICAL EXAM:  VITAL SIGNS: ED Triage Vitals  Enc Vitals Group     BP 08/26/18 1656 129/77     Pulse Rate 08/26/18 1656 61     Resp 08/26/18 1656 20     Temp 08/26/18 1745 97.9 F (36.6 C)     Temp Source 08/26/18 1745 Rectal     SpO2 08/26/18 1656 97 %     Weight 08/26/18 1703 178 lb (80.7 kg)     Height 08/26/18 1703 6\' 3"  (1.905 m)     Head Circumference --      Peak Flow --      Pain Score 08/26/18 1702 0     Pain Loc --      Pain Edu? --      Excl. in [place]? --     Constitutional: Alert and oriented. Well appearing and in no acute distress. Eyes: Conjunctivae are normal Head: Atraumatic HEENT: No congestion/rhinnorhea. Mucous membranes are moist.  Oropharynx non-erythematous Neck:   Nontender with no meningismus, no masses, no stridor Cardiovascular: Normal rate, regular rhythm. Grossly normal heart sounds.  Good peripheral circulation. Respiratory: Normal respiratory effort.  No retractions. Lungs CTAB. Abdominal: Soft and nontender. No distention. No guarding no rebound Back:  There is no focal tenderness or step off.  there is no midline tenderness there are no lesions noted. there is no CVA tenderness Musculoskeletal: No lower extremity tenderness, no upper extremity tenderness. No joint effusions, positive bilateral lower extremity  edema no DVT signs Neurologic:  Normal speech and language. No gross focal neurologic deficits are appreciated.  Skin:  Skin is warm, dry and intact. No rash noted. Psychiatric: Mood and affect are normal. Speech and behavior are normal.  ____________________________________________   LABS (all labs ordered are listed, but only abnormal results are displayed)  Labs Reviewed  CBC WITH DIFFERENTIAL/PLATELET - Abnormal; Notable for the following components:      Result Value   Neutro Abs 7.8 (*)    Abs Immature Granulocytes 0.12 (*)    All other components within normal limits  COMPREHENSIVE METABOLIC PANEL - Abnormal; Notable for the following components:   Sodium 128 (*)    Potassium 6.5 (*)    CO2 17 (*)    Glucose, Bld 244 (*)    BUN 28 (*)    Creatinine, Ser 1.33 (*)    Calcium 8.8 (*)  GFR calc non Af Amer 50 (*)    GFR calc Af Amer 58 (*)    All other components within normal limits  BRAIN NATRIURETIC PEPTIDE - Abnormal; Notable for the following components:   B Natriuretic Peptide 1,711.0 (*)    All other components within normal limits  SARS CORONAVIRUS 2 (HOSPITAL ORDER, PERFORMED IN [facility])  TROPONIN I  MAGNESIUM  CBG MONITORING, ED  CBG MONITORING, ED  CBG MONITORING, ED  CBG MONITORING, ED  CBG MONITORING, ED  CBG MONITORING, ED  CBG MONITORING, ED  CBG MONITORING, ED  CBG MONITORING, ED  CBG MONITORING, ED  CBG MONITORING, ED  CBG MONITORING, ED    Pertinent labs  results that were available during my care of the patient were reviewed by me and considered in my medical decision making (see chart for details). ____________________________________________  EKG  I personally interpreted any EKGs ordered by me or triage Cardiogram shows sinus bradycardia rate 59, LAD noted, RBB noted, LAFB noted, diffuse ST changes consistent with right bundle branch block, QRS is 158, compared to prior there no significant  change. ____________________________________________  RADIOLOGY  Pertinent labs & imaging results that were available during my care of the patient were reviewed by me and considered in my medical decision making (see chart for details). If possible, patient and/or family made aware of any abnormal findings.  No results found. ____________________________________________    PROCEDURES  Procedure(s) performed: None  Procedures  Critical Care performed: CRITICAL CARE Performed by: Jeanmarie PlantJAMES A Jodye Scali   Total critical care time: 45 minutes  Critical care time was exclusive of separately billable procedures and treating other patients.  Critical care was necessary to treat or prevent imminent or life-threatening deterioration.  Critical care was time spent personally by me on the following activities: development of treatment plan with patient and/or surrogate as well as nursing, discussions with consultants, evaluation of patient's response to treatment, examination of patient, obtaining history from patient or surrogate, ordering and performing treatments and interventions, ordering and review of laboratory studies, ordering and review of radiographic studies, pulse oximetry and re-evaluation of patient's condition.   ____________________________________________   INITIAL IMPRESSION / ASSESSMENT AND PLAN / ED COURSE  Pertinent labs & imaging results that were available during my care of the patient were reviewed by me and considered in my medical decision making (see chart for details).  Here with history of what appears to be CHF symptoms with paroxysmal nocturnal dyspnea orthopnea leg swelling and exertional dyspnea, he has an elevated BNP and elevated potassium.  We are treat both of those entities, patient is in no acute distress, he will be admitted for further evaluation and work-up for this constellation of pathologies we have identified     ____________________________________________   FINAL CLINICAL IMPRESSION(S) / ED DIAGNOSES  Final diagnoses:  SOB (shortness of breath)  Hyperkalemia  Congestive heart failure, unspecified HF chronicity, unspecified heart failure type [facility](HCC)      This chart was dictated using voice recognition software.  Despite best efforts to proofread,  errors can occur which can change meaning.     Jeanmarie PlantMcShane, Tirsa Gail A, MD 08/26/18 [phone]

## 2018-08-26 NOTE — ED Triage Notes (Signed)
Patient presents to the ED from [facility] for potassium greater than 6 and heart rate in the 40s.  Patient came to [facility] due to shortness of breath, occasional dizziness, and generalized weakness.

## 2018-08-26 NOTE — ED Notes (Signed)
Blood glucose 249

## 2018-08-26 NOTE — ED Notes (Signed)
Patient up to bathroon hd large soft bowel movement. 400cc clear yellow urine output.

## 2018-08-26 NOTE — H&P (Signed)
[employer] - [place] at [facility]   PATIENT NAME: Christopher Snow    MR#:  [id number]  DATE OF BIRTH:  [birth year]  DATE OF ADMISSION:  08/26/2018  PRIMARY CARE PHYSICIAN: Gracelyn NurseJohnston, John D, MD   REQUESTING/REFERRING PHYSICIAN:   CHIEF COMPLAINT:   Chief Complaint  Patient presents with  . Shortness of Breath  . Abnormal Lab    HISTORY OF PRESENT ILLNESS: Christopher Spruceaul Padmanabhan  is a 78 y.o. male with a known history per below which includes diastolic congestive heart failure, presenting to his primary care provider earlier today with 1 week history of progressive shortness of breath, dyspnea on exertion, worsening swelling, generalized weakness, fatigue, dizziness, patient was noted to have high potassium level in the clinic setting with bradycardia into the 40s, patient sent to the ER for further work-up, noted potassium 6.5, creatinine 1.3, glucose 244, BNP greater than 1700, chest x-ray noted for atelectasis versus pneumonia, patient evaluated in the emergency room, no apparent distress, resting comfortably in bed, patient is now be admitted for acute hypoxic respiratory failure due to probable mild diastolic congestive heart failure exacerbation/bradycardia, and hyperkalemia.  PAST MEDICAL HISTORY:   Past Medical History:  Diagnosis Date  . Diabetes mellitus without complication (HCC)   . Heart attack (HCC)   . Hyperlipidemia   . Hypertension     PAST SURGICAL HISTORY:  Past Surgical History:  Procedure Laterality Date  . APPENDECTOMY    . CARDIAC SURGERY     five bypass  . CORONARY ARTERY BYPASS GRAFT    . HERNIA REPAIR    . PR VEIN BYPASS GRAFT,AORTO-FEM-POP      SOCIAL HISTORY:  Social History   Tobacco Use  . Smoking status: Former Games developermoker  . Smokeless tobacco: Never Used  Substance Use Topics  . Alcohol use: No    Frequency: Never    FAMILY HISTORY:  Family History  Problem Relation Age of Onset  . Heart disease Mother   . Heart disease Father   .  Diabetes Maternal Grandmother   . Diabetes Maternal Grandfather     DRUG ALLERGIES:  Allergies  Allergen Reactions  . Statins Other (See Comments)    REVIEW OF SYSTEMS:   CONSTITUTIONAL: No fever, +fatigue, weakness.  EYES: No blurred or double vision.  EARS, NOSE, AND THROAT: No tinnitus or ear pain.  RESPIRATORY: + cough, shortness of breath  CARDIOVASCULAR: No chest pain, + orthopnea, edema.  GASTROINTESTINAL: No nausea, vomiting, diarrhea or abdominal pain.  GENITOURINARY: No dysuria, hematuria.  ENDOCRINE: No polyuria, nocturia,  HEMATOLOGY: No anemia, easy bruising or bleeding SKIN: No rash or lesion. MUSCULOSKELETAL: No joint pain or arthritis.   NEUROLOGIC: No tingling, numbness, weakness.  PSYCHIATRY: No anxiety or depression.   MEDICATIONS AT HOME:  Prior to Admission medications   Medication Sig Start Date End Date Taking? Authorizing Provider  amLODipine (NORVASC) 10 MG tablet Take 10 mg by mouth daily.  08/15/18  Yes [provider]  aspirin EC 81 MG tablet Take 81 mg by mouth daily.   Yes [provider]  atorvastatin (LIPITOR) 10 MG tablet Take 5 mg by mouth daily at 6 PM.   Yes [provider]  glipiZIDE (GLUCOTROL) 10 MG tablet Take 10 mg by mouth 2 (two) times daily before a meal.  02/16/17  Yes [provider]  isosorbide mononitrate (IMDUR) 30 MG 24 hr tablet TAKE ONE TABLET BY MOUTH ONCE DAILY 07/27/18  Yes [provider]  losartan (COZAAR) 25 MG  tablet Take 2 tablets (50 mg total) by mouth daily. 12/01/17  Yes Enedina FinnerPatel, Sona, MD  metFORMIN (GLUCOPHAGE) 1000 MG tablet Take 1,000 mg by mouth 2 (two) times daily with a meal.   Yes [provider]  albuterol (VENTOLIN HFA) 108 (90 Base) MCG/ACT inhaler 2 puffs q.i.d. p.r.n. short of breath, wheezing, or cough 01/14/18   [provider]  fluticasone (FLONASE) 50 MCG/ACT nasal spray Place 2 sprays into both nostrils daily. 06/15/17   [provider]   metoprolol tartrate (LOPRESSOR) 25 MG tablet Take 1 tablet (25 mg total) by mouth 2 (two) times daily. Patient not taking: Reported on 08/26/2018 12/06/17 12/06/18  Loleta RoseForbach, Cory, MD  valACYclovir (VALTREX) 1000 MG tablet Take 1 tablet by mouth 3 (three) times daily. 06/15/17   [provider]      PHYSICAL EXAMINATION:   VITAL SIGNS: Blood pressure (!) 148/88, pulse (!) 55, temperature 97.9 F (36.6 C), temperature source Rectal, resp. rate 17, height 6\' 3"  (1.905 m), weight 80.7 kg, SpO2 97 %.  GENERAL:  78 y.o.-year-old patient lying in the bed with no acute distress.  Frail-appearing EYES: Pupils equal, round, reactive to light and accommodation. No scleral icterus. Extraocular muscles intact.  HEENT: Head atraumatic, normocephalic. Oropharynx and nasopharynx clear.  NECK:  Supple, no jugular venous distention. No thyroid enlargement, no tenderness.  LUNGS: Diminished breath sounds at bases bilaterally. No use of accessory muscles of respiration.  CARDIOVASCULAR: S1, S2 normal. No murmurs, rubs, or gallops.  ABDOMEN: Soft, nontender, nondistended. Bowel sounds present. No organomegaly or mass.  EXTREMITIES: Bilateral lower extremity edema, no cyanosis, or clubbing.  NEUROLOGIC: Cranial nerves II through XII are intact. Muscle strength 5/5 in all extremities. Sensation intact. Gait not checked.  PSYCHIATRIC: The patient is alert and oriented x 3.  SKIN: No obvious rash, lesion, or ulcer.   LABORATORY PANEL:   CBC Recent Labs  Lab 08/26/18 1737  WBC 9.6  HGB 13.4  HCT 40.2  PLT 189  MCV 89.5  MCH 29.8  MCHC 33.3  RDW 14.5  LYMPHSABS 1.2  MONOABS 0.5  EOSABS 0.0  BASOSABS 0.0   ------------------------------------------------------------------------------------------------------------------  Chemistries  Recent Labs  Lab 08/26/18 1737  NA 128*  K 6.5*  CL 99  CO2 17*  GLUCOSE 244*  BUN 28*  CREATININE 1.33*  CALCIUM 8.8*  MG 1.7  AST 35  ALT 30   ALKPHOS 104  BILITOT 1.1   ------------------------------------------------------------------------------------------------------------------ estimated creatinine clearance is 50.6 mL/min (A) (by C-G formula based on SCr of 1.33 mg/dL (H)). ------------------------------------------------------------------------------------------------------------------ No results for input(s): TSH, T4TOTAL, T3FREE, THYROIDAB in the last 72 hours.  Invalid input(s): FREET3   Coagulation profile No results for input(s): INR, PROTIME in the last 168 hours. ------------------------------------------------------------------------------------------------------------------- No results for input(s): DDIMER in the last 72 hours. -------------------------------------------------------------------------------------------------------------------  Cardiac Enzymes Recent Labs  Lab 08/26/18 1737  TROPONINI <0.03   ------------------------------------------------------------------------------------------------------------------ Invalid input(s): POCBNP  ---------------------------------------------------------------------------------------------------------------  Urinalysis    Component Value Date/Time   COLORURINE Yellow 10/10/2011 1535   APPEARANCEUR Turbid 10/10/2011 1535   LABSPEC 1.014 10/10/2011 1535   PHURINE 5.0 10/10/2011 1535   GLUCOSEU >=500 10/10/2011 1535   HGBUR 3+ 10/10/2011 1535   BILIRUBINUR Negative 10/10/2011 1535   KETONESUR Negative 10/10/2011 1535   PROTEINUR 30 mg/dL 40/98/119107/27/2013 [id number]   NITRITE Negative 10/10/2011 1535   LEUKOCYTESUR 3+ 10/10/2011 1535     RADIOLOGY: Dg Chest Port 1 View  Result Date: 08/26/2018 CLINICAL DATA:  Patient presents to the ED from  [place] for potassium greater than 6 and heart rate in the 40s. Patient [facility] due to shortness of breath, occasional dizziness, and generalized weakness EXAM: PORTABLE CHEST 1 VIEW COMPARISON:  12/05/2017 FINDINGS: Stable  changes from prior CABG surgery. Cardiac silhouette is top-normal in size. No mediastinal or hilar masses. No convincing adenopathy. There is opacity at the left lung base obscuring hemidiaphragm. This is new since the prior study. Prominent bronchovascular markings are noted bilaterally, stable. Lungs are otherwise clear. Possible small left and minimal right pleural effusions. No pneumothorax. Skeletal structures are grossly intact. IMPRESSION: 1. Left lung base opacity consistent with atelectasis or pneumonia. No convincing pulmonary edema. Electronically Signed   By: Lajean Manes M.D.   On: 08/26/2018 18:45    EKG: Orders placed or performed during the hospital encounter of 08/26/18  . EKG 12-Lead  . EKG 12-Lead    IMPRESSION AND PLAN: *Acute hypoxic respiratory failure Most likely secondary to congestive heart failure and bradycardia Admit to telemetry bed, supplemental oxygen with weaning as tolerated  *Acute on chronic diastolic congestive heart failure exacerbation, mild Most recent echocardiogram with low normal ejection fraction Congestive heart failure protocol, low-dose IV Lasix twice daily, strict I&O monitoring, beta-blocker therapy contraindicated given bradycardia, continue aspirin, statin therapy, losartan, supplemental oxygen wean as tolerated  *Acute bradycardia Most likely secondary to acute hyperkalemia Cycle cardiac enzymes, treatment of hyperkalemia per below, telemetry monitoring, supplemental oxygen wean as tolerated, check echocardiogram, and continue close medical monitoring  *Acute hyperkalemia Treated with calcium gluconate, dextrose with IV insulin, Lokelma, bicarb in the emergency room Recheck potassium at 2200 hrs., BMP in the morning  *Chronic diabetes mellitus type 2 Hold metformin while in house, sliding scale insulin with Accu-Cheks per routine  *Chronic hyperlipidemia, unspecified Continue statin therapy  *Chronic hypertension Hold Norvasc given  lower extremity edema, continue losartan, hydralazine PRN  *History of coronary artery disease Continue aspirin, statin therapy, Imdur, losartan, beta-blocker therapy contraindicated given bradycardia  All the records are reviewed and case discussed with ED provider. Management plans discussed with the patient, family and they are in agreement.  CODE STATUS:full Code Status History    Date Active Date Inactive Code Status Order ID Comments User Context   11/30/2017 0126 12/01/2017 1708 Full Code [id number]  Amelia Jo, MD Inpatient   Advance Care Planning Activity       TOTAL TIME TAKING CARE OF THIS PATIENT: 40 minutes.    Avel Peace Salary M.D on 08/26/2018   Between 7am to 6pm - Pager - [phone]  After 6pm go to [email or web address] - password [email or web address] [place] [facility]  Office  [phone]  CC: Primary care physician; Baxter Hire, MD   Note: This dictation was prepared with Dragon dictation along with smaller phrase technology. Any transcriptional errors that result from this process are unintentional.

## 2018-08-26 NOTE — Progress Notes (Signed)
Family Meeting Note  Advance Directive:yes  Today a meeting took place with the Patient.  Patient is able to participate   The following clinical team members were present during this meeting:MD  The following were discussed:Patient's diagnosis:78 y.o. male with a known history per below which includes diastolic congestive heart failure, presenting to his primary care provider earlier today with 1 week history of progressive shortness of breath, dyspnea on exertion, worsening swelling, generalized weakness, fatigue, dizziness, patient was noted to have high potassium level in the clinic setting with bradycardia into the 40s, patient sent to the ER for further work-up, noted potassium 6.5, creatinine 1.3, glucose 244, BNP greater than 1700, chest x-ray noted for atelectasis versus pneumonia, patient evaluated in the emergency room, no apparent distress, resting comfortably in bed, patient is now be admitted for acute hypoxic respiratory failure due to probable mild diastolic congestive heart failure exacerbation/bradycardia, and hyperkalemia, Patient's progosis: Unable to determine and Goals for treatment: Full Code  Additional follow-up to be provided: prn  Time spent during discussion:20 minutes  Gorden Harms, MD

## 2018-08-27 ENCOUNTER — Inpatient Hospital Stay
Admit: 2018-08-27 | Discharge: 2018-08-27 | Disposition: A | Discharge status: Home / Self Care | Payer: No Typology Code available for payment source | Attending: Family Medicine | Admitting: Family Medicine

## 2018-08-27 LAB — BASIC METABOLIC PANEL
Anion gap: 11 (ref 5–15)
BUN: 29 mg/dL — ABNORMAL HIGH (ref 8–23)
CO2: 19 mmol/L — ABNORMAL LOW (ref 22–32)
Calcium: 8.7 mg/dL — ABNORMAL LOW (ref 8.9–10.3)
Chloride: 102 mmol/L (ref 98–111)
Creatinine, Ser: 1.26 mg/dL — ABNORMAL HIGH (ref 0.61–1.24)
GFR calc Af Amer: >60 mL/min (ref >60)
GFR calc non Af Amer: 54 mL/min — ABNORMAL LOW (ref >60)
Glucose, Bld: 139 mg/dL — ABNORMAL HIGH (ref 70–99)
Potassium: 4.1 mmol/L (ref 3.5–5.1)
Sodium: 132 mmol/L — ABNORMAL LOW (ref 135–145)

## 2018-08-27 LAB — GLUCOSE, CAPILLARY
Glucose-Capillary: 137 mg/dL — ABNORMAL HIGH (ref 70–99)
Glucose-Capillary: 244 mg/dL — ABNORMAL HIGH (ref 70–99)
Glucose-Capillary: 210 mg/dL — ABNORMAL HIGH (ref 70–99)
Glucose-Capillary: 152 mg/dL — ABNORMAL HIGH (ref 70–99)

## 2018-08-27 LAB — ECHOCARDIOGRAM COMPLETE
Height: 75 in
Weight: 2704 oz

## 2018-08-27 LAB — TROPONIN I
Troponin I: <0.03 ng/mL (ref <0.03)
Troponin I: 0.04 ng/mL (ref <0.03)

## 2018-08-27 LAB — LACTIC ACID, PLASMA
Lactic Acid, Venous: 1.5 mmol/L (ref 0.5–1.9)
Lactic Acid, Venous: 3.1 mmol/L (ref 0.5–1.9)

## 2018-08-27 MED ORDER — INSULIN GLARGINE 100 UNIT/ML ~~LOC~~ SOLN
8.0000 [IU] | Freq: Every day | SUBCUTANEOUS | Status: DC
  Administered 2018-08-27 – 2018-08-28 (×2): 8 [IU] via SUBCUTANEOUS
  Filled 2018-08-27 (×3): qty 0.08

## 2018-08-27 NOTE — Consult Note (Signed)
Reason for Consult: Shortness of breath dyspnea hyperkalemia Referring Physician: Dr. Michaela CornerJohn Snow primary Dr. Tonny BranchMontel Snow hospitalist  Christopher Snow Christopher Snow Christopher ChamberlainBrim is an 78 y.o. male.  HPI: 78 year old white male history of recent dyspnea shortness of breath present with diastolic congestive heart failure saw his primary physician with a one-week history of progressive dyspnea shortness of breath worsening swelling weakness fatigue dizziness he was also found to have a high potassium and bradycardia.  Patient went to emergency room found to have a potassium of 6.5 creatinine 1.3 BNP was elevated greater than 1700 chest x-ray showed atelectasis versus pneumonia patient was found to be hypoxic so was admitted for further evaluation and hyperkalemia acute therapy denied any significant chest pain symptoms.  Patient denies any significant previous cardiac history  Past Medical History:  Diagnosis Date  . Diabetes mellitus without complication (HCC)   . Heart attack (HCC)   . Hyperlipidemia   . Hypertension     Past Surgical History:  Procedure Laterality Date  . APPENDECTOMY    . CARDIAC SURGERY     five bypass  . CORONARY ARTERY BYPASS GRAFT    . HERNIA REPAIR    . PR VEIN BYPASS GRAFT,AORTO-FEM-POP      Family History  Problem Relation Age of Onset  . Heart disease Mother   . Heart disease Father   . Diabetes Maternal Grandmother   . Diabetes Maternal Grandfather     Social History:  reports that he has quit smoking. He has never used smokeless tobacco. He reports that he does not drink alcohol or use drugs.  Allergies:  Allergies  Allergen Reactions  . Statins Other (See Comments)    Medications: I have reviewed the patient's current medications.  Results for orders placed or performed during the hospital encounter of 08/26/18 (from the past 48 hour(s))  CBC with Differential     Status: Abnormal   Collection Time: 08/26/18  5:37 PM  Result Value Ref Range   WBC 9.6 4.0 - 10.5  K/uL   RBC 4.49 4.22 - 5.81 MIL/uL   Hemoglobin 13.4 13.0 - 17.0 g/dL   HCT 96.040.2 45.439.0 - 09.852.0 %   MCV 89.5 80.0 - 100.0 fL   MCH 29.8 26.0 - 34.0 pg   MCHC 33.3 30.0 - 36.0 g/dL   RDW 11.914.5 14.711.5 - 82.915.5 %   Platelets 189 150 - 400 K/uL   nRBC 0.0 0.0 - 0.2 %   Neutrophils Relative % 81 %   Neutro Abs 7.8 (H) 1.7 - 7.7 K/uL   Lymphocytes Relative 13 %   Lymphs Abs 1.2 0.7 - 4.0 K/uL   Monocytes Relative 5 %   Monocytes Absolute 0.5 0.1 - 1.0 K/uL   Eosinophils Relative 0 %   Eosinophils Absolute 0.0 0.0 - 0.5 K/uL   Basophils Relative 0 %   Basophils Absolute 0.0 0.0 - 0.1 K/uL   Immature Granulocytes 1 %   Abs Immature Granulocytes 0.12 (H) 0.00 - 0.07 K/uL    Comment: Performed at [facility], [address] [place], [place] [address]  Comprehensive metabolic panel     Status: Abnormal   Collection Time: 08/26/18  5:37 PM  Result Value Ref Range   Sodium 128 (L) 135 - 145 mmol/L   Potassium 6.5 (HH) 3.5 - 5.1 mmol/L    Comment: CRITICAL RESULT CALLED TO, READ BACK BY AND VERIFIED WITH JEANETTE PEREZ AT 1814 08/26/2018.PMF   Chloride 99 98 - 111 mmol/L   CO2 17 (  L) 22 - 32 mmol/L   Glucose, Bld 244 (H) 70 - 99 mg/dL   BUN 28 (H) 8 - 23 mg/dL   Creatinine, Ser 1.33 (H) 0.61 - 1.24 mg/dL   Calcium 8.8 (L) 8.9 - 10.3 mg/dL   Total Protein 7.4 6.5 - 8.1 g/dL   Albumin 4.3 3.5 - 5.0 g/dL   AST 35 15 - 41 U/L   ALT 30 0 - 44 U/L   Alkaline Phosphatase 104 38 - 126 U/L   Total Bilirubin 1.1 0.3 - 1.2 mg/dL   GFR calc non Af Amer 50 (L) >60 mL/min   GFR calc Af Amer 58 (L) >60 mL/min   Anion gap 12 5 - 15    Comment: Performed at [facility], [address] [place], [place] [address]  Troponin I - ONCE - STAT     Status: None   Collection Time: 08/26/18  5:37 PM  Result Value Ref Range   Troponin I <0.03 <0.03 ng/mL    Comment: Performed at [facility], [address] [place], [place] [address]  Magnesium     Status: None   Collection Time:  08/26/18  5:37 PM  Result Value Ref Range   Magnesium 1.7 1.7 - 2.4 mg/dL    Comment: Performed at [facility], [address] [place], [place] [address]  Brain natriuretic peptide     Status: Abnormal   Collection Time: 08/26/18  6:11 PM  Result Value Ref Range   B Natriuretic Peptide 1,711.0 (H) 0.0 - 100.0 pg/mL    Comment: Performed at [facility], [address] [place], [place] [address]  SARS Coronavirus 2 (CEPHEID - Performed in [facility] lab), Hosp Order     Status: None   Collection Time: 08/26/18  6:12 PM   Specimen: Nasopharyngeal Swab  Result Value Ref Range   SARS Coronavirus 2 NEGATIVE NEGATIVE    Comment: (NOTE) If result is NEGATIVE SARS-CoV-2 target nucleic acids are NOT DETECTED. The SARS-CoV-2 RNA is generally detectable in upper and lower  respiratory specimens during the acute phase of infection. The lowest  concentration of SARS-CoV-2 viral copies this assay can detect is 250  copies / mL. A negative result does not preclude SARS-CoV-2 infection  and should not be used as the sole basis for treatment or other  patient management decisions.  A negative result may occur with  improper specimen collection / handling, submission of specimen other  than nasopharyngeal swab, presence of viral mutation(s) within the  areas targeted by this assay, and inadequate number of viral copies  (<250 copies / mL). A negative result must be combined with clinical  observations, patient history, and epidemiological information. If result is POSITIVE SARS-CoV-2 target nucleic acids are DETECTED. The SARS-CoV-2 RNA is generally detectable in upper and lower  respiratory specimens dur ing the acute phase of infection.  Positive  results are indicative of active infection with SARS-CoV-2.  Clinical  correlation with patient history and other diagnostic information is  necessary to determine patient infection status.  Positive results do  not rule  out bacterial infection or co-infection with other viruses. If result is PRESUMPTIVE POSTIVE SARS-CoV-2 nucleic acids MAY BE PRESENT.   A presumptive positive result was obtained on the submitted specimen  and confirmed on repeat testing.  While 2019 novel coronavirus  (SARS-CoV-2) nucleic acids may be present in the submitted sample  additional confirmatory testing may be necessary for epidemiological  and /  or clinical management purposes  to differentiate between  SARS-CoV-2 and other Sarbecovirus currently known to infect humans.  If clinically indicated additional testing with an alternate test  methodology [phone]([id number]) is advised. The SARS-CoV-2 RNA is generally  detectable in upper and lower respiratory sp ecimens during the acute  phase of infection. The expected result is Negative. Fact Sheet for Patients:  BoilerBrush.com.cyhttps://www.fda.gov/media/136312/download Fact Sheet for Healthcare Providers: https://pope.com/https://www.fda.gov/media/136313/download This test is not yet approved or cleared by the [place] FDA and has been authorized for detection and/or diagnosis of SARS-CoV-2 by FDA under an Emergency Use Authorization (EUA).  This EUA will remain in effect (meaning this test can be used) for the duration of the COVID-19 declaration under Section 564(b)(1) of the Act, 21 U.S.C. section 360bbb-3(b)(1), unless the authorization is terminated or revoked sooner. Performed at [facility], [address] [place], [place] [address]   Glucose, capillary     Status: Abnormal   Collection Time: 08/26/18  6:55 PM  Result Value Ref Range   Glucose-Capillary 249 (H) 70 - 99 mg/dL  Lactic acid, plasma     Status: Abnormal   Collection Time: 08/26/18  7:07 PM  Result Value Ref Range   Lactic Acid, Venous 2.4 (HH) 0.5 - 1.9 mmol/L    Comment: CRITICAL RESULT CALLED TO, READ BACK BY AND VERIFIED WITH JEANETTE PEREZ AT 1941 ON 08/26/2018 [facility]. Performed at [facility], [address] [place], [place] [address]   Culture, blood (routine x 2)     Status: None (Preliminary result)   Collection Time: 08/26/18  7:07 PM   Specimen: BLOOD  Result Value Ref Range   Specimen Description BLOOD RIGHT ANTECUBITAL    Special Requests      BOTTLES DRAWN AEROBIC AND ANAEROBIC Blood Culture adequate volume   Culture      NO GROWTH < 12 HOURS Performed at [facility], [address] [place], [place] [address]    Report Status PENDING   Culture, blood (routine x 2)     Status: None (Preliminary result)   Collection Time: 08/26/18  7:07 PM   Specimen: BLOOD  Result Value Ref Range   Specimen Description BLOOD RIGHT HAND    Special Requests      BOTTLES DRAWN AEROBIC ONLY Blood Culture results may not be optimal due to an inadequate volume of blood received in culture bottles   Culture      NO GROWTH < 12 HOURS Performed at [facility], [address] [place], [place] [address]    Report Status PENDING   Glucose, capillary     Status: Abnormal   Collection Time: 08/26/18  8:12 PM  Result Value Ref Range   Glucose-Capillary 295 (H) 70 - 99 mg/dL  Glucose, capillary     Status: Abnormal   Collection Time: 08/26/18  8:54 PM  Result Value Ref Range   Glucose-Capillary 265 (H) 70 - 99 mg/dL  Lactic acid, plasma     Status: Abnormal   Collection Time: 08/26/18  9:08 PM  Result Value Ref Range   Lactic Acid, Venous 3.7 (HH) 0.5 - 1.9 mmol/L    Comment: CRITICAL RESULT CALLED TO, READ BACK BY AND VERIFIED WITH NATASHA DELOSREYES @2135  08/26/18 MJU Performed at [facility], [address] [place], [place] [address]   Potassium     Status: None   Collection Time: 08/26/18  9:08 PM  Result Value Ref Range   Potassium 4.5 3.5 - 5.1  mmol/L    Comment: Performed at [facility], [address] [place], [place] [address]  Hemoglobin A1c     Status: Abnormal   Collection Time: 08/26/18  9:08 PM  Result Value Ref Range   Hgb A1c MFr Bld  7.7 (H) 4.8 - 5.6 %    Comment: (NOTE) Pre diabetes:          5.7%-6.4% Diabetes:              >6.4% Glycemic control for   <7.0% adults with diabetes    Mean Plasma Glucose 174.29 mg/dL    Comment: Performed at [facility], [address], [place], [place] [address]  Troponin I - Now Then Q6H     Status: None   Collection Time: 08/26/18  9:08 PM  Result Value Ref Range   Troponin I <0.03 <0.03 ng/mL    Comment: Performed at [facility], [address] [place], [place] [address]  Lactic acid, plasma     Status: Abnormal   Collection Time: 08/26/18 11:50 PM  Result Value Ref Range   Lactic Acid, Venous 3.1 (HH) 0.5 - 1.9 mmol/L    Comment: CRITICAL RESULT CALLED TO, READ BACK BY AND VERIFIED WITH MICHELLE ROGERS AT 0017 ON 08/27/2018 [facility]. Performed at [facility], [address] [place], [place] [address]   Basic metabolic panel     Status: Abnormal   Collection Time: 08/27/18  2:46 AM  Result Value Ref Range   Sodium 132 (L) 135 - 145 mmol/L   Potassium 4.1 3.5 - 5.1 mmol/L   Chloride 102 98 - 111 mmol/L   CO2 19 (L) 22 - 32 mmol/L   Glucose, Bld 139 (H) 70 - 99 mg/dL   BUN 29 (H) 8 - 23 mg/dL   Creatinine, Ser 2.95 (H) 0.61 - 1.24 mg/dL   Calcium 8.7 (L) 8.9 - 10.3 mg/dL   GFR calc non Af Amer 54 (L) >60 mL/min   GFR calc Af Amer >60 >60 mL/min   Anion gap 11 5 - 15    Comment: Performed at [facility], [address] [place], [place] [address]  Troponin I - Now Then Q6H     Status: None   Collection Time: 08/27/18  2:46 AM  Result Value Ref Range   Troponin I <0.03 <0.03 ng/mL    Comment: Performed at [facility], [address] [place], [place] [address]  Lactic acid, plasma     Status: None   Collection Time: 08/27/18  4:17 AM  Result Value Ref Range   Lactic Acid, Venous 1.5 0.5 - 1.9 mmol/L    Comment: Performed at [facility], [address] [place], [place] [address]  Glucose, capillary      Status: Abnormal   Collection Time: 08/27/18  8:18 AM  Result Value Ref Range   Glucose-Capillary 137 (H) 70 - 99 mg/dL   Comment 1 Notify RN    Comment 2 Document in Chart   Troponin I - Now Then Q6H     Status: Abnormal   Collection Time: 08/27/18  8:29 AM  Result Value Ref Range   Troponin I 0.04 (HH) <0.03 ng/mL    Comment: CRITICAL RESULT CALLED TO, READ BACK BY AND VERIFIED WITH  [facility] AT 0908 08/27/2018 SDR Performed at [facility], [address] [place], [place] [address]   Glucose, capillary     Status: Abnormal   Collection Time: 08/27/18  11:32 AM  Result Value Ref Range   Glucose-Capillary 244 (H) 70 - 99 mg/dL   Comment 1 Notify RN    Comment 2 Document in Chart     Dg Chest Port 1 View  Result Date: 08/26/2018 CLINICAL DATA:  [facility] greater than 6 and heart rate in the 40s. [facility] of breath, occasional dizziness, and generalized weakness EXAM: PORTABLE CHEST 1 VIEW COMPARISON:  12/05/2017 FINDINGS: Stable changes from prior CABG surgery. Cardiac silhouette is top-normal in size. No mediastinal or hilar masses. No convincing adenopathy. There is opacity at the left lung base obscuring hemidiaphragm. This is new since the prior study. Prominent bronchovascular markings are noted bilaterally, stable. Lungs are otherwise clear. Possible small left and minimal right pleural effusions. No pneumothorax. Skeletal structures are grossly intact. IMPRESSION: 1. Left lung base opacity consistent with atelectasis or pneumonia. No convincing pulmonary edema. Electronically Signed   By: Amie Portlandavid  Ormond M.D.   On: 08/26/2018 18:45    Review of Systems  Constitutional: Positive for malaise/fatigue.  HENT: Positive for congestion.   Eyes: Negative.   Respiratory: Positive for shortness of breath.   Cardiovascular: Negative.   Gastrointestinal: Negative.   Genitourinary: Negative.   Musculoskeletal:  Negative.   Skin: Negative.   Neurological: Positive for dizziness and weakness.  Endo/Heme/Allergies: Negative.   Psychiatric/Behavioral: Negative.    Blood pressure (!) 144/65, pulse 68, temperature 98.1 F (36.7 C), temperature source Oral, resp. rate 18, height 6\' 3"  (1.905 m), weight 76.7 kg, SpO2 96 %. Physical Exam  Nursing note and vitals reviewed. Constitutional: He is oriented to person, place, and time. He appears well-developed and well-nourished.  HENT:  Head: Normocephalic and atraumatic.  Eyes: Pupils are equal, round, and reactive to light. Conjunctivae and EOM are normal.  Neck: Normal range of motion. Neck supple.  Cardiovascular: Normal rate and regular rhythm. Exam reveals gallop.  Murmur heard. Respiratory: Effort normal and breath sounds normal.  GI: Soft. Bowel sounds are normal.  Musculoskeletal: Normal range of motion.        General: Edema present.  Neurological: He is alert and oriented to person, place, and time. He has normal reflexes.  Skin: Skin is warm and dry.  Psychiatric: He has a normal mood and affect.    Assessment/Plan: Acute hypoxic respiratory failure Acute on chronic diastolic congestive heart failure Bradycardia Hyperkalemia Hyperlipidemia Coronary artery disease Hypertension Chronic renal sufficiency stage II Hyponatremia . Plan Agree with admit for rule out microinfarction Follow-up cardiac enzymes and EKGs Agree with treatment for hyperkalemia Diuretic therapy for heart failure Continue diuretic therapy Continue hyperlipidemia treatment Recommend inhalers as necessary Insulin therapy for diabetes management Avoid ACE or ARB because of hyperkalemia   Christopher Snow 08/27/2018, 4:07 PM

## 2018-08-27 NOTE — Progress Notes (Signed)
[employer] - [place] [facility]   PATIENT NAME: Christopher Snow    MR#:  [id number]  DATE OF BIRTH:  11/03/1940  SUBJECTIVE:  CHIEF COMPLAINT:   Chief Complaint  Patient presents with  . Shortness of Breath  . Abnormal Lab   The patient has better shortness of breath and cough.  On oxygen 2 L by nasal cannula this morning. REVIEW OF SYSTEMS:  Review of Systems  Constitutional: Negative for chills, fever and malaise/fatigue.  HENT: Negative for sore throat.   Eyes: Negative for blurred vision and double vision.  Respiratory: Positive for cough and shortness of breath. Negative for hemoptysis, sputum production, wheezing and stridor.   Cardiovascular: Positive for leg swelling. Negative for chest pain, palpitations and orthopnea.  Gastrointestinal: Negative for abdominal pain, blood in stool, diarrhea, melena, nausea and vomiting.  Genitourinary: Negative for dysuria, flank pain and hematuria.  Musculoskeletal: Negative for back pain and joint pain.  Neurological: Negative for dizziness, sensory change, focal weakness, seizures, loss of consciousness, weakness and headaches.  Endo/Heme/Allergies: Negative for polydipsia.  Psychiatric/Behavioral: Negative for depression. The patient is not nervous/anxious.     DRUG ALLERGIES:   Allergies  Allergen Reactions  . Statins Other (See Comments)   VITALS:  Blood pressure (!) 152/82, pulse 83, temperature (!) 97.5 F (36.4 C), resp. rate 20, height 6\' 3"  (1.905 m), weight 76.7 kg, SpO2 92 %. PHYSICAL EXAMINATION:  Physical Exam Constitutional:      General: He is not in acute distress. HENT:     Head: Normocephalic.     Mouth/Throat:     Mouth: Mucous membranes are moist.  Eyes:     General: No scleral icterus.    Conjunctiva/sclera: Conjunctivae normal.     Pupils: Pupils are equal, round, and reactive to light.  Neck:     Musculoskeletal: Normal range of motion and neck supple.     Vascular: No JVD.   Trachea: No tracheal deviation.  Cardiovascular:     Rate and Rhythm: Normal rate and regular rhythm.     Heart sounds: Normal heart sounds. No murmur. No gallop.   Pulmonary:     Effort: Pulmonary effort is normal. No respiratory distress.     Breath sounds: Normal breath sounds. No wheezing or rales.  Abdominal:     General: Bowel sounds are normal. There is no distension.     Palpations: Abdomen is soft.     Tenderness: There is no abdominal tenderness. There is no rebound.  Musculoskeletal: Normal range of motion.        General: No tenderness.     Right lower leg: No edema.     Left lower leg: No edema.  Skin:    Findings: No erythema or rash.  Neurological:     Mental Status: He is alert and oriented to person, place, and time.     Cranial Nerves: No cranial nerve deficit.  Psychiatric:        Mood and Affect: Mood normal.    LABORATORY PANEL:  Male CBC Recent Labs  Lab 08/26/18 1737  WBC 9.6  HGB 13.4  HCT 40.2  PLT 189   ------------------------------------------------------------------------------------------------------------------ Chemistries  Recent Labs  Lab 08/26/18 1737  08/27/18 0246  NA 128*  --  132*  K 6.5*   < > 4.1  CL 99  --  102  CO2 17*  --  19*  GLUCOSE 244*  --  139*  BUN 28*  --  29*  CREATININE 1.33*  --  1.26*  CALCIUM 8.8*  --  8.7*  MG 1.7  --   --   AST 35  --   --   ALT 30  --   --   ALKPHOS 104  --   --   BILITOT 1.1  --   --    < > = values in this interval not displayed.   RADIOLOGY:  Dg Chest Port 1 View  Result Date: 08/26/2018 CLINICAL DATA:  Patient presents to the ED from [facility] for potassium greater than 6 and heart rate in the 40s. Patient came to [facility] due to shortness of breath, occasional dizziness, and generalized weakness EXAM: PORTABLE CHEST 1 VIEW COMPARISON:  12/05/2017 FINDINGS: Stable changes from prior CABG surgery. Cardiac silhouette is top-normal in size. No mediastinal or hilar masses. No convincing  adenopathy. There is opacity at the left lung base obscuring hemidiaphragm. This is new since the prior study. Prominent bronchovascular markings are noted bilaterally, stable. Lungs are otherwise clear. Possible small left and minimal right pleural effusions. No pneumothorax. Skeletal structures are grossly intact. IMPRESSION: 1. Left lung base opacity consistent with atelectasis or pneumonia. No convincing pulmonary edema. Electronically Signed   By: Lajean Manes M.D.   On: 08/26/2018 18:45   ASSESSMENT AND PLAN:   *Acute hypoxic respiratory failure Most likely secondary to congestive heart failure and bradycardia Continue supplemental oxygen with weaning as tolerated  *Acute on chronic diastolic congestive heart failure exacerbation, mild Most recent echocardiogram with low normal ejection fraction Congestive heart failure protocol, low-dose IV Lasix twice daily, strict I&O monitoring, beta-blocker therapy contraindicated given bradycardia, continue aspirin, statin therapy, losartan, supplemental oxygen wean as tolerated.  Echo is pending.  *Acute bradycardia Most likely secondary to acute hyperkalemia Improved.  *Acute hyperkalemia Treated with calcium gluconate, dextrose with IV insulin, Lokelma, bicarb in the emergency room Improved.  Lactic acidosis.  Improved.  *Chronic diabetes mellitus type 2 Hold metformin while in house, sliding scale insulin with Accu-Cheks per routine.  Start basal Lantus.  Hemoglobin A1c 7.7.  *Chronic hyperlipidemia, unspecified Continue statin therapy  *Chronic hypertension Hold Norvasc given lower extremity edema, continue losartan, hydralazine PRN  *History of coronary artery disease Continue aspirin, statin therapy, Imdur, losartan, hold beta-blocker therapy contraindicated given bradycardia  I discussed with Dr. Clayborn Bigness. All the records are reviewed and case discussed with Care Management/Social Worker. Management plans discussed with  the patient, his wife and they are in agreement.  CODE STATUS: Full Code  TOTAL TIME TAKING CARE OF THIS PATIENT: 36 minutes.   More than 50% of the time was spent in counseling/coordination of care: YES  POSSIBLE D/C IN 2 DAYS, DEPENDING ON CLINICAL CONDITION.   Demetrios Loll M.D on 08/27/2018 at 2:55 PM  Between 7am to 6pm - Pager - [phone]  After 6pm go to [email or web address] - [facility]

## 2018-08-27 NOTE — Progress Notes (Signed)
Spoke with dr. Bridgett Larsson regarding troponin. No new orders. Will continue to monitor

## 2018-08-27 NOTE — Progress Notes (Signed)
CRITICAL VALUE ALERT  Critical Value:  Lactic 3.1  Date & Time Notied:  08/27/18  00:30  Provider Notified: Dr. Jannifer Franklin  Orders Received/Actions taken: Lactic level improving from prior level. Order placed for repeat lactic at 05:00 and hold off on fluids and diurese for now /

## 2018-08-27 NOTE — Progress Notes (Signed)
Pt 05:00 lactic level 1.5. MD Marcille Blanco made aware, no new orders. Pt in no distress with no concerns offered.

## 2018-08-27 NOTE — Progress Notes (Signed)
Paged dr. Bridgett Larsson to make aware troponin is 0.04

## 2018-08-28 LAB — BASIC METABOLIC PANEL
Anion gap: 11 (ref 5–15)
BUN: 30 mg/dL — ABNORMAL HIGH (ref 8–23)
CO2: 23 mmol/L (ref 22–32)
Calcium: 8.6 mg/dL — ABNORMAL LOW (ref 8.9–10.3)
Chloride: 104 mmol/L (ref 98–111)
Creatinine, Ser: 1.24 mg/dL (ref 0.61–1.24)
GFR calc Af Amer: >60 mL/min (ref >60)
GFR calc non Af Amer: 55 mL/min — ABNORMAL LOW (ref >60)
Glucose, Bld: 118 mg/dL — ABNORMAL HIGH (ref 70–99)
Potassium: 3.7 mmol/L (ref 3.5–5.1)
Sodium: 138 mmol/L (ref 135–145)

## 2018-08-28 LAB — GLUCOSE, CAPILLARY
Glucose-Capillary: 102 mg/dL — ABNORMAL HIGH (ref 70–99)
Glucose-Capillary: 221 mg/dL — ABNORMAL HIGH (ref 70–99)
Glucose-Capillary: 78 mg/dL (ref 70–99)
Glucose-Capillary: 201 mg/dL — ABNORMAL HIGH (ref 70–99)

## 2018-08-28 LAB — MAGNESIUM: Magnesium: 1.6 mg/dL — ABNORMAL LOW (ref 1.7–2.4)

## 2018-08-28 MED ORDER — MAGNESIUM SULFATE 2 GM/50ML IV SOLN
2.0000 g | Freq: Once | INTRAVENOUS | Status: AC
  Administered 2018-08-28: 2 g via INTRAVENOUS
  Filled 2018-08-28: qty 50

## 2018-08-28 MED ORDER — PANTOPRAZOLE SODIUM 40 MG PO TBEC
40.0000 mg | DELAYED_RELEASE_TABLET | Freq: Every day | ORAL | Status: DC
  Administered 2018-08-28: 40 mg via ORAL
  Filled 2018-08-28: qty 1

## 2018-08-28 NOTE — Progress Notes (Signed)
Subjective:  Patient had an episode of shortness of breath last night requiring supplemental oxygen he feels much better now denies any chest pain still on diuretic therapy  Objective:  Vital Signs in the last 24 hours: Temp:  [98.1 F (36.7 C)-98.4 F (36.9 C)] 98.4 F (36.9 C) (06/14 0724) Pulse Rate:  [68-86] 86 (06/14 0724) Resp:  [18-20] 18 (06/14 0724) BP: (128-144)/(48-68) 139/68 (06/14 0724) SpO2:  [94 %-100 %] 95 % (06/14 0923) Weight:  [74.1 kg] 74.1 kg (06/14 0348)  Intake/Output from previous day: 06/13 0701 - 06/14 0700 In: 400 [P.O.:400] Out: 1225 [Urine:1225] Intake/Output from this shift: Total I/O In: -  Out: 850 [Urine:850]  Physical Exam: General appearance: appears stated age Neck: no adenopathy, no carotid bruit, no JVD, supple, symmetrical, trachea midline and thyroid not enlarged, symmetric, no tenderness/mass/nodules Lungs: diminished breath sounds bibasilar and bilaterally Heart: regular rate and rhythm, S1, S2 normal, no murmur, click, rub or gallop Abdomen: soft, non-tender; bowel sounds normal; no masses,  no organomegaly Extremities: extremities normal, atraumatic, no cyanosis or edema Pulses: 2+ and symmetric Skin: Skin color, texture, turgor normal. No rashes or lesions Neurologic: Alert and oriented X 3, normal strength and tone. Normal symmetric reflexes. Normal coordination and gait  Lab Results: Recent Labs    08/26/18 1737  WBC 9.6  HGB 13.4  PLT 189   Recent Labs    08/27/18 0246 08/28/18 0429  NA 132* 138  K 4.1 3.7  CL 102 104  CO2 19* 23  GLUCOSE 139* 118*  BUN 29* 30*  CREATININE 1.26* 1.24   Recent Labs    08/27/18 0246 08/27/18 0829  TROPONINI <0.03 0.04*   Hepatic Function Panel Recent Labs    08/26/18 1737  PROT 7.4  ALBUMIN 4.3  AST 35  ALT 30  ALKPHOS 104  BILITOT 1.1   No results for input(s): CHOL in the last 72 hours. No results for input(s): PROTIME in the last 72 hours.  Imaging: Imaging  results have been reviewed  Cardiac Studies:  Assessment/Plan:  CABG Coronary Artery Disease Ischemic Heart Disease Palpitations Shortness of Breath  Hypertension COPD Acute on chronic congestive heart failure Cardiomyopathy probably ischemic Chronic renal sufficiency stage II . Plan Agree with telemetry Continue diuretic therapy Agree with inhalers as necessary for possible COPD Continue lipid management Maintain insulin for diabetes management and control Because of renal sufficiency will consider avoiding ACE or ARB with his recent hyperkalemia Increase activity Consider outpatient follow-up with Dr. Saralyn Pilar   LOS: 2 days    Shantina Chronister D Keandrea Tapley 08/28/2018, 12:51 PM

## 2018-08-28 NOTE — Plan of Care (Signed)
  Problem: Education: Goal: Knowledge of General Education information will improve Description: Including pain rating scale, medication(s)/side effects and non-pharmacologic comfort measures Outcome: Progressing   Problem: Education: Goal: Ability to demonstrate management of disease process will improve Outcome: Progressing   Problem: Activity: Goal: Capacity to carry out activities will improve Outcome: Progressing   

## 2018-08-28 NOTE — Evaluation (Signed)
Physical Therapy Evaluation Patient Details Name: Christopher Snow MRN: [id number] DOB: Apr 14, 1940 Today's Date: 08/28/2018   History of Present Illness  presented to ER secondary to SOB, abnormal lab values; admitted for management of acute hypoxic respiratory failure related to CHF exacerbation, hyperkalemia (admit 6.5, now 3.7)  Clinical Impression  Upon evaluation, patient alert and oriented; follows commands.  Reports having been OOB to chair and ambulatory in room since admission, but eager to trial longer distances outside of room.  Bilat UE/LE strength and ROM grossly symmetrical and WFL; baseline neuropathy reported mid-calf distally (L > R) due to neuropathy (unchanged related to this admission).  Functional balance (sitting and standing) grossly WFL; able to safely retrieve items from floor, move outside immediate BOS without buckling, LOB or safety concern.  Appears to have good safety awareness and insight with good understanding of limits of stability with functional activities.  Demonstrates ability to complete bed mobility indep; sit/stand, basic transfers and gait (50-150') with and without RW, close sup/mod indep.  Optimal gait performance-symmetry, fluidity and overall safety-noted with use of RW.  Do recommend continued use at discharge.  Patient voices understanding and agreement, stating he typically uses devices at baseline, but wanted to complete without "to show you I could do it". Of note, patient vitals stable and WFL on RA throughout session; sats >94% at rest and with exertion.  Very minimal/no SOB noted with gait efforts. Appears to be at baseline level of functional ability; no acute PT needs identified at this time.  Do encouraged continued mobility with staff as appropriate throughout remaining hospital stay.  Anticipate no formal PT needs at discharge.    Follow Up Recommendations No PT follow up    Equipment Recommendations  (has necessary equipment)     Recommendations for Other Services       Precautions / Restrictions Precautions Precautions: Fall Restrictions Weight Bearing Restrictions: No      Mobility  Bed Mobility Overal bed mobility: Independent                Transfers Overall transfer level: Modified independent               General transfer comment: completed with and without assist device, mod indep; good stability and LE strength with movement transition  Ambulation/Gait Ambulation/Gait assistance: Supervision Gait Distance (Feet): 200 Feet Assistive device: None       General Gait Details: mild trendelenburg gait pattern, decreased L ankle DF strength with mobility efforts; slow and guarded but no overt buckling or LOB.  Does tend to slow or cease gait performance with dynamic gait components (but self-regulates appropriately)  Stairs            Wheelchair Mobility    Modified Rankin (Stroke Patients Only)       Balance Overall balance assessment: Modified Independent Sitting-balance support: No upper extremity supported;Feet supported Sitting balance-Leahy Scale: Normal     Standing balance support: No upper extremity supported Standing balance-Leahy Scale: Good Standing balance comment: able to retrieve item from floor, functionally move outside immediate BOS without difficulty; good awareness of safety needs and limits of stability                             Pertinent Vitals/Pain Pain Assessment: No/denies pain    Home Living Family/patient expects to be discharged to:: Private residence Living Arrangements: Spouse/significant other Available Help at Discharge: Family Type of Home: House Home Access:  Stairs to enter Entrance Stairs-Rails: Right Entrance Stairs-Number of Steps: 5-6 Home Layout: One level Home Equipment: [place] - 4 wheels;Cane - single point(reports in process of getting scooter from [place])      Prior Function Level of Independence:  Independent with assistive device(s)         Comments: Mod indep with 4WRW vs SPC for ADLs, household and limited community mobility; enjoys playing music     Hand Dominance        Extremity/Trunk Assessment   Upper Extremity Assessment Upper Extremity Assessment: Overall WFL for tasks assessed    Lower Extremity Assessment Lower Extremity Assessment: Overall WFL for tasks assessed(baseline paresthesia mid-calf distally (L > R) due to neuropathy)       Communication   Communication: No difficulties  Cognition Arousal/Alertness: Awake/alert Behavior During Therapy: WFL for tasks assessed/performed Overall Cognitive Status: Within Functional Limits for tasks assessed                                        General Comments      Exercises Other Exercises Other Exercises: 25' with RW, sup/mod indep-improved symmetry and overall fluidity; do recommend continued use of RW for optimal safety/energy conservation at this time.  Patient voices understanding and agreement; reports he typically uses assist device, but wanted to complete lap without to "show you I could do it" Other Exercises: Toilet transfer, ambulatory without assist device, mod indep; indep manages lower body clothing, undergarments without difficulty.  Indep initiates seated position to don/doff items for optimal safety; utilizes figure-4 positioning to reach feet.  Good safety awareness and insight   Assessment/Plan    PT Assessment Patent does not need any further PT services  PT Problem List         PT Treatment Interventions      PT Goals (Current goals can be found in the Care Plan section)  Acute Rehab PT Goals Patient Stated Goal: to go home tomorrow PT Goal Formulation: All assessment and education complete, DC therapy Time For Goal Achievement: 08/28/18 Potential to Achieve Goals: Good    Frequency     Barriers to discharge        Co-evaluation               AM-PAC  PT "6 Clicks" Mobility  Outcome Measure Help needed turning from your back to your side while in a flat bed without using bedrails?: None Help needed moving from lying on your back to sitting on the side of a flat bed without using bedrails?: None Help needed moving to and from a bed to a chair (including a wheelchair)?: None Help needed standing up from a chair using your arms (e.g., wheelchair or bedside chair)?: None Help needed to walk in hospital room?: None Help needed climbing 3-5 steps with a railing? : None 6 Click Score: 24    End of Session Equipment Utilized During Treatment: Gait belt Activity Tolerance: Patient tolerated treatment well Patient left: in bed;with bed alarm set;with call bell/phone within reach Nurse Communication: Mobility status PT Visit Diagnosis: Difficulty in walking, not elsewhere classified (R26.2)    Time: 7342-8768 PT Time Calculation (min) (ACUTE ONLY): 21 min   Charges:   PT Evaluation $PT Eval Low Complexity: 1 Low PT Treatments $Therapeutic Activity: 8-22 mins       Raquelle Pietro H. Owens Shark, PT, DPT, NCS 08/28/18, 2:35 PM [phone]

## 2018-08-28 NOTE — Progress Notes (Signed)
[facility] [place] [facility]   PATIENT NAME: Christopher Snow    MR#:  [id number]  DATE OF BIRTH:  1940-06-30  SUBJECTIVE:  CHIEF COMPLAINT:   Chief Complaint  Patient presents with  . Shortness of Breath  . Abnormal Lab   The patient has better shortness of breath and cough.  On and off oxygen. REVIEW OF SYSTEMS:  Review of Systems  Constitutional: Negative for chills, fever and malaise/fatigue.  HENT: Negative for sore throat.   Eyes: Negative for blurred vision and double vision.  Respiratory: Positive for cough and shortness of breath. Negative for hemoptysis, sputum production, wheezing and stridor.   Cardiovascular: Negative for chest pain, palpitations, orthopnea and leg swelling.  Gastrointestinal: Negative for abdominal pain, blood in stool, diarrhea, melena, nausea and vomiting.  Genitourinary: Negative for dysuria, flank pain and hematuria.  Musculoskeletal: Negative for back pain and joint pain.  Neurological: Negative for dizziness, sensory change, focal weakness, seizures, loss of consciousness, weakness and headaches.  Endo/Heme/Allergies: Negative for polydipsia.  Psychiatric/Behavioral: Negative for depression. The patient is not nervous/anxious.     DRUG ALLERGIES:   Allergies  Allergen Reactions  . Statins Other (See Comments)   VITALS:  Blood pressure 139/68, pulse 86, temperature 98.4 F (36.9 C), temperature source Oral, resp. rate 18, height 6\' 3"  (1.905 m), weight 74.1 kg, SpO2 95 %. PHYSICAL EXAMINATION:  Physical Exam Constitutional:      General: He is not in acute distress. HENT:     Head: Normocephalic.     Mouth/Throat:     Mouth: Mucous membranes are moist.  Eyes:     General: No scleral icterus.    Conjunctiva/sclera: Conjunctivae normal.     Pupils: Pupils are equal, round, and reactive to light.  Neck:     Musculoskeletal: Normal range of motion and neck supple.     Vascular: No JVD.     Trachea: No tracheal  deviation.  Cardiovascular:     Rate and Rhythm: Normal rate and regular rhythm.     Heart sounds: Normal heart sounds. No murmur. No gallop.   Pulmonary:     Effort: Pulmonary effort is normal. No respiratory distress.     Breath sounds: Normal breath sounds. No wheezing or rales.  Abdominal:     General: Bowel sounds are normal. There is no distension.     Palpations: Abdomen is soft.     Tenderness: There is no abdominal tenderness. There is no rebound.  Musculoskeletal: Normal range of motion.        General: No tenderness.     Right lower leg: No edema.     Left lower leg: No edema.  Skin:    Findings: No erythema or rash.  Neurological:     Mental Status: He is alert and oriented to person, place, and time.     Cranial Nerves: No cranial nerve deficit.  Psychiatric:        Mood and Affect: Mood normal.    LABORATORY PANEL:  Male CBC Recent Labs  Lab 08/26/18 1737  WBC 9.6  HGB 13.4  HCT 40.2  PLT 189   ------------------------------------------------------------------------------------------------------------------ Chemistries  Recent Labs  Lab 08/26/18 1737  08/28/18 0429  NA 128*   < > 138  K 6.5*   < > 3.7  CL 99   < > 104  CO2 17*   < > 23  GLUCOSE 244*   < > 118*  BUN 28*   < >  30*  CREATININE 1.33*   < > 1.24  CALCIUM 8.8*   < > 8.6*  MG 1.7  --  1.6*  AST 35  --   --   ALT 30  --   --   ALKPHOS 104  --   --   BILITOT 1.1  --   --    < > = values in this interval not displayed.   RADIOLOGY:  No results found. ASSESSMENT AND PLAN:   *Acute hypoxic respiratory failure Most likely secondary to congestive heart failure and bradycardia Try to wean off supplemental oxygen with weaning as tolerated  *Acute on chronic combination of diastolic and systolic congestive heart failure exacerbation. Most recent echocardiogram with low normal ejection fraction Congestive heart failure protocol, low-dose IV Lasix twice daily, strict I&O monitoring,  beta-blocker therapy contraindicated given bradycardia, continue aspirin, statin therapy, losartan, supplemental oxygen wean as tolerated.  Echo: The left ventricle has moderately reduced systolic function, with an ejection fraction of 35-40%.  *Acute bradycardia Most likely secondary to acute hyperkalemia Improved.  *Acute hyperkalemia Treated with calcium gluconate, dextrose with IV insulin, Lokelma, bicarb in the emergency room Improved.  Lactic acidosis.  Improved.  *Chronic diabetes mellitus type 2 Hold metformin and glipizide while in house, sliding scale insulin with Accu-Cheks per routine.  Started basal Lantus.  Hemoglobin A1c 7.7.  *Chronic hyperlipidemia, unspecified Continue statin therapy  *Chronic hypertension Hold Norvasc given lower extremity edema, continue losartan, hydralazine PRN  *History of coronary artery disease Continue aspirin, statin therapy, Imdur, losartan, hold beta-blocker therapy contraindicated given bradycardia  I discussed with Dr. Clayborn Bigness. All the records are reviewed and case discussed with Care Management/Social Worker. Management plans discussed with the patient, his wife and they are in agreement.  CODE STATUS: Full Code  TOTAL TIME TAKING CARE OF THIS PATIENT: 26 minutes.   More than 50% of the time was spent in counseling/coordination of care: YES  POSSIBLE D/C IN 1-2 DAYS, DEPENDING ON CLINICAL CONDITION.   Demetrios Loll M.D on 08/28/2018 at 12:32 PM  Between 7am to 6pm - Pager - [phone]  After 6pm go to [email or web address] - [facility]

## 2018-08-28 NOTE — Progress Notes (Signed)
Initial Nutrition Assessment  RD working remotely.  DOCUMENTATION CODES:   Not applicable  INTERVENTION:  Encouraged adequate intake of calories and protein at meals to prevent further unintentional weight loss or loss of lean body mass. Discussed which foods are good sources of protein. Patient does not want to drink any oral nutrition supplements at this time.  NUTRITION DIAGNOSIS:   Inadequate oral intake related to decreased appetite as evidenced by per patient/family report, 11.7% weight loss over the past 9 months.  GOAL:   Patient will meet greater than or equal to 90% of their needs  MONITOR:   PO intake, Supplement acceptance, Labs, Weight trends, I & O's  REASON FOR ASSESSMENT:   Consult Diet education  ASSESSMENT:   78 year old male with PMHx of DM, HTN, HLD, hx MI s/p CABG admitted with acute hypoxic respiratory failure, mild acute on chronic diastolic CHF exacerbation.   Spoke with patient over the phone to obtain nutrition/weight history. Patient reports that PTA he did not have a good appetite and was not eating well for months. He reports that usually his wife prepares meals at home but he is not able to describe typical intake. He reports he has not ben eating as much protein at his meals. He didn't realize how much weight he had lost until he was weighed here. He reports his appetite is better today and he ate most of his breakfast this morning. He had just received lunch tray. He does not want to drink an ONS but he is willing to eat more protein at meals. Discussed importance of obtaining adequate calorie/protein to meet needs and prevent unintentional weight loss.  Patient reports his UBW was 180 lbs. Per chart he was 83.9 kg on 12/05/2017. He is now 74.1 kg (163.3 lbs). He has lost 9.8 kg (11.7% body weight) over the past 9 months, which is not significant for time frame but still very concerning. Patient reports he did not realize he had lost so much  weight.  Medications reviewed and include: Lasix 20 mg BID IV, glipizide 10 mg BID, Novolog 0-15 units TID with meals, Novolog 0-5 units QHS, Lantus 8 units QHS, pantoprazole.  Labs reviewed: CBG 102-210, BUN 30, Magnesium 1.6.  Patient is at risk for malnutrition. Unable to determine if patient is malnourished without completing NFPE.  NUTRITION - FOCUSED PHYSICAL EXAM:   Unable to complete at this time.  Diet Order:   Diet Order            Diet heart healthy/carb modified Room service appropriate? Yes; Fluid consistency: Thin; Fluid restriction: 1500 mL Fluid  Diet effective now             EDUCATION NEEDS:   Education needs have been addressed  Skin:  Skin Assessment: Reviewed RN Assessment  Last BM:  08/27/2018 per chart  Height:   Ht Readings from Last 1 Encounters:  08/26/18 6\' 3"  (1.905 m)   Weight:   Wt Readings from Last 1 Encounters:  08/28/18 74.1 kg   Ideal Body Weight:  89.1 kg  BMI:  Body mass index is 20.41 kg/m.  Estimated Nutritional Needs:   Kcal:  1850-2050  Protein:  85-95 grams  Fluid:  1.5 L/day  Willey Blade, MS, RD, LDN Office: [phone] Pager: [phone] After Hours/Weekend Pager: [phone]

## 2018-08-28 NOTE — Progress Notes (Addendum)
Pt magnesium at 1.6. Notify prime. Will continue to monitor.  Update 0636: Dr. Marcille Blanco states will place order. Will continue to monitor.

## 2018-08-29 LAB — BASIC METABOLIC PANEL
Anion gap: 11 (ref 5–15)
BUN: 26 mg/dL — ABNORMAL HIGH (ref 8–23)
CO2: 24 mmol/L (ref 22–32)
Calcium: 8.5 mg/dL — ABNORMAL LOW (ref 8.9–10.3)
Chloride: 100 mmol/L (ref 98–111)
Creatinine, Ser: 1.06 mg/dL (ref 0.61–1.24)
GFR calc Af Amer: >60 mL/min (ref >60)
GFR calc non Af Amer: >60 mL/min (ref >60)
Glucose, Bld: 166 mg/dL — ABNORMAL HIGH (ref 70–99)
Potassium: 4.2 mmol/L (ref 3.5–5.1)
Sodium: 135 mmol/L (ref 135–145)

## 2018-08-29 LAB — MAGNESIUM: Magnesium: 1.9 mg/dL (ref 1.7–2.4)

## 2018-08-29 LAB — GLUCOSE, CAPILLARY: Glucose-Capillary: 163 mg/dL — ABNORMAL HIGH (ref 70–99)

## 2018-08-29 MED ORDER — CARVEDILOL 3.125 MG PO TABS: 3.1250 mg | ORAL_TABLET | Freq: Two times a day (BID) | ORAL | 1 refills | Status: DC

## 2018-08-29 MED ORDER — CARVEDILOL 3.125 MG PO TABS
3.1250 mg | ORAL_TABLET | Freq: Two times a day (BID) | ORAL | Status: DC
  Administered 2018-08-29: 3.125 mg via ORAL
  Filled 2018-08-29: qty 1

## 2018-08-29 MED ORDER — FUROSEMIDE 40 MG PO TABS: 40.0000 mg | ORAL_TABLET | Freq: Every day | ORAL | 1 refills | Status: AC

## 2018-08-29 MED ORDER — ACETAMINOPHEN 325 MG PO TABS: 650.0000 mg | ORAL_TABLET | ORAL | Status: DC | PRN

## 2018-08-29 MED ORDER — NITROGLYCERIN 0.4 MG SL SUBL: 0.4000 mg | SUBLINGUAL_TABLET | SUBLINGUAL | 2 refills | Status: DC | PRN

## 2018-08-29 NOTE — Care Management Important Message (Signed)
Important Message  Patient Details  Name: Christopher Snow MRN: [id number] Date of Birth: Nov 03, 1940   Medicare Important Message Given:  No  Patient discharged prior to arrival to unit to deliver concurrent Medicare IM.  Dannette Barbara 08/29/2018, 12:12 PM

## 2018-08-29 NOTE — Discharge Summary (Signed)
[facility] [place] [facility]   PATIENT NAME: Geoffery Spruceaul Hebb    MR#:  [id number]  DATE OF BIRTH:  30-Mar-1940  DATE OF ADMISSION:  08/26/2018   ADMITTING PHYSICIAN: Bertrum SolMontell D Salary, MD  DATE OF DISCHARGE: 08/29/2018 11:40 AM  PRIMARY CARE PHYSICIAN: Gracelyn NurseJohnston, John D, MD   ADMISSION DIAGNOSIS:  Hyperkalemia [E87.5] SOB (shortness of breath) [R06.02] Congestive heart failure, unspecified HF chronicity, unspecified heart failure type (HCC) [I50.9] DISCHARGE DIAGNOSIS:  Active Problems:   CHF (congestive heart failure) (HCC)  SECONDARY DIAGNOSIS:   Past Medical History:  Diagnosis Date  . Diabetes mellitus without complication (HCC)   . Heart attack (HCC)   . Hyperlipidemia   . Hypertension    HOSPITAL COURSE:  *Acute hypoxic respiratory failure Most likely secondary to congestive heart failure and bradycardia Try to wean off supplemental oxygen with weaning as tolerated  *Acute on chronic combination of diastolic and systolic congestive heart failure exacerbation. Most recent echocardiogram with low normal ejection fraction Congestive heart failure protocol, low-dose IV Lasix twice daily, strict I&O monitoring, beta-blocker therapy contraindicated given bradycardia, continue aspirin, statin therapy, losartan, supplemental oxygen wean as tolerated.  Echo: The left ventricle has moderately reduced systolic function, with an ejection fraction of 35-40%.  Changed to p.o. Lasix and started low-dose Coreg. Continue losartan.  *Acute bradycardia Most likely secondary to acute hyperkalemia Improved.    *Acute hyperkalemia Treated with calcium gluconate, dextrose with IV insulin, Lokelma, bicarb in the emergency room Improved.  Lactic acidosis.  Improved.  *Chronic diabetes mellitus type 2 Hold metformin and glipizide while in house, sliding scale insulin with Accu-Cheks per routine.  Started basal Lantus.  Hemoglobin A1c 7.7.  *Chronic  hyperlipidemia, unspecified Continue statin therapy  *Chronic hypertension Hold Norvasc given lower extremity edema, continue losartan, hydralazine PRN  *History of coronary artery disease Continue aspirin, statin therapy, Imdur, losartan, hold beta-blocker therapy contraindicated given bradycardia DISCHARGE CONDITIONS:  Stable, discharged to home today. CONSULTS OBTAINED:   DRUG ALLERGIES:   Allergies  Allergen Reactions  . Statins Other (See Comments)   DISCHARGE MEDICATIONS:   Allergies as of 08/29/2018      Reactions   Statins Other (See Comments)      Medication List    STOP taking these medications   amLODipine 10 MG tablet Commonly known as: NORVASC   metoprolol tartrate 25 MG tablet Commonly known as: LOPRESSOR     TAKE these medications   albuterol 108 (90 Base) MCG/ACT inhaler Commonly known as: VENTOLIN HFA 2 puffs q.i.d. p.r.n. short of breath, wheezing, or cough   aspirin EC 81 MG tablet Take 81 mg by mouth daily.   atorvastatin 10 MG tablet Commonly known as: LIPITOR Take 5 mg by mouth daily at 6 PM.   carvedilol 3.125 MG tablet Commonly known as: COREG Take 1 tablet (3.125 mg total) by mouth 2 (two) times daily with a meal.   fluticasone 50 MCG/ACT nasal spray Commonly known as: FLONASE Place 2 sprays into both nostrils daily.   furosemide 40 MG tablet Commonly known as: Lasix Take 1 tablet (40 mg total) by mouth daily.   glipiZIDE 10 MG tablet Commonly known as: GLUCOTROL Take 10 mg by mouth 2 (two) times daily before a meal.   isosorbide mononitrate 30 MG 24 hr tablet Commonly known as: IMDUR TAKE ONE TABLET BY MOUTH ONCE DAILY   losartan 25 MG tablet Commonly known as: COZAAR Take 2 tablets (50 mg total) by mouth daily.  metFORMIN 1000 MG tablet Commonly known as: GLUCOPHAGE Take 1,000 mg by mouth 2 (two) times daily with a meal.   nitroGLYCERIN 0.4 MG SL tablet Commonly known as: NITROSTAT Place 1 tablet (0.4 mg total)  under the tongue every 5 (five) minutes as needed for chest pain.   valACYclovir 1000 MG tablet Commonly known as: VALTREX Take 1 tablet by mouth 3 (three) times daily.        DISCHARGE INSTRUCTIONS:  See AVS.  If you experience worsening of your admission symptoms, develop shortness of breath, life threatening emergency, suicidal or homicidal thoughts you must seek medical attention immediately by calling 911 or calling your MD immediately  if symptoms less severe.  You Must read complete instructions/literature along with all the possible adverse reactions/side effects for all the Medicines you take and that have been prescribed to you. Take any new Medicines after you have completely understood and accpet all the possible adverse reactions/side effects.   Please note  You were cared for by a hospitalist during your hospital stay. If you have any questions about your discharge medications or the care you received while you were in the hospital after you are discharged, you can call the unit and asked to speak with the hospitalist on call if the hospitalist that took care of you is not available. Once you are discharged, your primary care physician will handle any further medical issues. Please note that NO REFILLS for any discharge medications will be authorized once you are discharged, as it is imperative that you return to your primary care physician (or establish a relationship with a primary care physician if you do not have one) for your aftercare needs so that they can reassess your need for medications and monitor your lab values.    On the day of Discharge:  VITAL SIGNS:  Blood pressure 138/69, pulse 71, temperature 98 F (36.7 C), temperature source Oral, resp. rate 18, height 6\' 3"  (1.905 m), weight 75.9 kg, SpO2 100 %. PHYSICAL EXAMINATION:  GENERAL:  78 y.o.-year-old patient lying in the bed with no acute distress.  EYES: Pupils equal, round, reactive to light and  accommodation. No scleral icterus. Extraocular muscles intact.  HEENT: Head atraumatic, normocephalic. Oropharynx and nasopharynx clear.  NECK:  Supple, no jugular venous distention. No thyroid enlargement, no tenderness.  LUNGS: Normal breath sounds bilaterally, no wheezing, rales,rhonchi or crepitation. No use of accessory muscles of respiration.  CARDIOVASCULAR: S1, S2 normal. No murmurs, rubs, or gallops.  ABDOMEN: Soft, non-tender, non-distended. Bowel sounds present. No organomegaly or mass.  EXTREMITIES: No pedal edema, cyanosis, or clubbing.  NEUROLOGIC: Cranial nerves II through XII are intact. Muscle strength 5/5 in all extremities. Sensation intact. Gait not checked.  PSYCHIATRIC: The patient is alert and oriented x 3.  SKIN: No obvious rash, lesion, or ulcer.  DATA REVIEW:   CBC Recent Labs  Lab 08/26/18 1737  WBC 9.6  HGB 13.4  HCT 40.2  PLT 189    Chemistries  Recent Labs  Lab 08/26/18 1737  08/29/18 0515  NA 128*   < > 135  K 6.5*   < > 4.2  CL 99   < > 100  CO2 17*   < > 24  GLUCOSE 244*   < > 166*  BUN 28*   < > 26*  CREATININE 1.33*   < > 1.06  CALCIUM 8.8*   < > 8.5*  MG 1.7   < > 1.9  AST 35  --   --  ALT 30  --   --   ALKPHOS 104  --   --   BILITOT 1.1  --   --    < > = values in this interval not displayed.     Microbiology Results  Results for orders placed or performed during the hospital encounter of 08/26/18  SARS Coronavirus 2 (CEPHEID - Performed in [facility] hospital lab), Hosp Order     Status: None   Collection Time: 08/26/18  6:12 PM   Specimen: Nasopharyngeal Swab  Result Value Ref Range Status   SARS Coronavirus 2 NEGATIVE NEGATIVE Final    Comment: (NOTE) If result is NEGATIVE SARS-CoV-2 target nucleic acids are NOT DETECTED. The SARS-CoV-2 RNA is generally detectable in upper and lower  respiratory specimens during the acute phase of infection. The lowest  concentration of SARS-CoV-2 viral copies this assay can detect is  250  copies / mL. A negative result does not preclude SARS-CoV-2 infection  and should not be used as the sole basis for treatment or other  patient management decisions.  A negative result may occur with  improper specimen collection / handling, submission of specimen other  than nasopharyngeal swab, presence of viral mutation(s) within the  areas targeted by this assay, and inadequate number of viral copies  (<250 copies / mL). A negative result must be combined with clinical  observations, patient history, and epidemiological information. If result is POSITIVE SARS-CoV-2 target nucleic acids are DETECTED. The SARS-CoV-2 RNA is generally detectable in upper and lower  respiratory specimens dur ing the acute phase of infection.  Positive  results are indicative of active infection with SARS-CoV-2.  Clinical  correlation with patient history and other diagnostic information is  necessary to determine patient infection status.  Positive results do  not rule out bacterial infection or co-infection with other viruses. If result is PRESUMPTIVE POSTIVE SARS-CoV-2 nucleic acids MAY BE PRESENT.   A presumptive positive result was obtained on the submitted specimen  and confirmed on repeat testing.  While 2019 novel coronavirus  (SARS-CoV-2) nucleic acids may be present in the submitted sample  additional confirmatory testing may be necessary for epidemiological  and / or clinical management purposes  to differentiate between  SARS-CoV-2 and other Sarbecovirus currently known to infect humans.  If clinically indicated additional testing with an alternate test  methodology [id number]) is advised. The SARS-CoV-2 RNA is generally  detectable in upper and lower respiratory sp ecimens during the acute  phase of infection. The expected result is Negative. Fact Sheet for Patients:  BoilerBrush.com.cyhttps://www.fda.gov/media/136312/download Fact Sheet for Healthcare Providers:  https://pope.com/https://www.fda.gov/media/136313/download This test is not yet approved or cleared by the [place] FDA and has been authorized for detection and/or diagnosis of SARS-CoV-2 by FDA under an Emergency Use Authorization (EUA).  This EUA will remain in effect (meaning this test can be used) for the duration of the COVID-19 declaration under Section 564(b)(1) of the Act, 21 U.S.C. section 360bbb-3(b)(1), unless the authorization is terminated or revoked sooner. Performed at [facility], [address] [place], [place] [address]   Culture, blood (routine x 2)     Status: None (Preliminary result)   Collection Time: 08/26/18  7:07 PM   Specimen: BLOOD  Result Value Ref Range Status   Specimen Description BLOOD RIGHT ANTECUBITAL  Final   Special Requests   Final    BOTTLES DRAWN AEROBIC AND ANAEROBIC Blood Culture adequate volume   Culture   Final    NO GROWTH 2  DAYS Performed at [facility], [address] [place], [place] [address]    Report Status PENDING  Incomplete  Culture, blood (routine x 2)     Status: None (Preliminary result)   Collection Time: 08/26/18  7:07 PM   Specimen: BLOOD  Result Value Ref Range Status   Specimen Description BLOOD RIGHT HAND  Final   Special Requests   Final    BOTTLES DRAWN AEROBIC ONLY Blood Culture results may not be optimal due to an inadequate volume of blood received in culture bottles   Culture   Final    NO GROWTH 2 DAYS Performed at [facility], [address] [place], [place] [address]    Report Status PENDING  Incomplete    RADIOLOGY:  No results found.   Management plans discussed with the patient, family and they are in agreement.  CODE STATUS: Full Code   TOTAL TIME TAKING CARE OF THIS PATIENT: 33 minutes.    Demetrios Loll M.D on 08/29/2018 at 1:31 PM  Between 7am to 6pm - Pager - [phone]  After 6pm go to [email or web address] - Proofreader  [facility]   Office  [phone]  CC: Primary care physician; Baxter Hire, MD   Note: This dictation was prepared with Dragon dictation along with smaller phrase technology. Any transcriptional errors that result from this process are unintentional.

## 2018-08-29 NOTE — Progress Notes (Signed)
Cardiac Rehab Navigator/ Exercise Physiologist Note  CHF Education:??  Educational session with patient completed.  Provided patient with "Living Better with Heart Failure" packet. Briefly reviewed definition of heart failure and signs and symptoms of an exacerbation.?Discussed potential causes of CHF.  Explained to patient that HF is a chronic illness which requires self-assessment / self-management along with help from the cardiologist/PCP. Discussed definition of EF measurement along with normal value compared to patient's EF 35-40%.  *Reviewed importance of and reason behind checking weight daily in the AM, after using the bathroom, but before getting dressed. Patient does not have scales at home but is going to get scales from his daughter's house next door. Patient will begin weighing daily.  *Reviewed with patient the following information:  *Discussed when to call the Dr= weight gain of >2-3lb overnight of 5lb in a week,  *Discussed yellow zone= call MD: weight gain of >2-3lb overnight of 5lb in a week, increased swelling, increased SOB when lying down, chest discomfort, dizziness, increased fatigue  *Red Zone= call 911: struggle to breath, fainting or near fainting, significant chest pain ?  *Heart Failure Zone Magnet given and reviewed with patient. ?  *Diet - Patient currently ordered carb modified diet. Referral for Dietitian Consultation for diet education has been ordered. Instructed patient to follow a low sodium diet of 2000 mg or less.  Recommended foods for low sodium heart healthy nutrition or heart failure carb modified nutrition therapy discussed. Reviewed with patient steps to reading a food label with close attention to serving size and mg of sodium.   *Discussed fluid intake with patient as well. Patient on fluid restriction currently on 1500 ml fluid restriction. Demonstrated this volume to patient using the bedside water pitcher. ?  *Instructed patient to take  medications as prescribed for heart failure. Explained briefly why patient is on the medications (either make you feel better, live longer or keep you out of the hospital) and discussed monitoring and side effects.?  *Discussed exercise / activity. Patient has not been active due to shortness of breath. Patient reports having to stop and take breaks going to the mailbox. Encouraged patient to be as active as possible. Give patient program brochure and informational sheet as appropriate. Patient reports that it will be difficult to participate in the program because he and his wife babysit their 30 and 21 year old grandchildren. Patient wants to discuss Virtual rehab with his daughter to see if he will be able to participate. Patient's daughter's name is Alm Bustard and phone number is [phone].  *Smoking Cessation- Patient is a NEVER smoker. ?  *[facility] - Explained the purpose of the HF Clinic. Explained to patient the HF Clinic does not replace PCP nor Cardiologist, but is an additional resource to helping patient manage heart failure at home. Patient has a follow-up appointment on 09/13/18 at 12:40pm.  ? Again, the 5 Steps to Living Better with Heart Failure were reviewed with patient.  Patient thanked me for providing the above information. ?  Jasper Loser, [facility]  Exercise Physiologist Department Phone #: [phone] Fax: [phone]  Direct Line [phone] Email Address: [email or web address]

## 2018-08-31 LAB — CULTURE, BLOOD (ROUTINE X 2)
Culture: NO GROWTH
Culture: NO GROWTH
Special Requests: ADEQUATE

## 2018-09-05 DIAGNOSIS — I2510 Atherosclerotic heart disease of native coronary artery without angina pectoris: Secondary | ICD-10-CM | POA: Diagnosis not present

## 2018-09-05 DIAGNOSIS — I10 Essential (primary) hypertension: Secondary | ICD-10-CM | POA: Diagnosis not present

## 2018-09-05 DIAGNOSIS — I5043 Acute on chronic combined systolic (congestive) and diastolic (congestive) heart failure: Secondary | ICD-10-CM | POA: Diagnosis not present

## 2018-09-05 DIAGNOSIS — Z09 Encounter for follow-up examination after completed treatment for conditions other than malignant neoplasm: Secondary | ICD-10-CM | POA: Diagnosis not present

## 2018-09-05 DIAGNOSIS — E119 Type 2 diabetes mellitus without complications: Secondary | ICD-10-CM | POA: Diagnosis not present

## 2018-09-05 DIAGNOSIS — E875 Hyperkalemia: Secondary | ICD-10-CM | POA: Diagnosis not present

## 2018-09-05 DIAGNOSIS — I5033 Acute on chronic diastolic (congestive) heart failure: Secondary | ICD-10-CM | POA: Diagnosis not present

## 2018-09-13 ENCOUNTER — Telehealth: Payer: PPO | Admitting: Family

## 2018-10-05 NOTE — Progress Notes (Deleted)
   Patient ID: Adeel Guiffre, male    DOB: Jul 11, 1940, 78 y.o.   MRN: [id number]  HPI  Mr Fontaine is a 78 y/o male with a history of  Echo report from 08/27/2018 reviewed and showed an EF of 35-40%  Admitted 08/26/2018 due to acute heart failure. Cardiology consult obtained.Initially give IV lasix and then transitioned to oral diuretics. Discharged after 3 days.   He presents today for his initial visit with a chief complaint of   Review of Systems    Physical Exam    Assessment & Plan:  1: Chronic heart failure with reduced ejection fraction- - NYHA class - saw cardiology (Paraschos) 04/12/17 - BNP 08/26/2018 was 1711.0  2: HTN- - BP - saw PCP 08/26/2018 - BMP 08/29/2018 reviewed and showed sodium 135, potassium 4.2, creatinine 1.06 and GFR >60  3: DM- - A1c 08/26/2018 was 7.7%

## 2018-10-07 ENCOUNTER — Ambulatory Visit: Payer: PPO | Admitting: Family

## 2018-10-11 DIAGNOSIS — S41101A Unspecified open wound of right upper arm, initial encounter: Secondary | ICD-10-CM | POA: Diagnosis not present

## 2018-10-11 DIAGNOSIS — W010XXA Fall on same level from slipping, tripping and stumbling without subsequent striking against object, initial encounter: Secondary | ICD-10-CM | POA: Diagnosis not present

## 2018-11-05 NOTE — Progress Notes (Signed)
Patient ID: Koren Boundaul Douglas Hojnacki, male    DOB: 09-Apr-1940, 78 y.o.   MRN: [id number]  HPI  Mr [name] is a 78 y/o male with a history of DM, hyperlipidemia, HTN, MI with resultant CABG, previous tobacco use and chronic heart failure.   Echo report from 08/27/2018 reviewed and showed an EF of 35-40%  Admitted 08/26/2018 due to acute heart failure. Cardiology consult obtained.Initially give IV lasix and then transitioned to oral diuretics. Discharged after 3 days.   He presents today for his initial visit with a chief complaint of minimal shortness of breath upon moderate exertion. He describes this as chronic in nature having been present for several years. He has associated fatigue, chest pain (at times), arm pain and light-headedness along with this. He denies any difficulty sleeping, abdominal distention, palpitations, pedal edema, cough or weight gain. Says that when his arms hurt/ ache, he will sometimes feel tight in his chest and he will sit still for "awhile" until the pain resolves. Occasionally has muscle pain in his legs as well. Unsure if it's been present since he's been taking a statin or not.    Past Medical History:  Diagnosis Date  . CHF (congestive heart failure) (HCC)   . Diabetes mellitus without complication (HCC)   . Heart attack (HCC)   . Hyperlipidemia   . Hypertension    Past Surgical History:  Procedure Laterality Date  . APPENDECTOMY    . CARDIAC SURGERY     five bypass  . CORONARY ARTERY BYPASS GRAFT    . HERNIA REPAIR    . PR VEIN BYPASS GRAFT,AORTO-FEM-POP     Family History  Problem Relation Age of Onset  . Heart disease Mother   . Heart disease Father   . Diabetes Maternal Grandmother   . Diabetes Maternal Grandfather    Social History   Tobacco Use  . Smoking status: Former Games developermoker  . Smokeless tobacco: Never Used  Substance Use Topics  . Alcohol use: No    Frequency: Never   Allergies  Allergen Reactions  . Statins Other (See Comments)   Prior  to Admission medications   Medication Sig Start Date End Date Taking? Authorizing Provider  albuterol (VENTOLIN HFA) 108 (90 Base) MCG/ACT inhaler 2 puffs q.i.d. p.r.n. short of breath, wheezing, or cough 01/14/18  Yes [provider]  aspirin EC 81 MG tablet Take 81 mg by mouth daily.   Yes [provider]  atorvastatin (LIPITOR) 10 MG tablet Take 5 mg by mouth daily at 6 PM.   Yes [provider]  carvedilol (COREG) 3.125 MG tablet Take 1 tablet (3.125 mg total) by mouth 2 (two) times daily with a meal. 08/29/18  Yes Shaune Pollackhen, Qing, MD  fluticasone [employer](FLONASE) 50 MCG/ACT nasal spray Place 2 sprays into both nostrils daily. 06/15/17  Yes [provider]  furosemide (LASIX) 40 MG tablet Take 1 tablet (40 mg total) by mouth daily. 08/29/18 11/07/18 Yes Shaune Pollackhen, Qing, MD  glipiZIDE (GLUCOTROL) 10 MG tablet Take 10 mg by mouth 2 (two) times daily before a meal.  02/16/17  Yes [provider]  isosorbide mononitrate (IMDUR) 30 MG 24 hr tablet TAKE ONE TABLET BY MOUTH ONCE DAILY 07/27/18  Yes [provider]  losartan (COZAAR) 25 MG tablet Take 2 tablets (50 mg total) by mouth daily. 12/01/17  Yes Enedina FinnerPatel, Sona, MD  metFORMIN (GLUCOPHAGE) 1000 MG tablet Take 1,000 mg by mouth 2 (two) times daily with a meal.   Yes [provider]  nitroGLYCERIN (NITROSTAT) 0.4 MG SL tablet Place 1 tablet (0.4 mg total) under the tongue every 5 (five) minutes as needed for chest pain. 08/29/18  Yes Demetrios Loll, MD    Review of Systems  Constitutional: Positive for fatigue (minimal). Negative for appetite change.  HENT: Positive for hearing loss. Negative for congestion, postnasal drip and sore throat.   Eyes: Negative.   Respiratory: Positive for shortness of breath (at times). Negative for cough.   Cardiovascular: Positive for chest pain. Negative for palpitations and leg swelling.  Gastrointestinal: Negative for abdominal distention and abdominal pain.  Endocrine: Negative.    Genitourinary: Negative.   Musculoskeletal: Positive for myalgias (arm pain). Negative for back pain.  Skin: Negative.   Allergic/Immunologic: Negative.   Neurological: Positive for light-headedness (at times). Negative for dizziness.  Hematological: Negative for adenopathy. Does not bruise/bleed easily.  Psychiatric/Behavioral: Negative for dysphoric mood and sleep disturbance (sleeping on 1 pillow). The patient is not nervous/anxious.    Vitals:   11/07/18 1104  BP: (!) 140/58  Pulse: 60  Resp: 18  Temp: 98 F (36.7 C)  TempSrc: Oral  SpO2: 98%  Weight: 174 lb (78.9 kg)  Height: 6\' 3"  (1.905 m)   Wt Readings from Last 3 Encounters:  11/07/18 174 lb (78.9 kg)  08/29/18 167 lb 6.4 oz (75.9 kg)  12/05/17 185 lb (83.9 kg)   Lab Results  Component Value Date   CREATININE 1.06 08/29/2018   CREATININE 1.24 08/28/2018   CREATININE 1.26 (H) 08/27/2018     Physical Exam Vitals signs and nursing note reviewed.  Constitutional:      Appearance: Normal appearance.  HENT:     Head: Normocephalic and atraumatic.     Right Ear: Decreased hearing noted.     Left Ear: Decreased hearing noted.  Neck:     Musculoskeletal: Normal range of motion and neck supple.  Cardiovascular:     Rate and Rhythm: Regular rhythm. Bradycardia present.  Pulmonary:     Effort: Pulmonary effort is normal. No respiratory distress.     Breath sounds: No wheezing or rales.  Abdominal:     General: There is no distension.     Palpations: Abdomen is soft.  Musculoskeletal:        General: Tenderness (lower legs) present.     Right lower leg: No edema.     Left lower leg: No edema.  Skin:    General: Skin is warm and dry.  Neurological:     General: No focal deficit present.     Mental Status: He is alert.  Psychiatric:        Mood and Affect: Mood normal.        Behavior: Behavior normal.        Thought Content: Thought content normal.    Assessment & Plan:  1: Chronic heart failure with  reduced ejection fraction- - NYHA class II - euvolemic today - weighing daily; he was reminded to call for an overnight weight gain of >2 pounds or a weekly weight gain of >5 pounds - does add salt to "lots of foods" but is now trying to not add salt and has been trying to read food labels. Reviewed the importance of not adding salt and to try to keep his daily sodium intake to 2000mg . Low sodium cookbook was given to patient - discussed changing losartan to entresto at his next visit as well as adding farxiga in the future - history of bradycardia so may not  be able to titrate up carvedilol  - saw cardiology Mellissa Kohut([place]) 09/05/2018 - BNP 08/26/2018 was 1711.0  2: HTN- - BP looks good today - saw PCP (Tumey) 10/11/2018 - BMP 08/29/2018 reviewed and showed sodium 135, potassium 4.2, creatinine 1.06 and GFR >60  3: DM- - A1c 08/26/2018 was 7.7% - glucose running 167-207; has been up in the 300's - [facility]  4: Myalgia- - arm pain/ leg pain (occasionally)  - unsure if symptoms has been present since he's been taking his statin - encouraged him to discuss this with his cardiologist  Medication list was reviewed.   Return in 1 month or sooner for any questions/problems before then.

## 2018-11-07 ENCOUNTER — Encounter: Payer: Self-pay | Admitting: Family

## 2018-11-07 ENCOUNTER — Other Ambulatory Visit: Payer: Self-pay

## 2018-11-07 ENCOUNTER — Ambulatory Visit: Payer: PPO | Attending: Family | Admitting: Family

## 2018-11-07 VITALS — BP 140/58 | HR 60 | Temp 98.0°F | Resp 18 | Ht 75.0 in | Wt 174.0 lb

## 2018-11-07 DIAGNOSIS — Z7982 Long term (current) use of aspirin: Secondary | ICD-10-CM | POA: Insufficient documentation

## 2018-11-07 DIAGNOSIS — R0602 Shortness of breath: Secondary | ICD-10-CM | POA: Insufficient documentation

## 2018-11-07 DIAGNOSIS — E119 Type 2 diabetes mellitus without complications: Secondary | ICD-10-CM | POA: Diagnosis not present

## 2018-11-07 DIAGNOSIS — E785 Hyperlipidemia, unspecified: Secondary | ICD-10-CM | POA: Diagnosis not present

## 2018-11-07 DIAGNOSIS — Z87891 Personal history of nicotine dependence: Secondary | ICD-10-CM | POA: Diagnosis not present

## 2018-11-07 DIAGNOSIS — I5022 Chronic systolic (congestive) heart failure: Secondary | ICD-10-CM

## 2018-11-07 DIAGNOSIS — M791 Myalgia, unspecified site: Secondary | ICD-10-CM | POA: Diagnosis not present

## 2018-11-07 DIAGNOSIS — Z7984 Long term (current) use of oral hypoglycemic drugs: Secondary | ICD-10-CM | POA: Insufficient documentation

## 2018-11-07 DIAGNOSIS — Z79899 Other long term (current) drug therapy: Secondary | ICD-10-CM | POA: Diagnosis not present

## 2018-11-07 DIAGNOSIS — Z8249 Family history of ischemic heart disease and other diseases of the circulatory system: Secondary | ICD-10-CM | POA: Insufficient documentation

## 2018-11-07 DIAGNOSIS — I110 Hypertensive heart disease with heart failure: Secondary | ICD-10-CM | POA: Insufficient documentation

## 2018-11-07 DIAGNOSIS — R5383 Other fatigue: Secondary | ICD-10-CM | POA: Insufficient documentation

## 2018-11-07 DIAGNOSIS — M79603 Pain in arm, unspecified: Secondary | ICD-10-CM | POA: Diagnosis not present

## 2018-11-07 DIAGNOSIS — R079 Chest pain, unspecified: Secondary | ICD-10-CM | POA: Insufficient documentation

## 2018-11-07 DIAGNOSIS — I10 Essential (primary) hypertension: Secondary | ICD-10-CM

## 2018-11-07 DIAGNOSIS — I252 Old myocardial infarction: Secondary | ICD-10-CM | POA: Insufficient documentation

## 2018-11-07 DIAGNOSIS — Z951 Presence of aortocoronary bypass graft: Secondary | ICD-10-CM | POA: Insufficient documentation

## 2018-11-07 NOTE — Patient Instructions (Addendum)
Begin weighing daily and call for an overnight weight gain of > 2 pounds or a weekly weight gain of >5 pounds.   Low-Sodium Eating Plan Sodium, which is an element that makes up salt, helps you maintain a healthy balance of fluids in your body. Too much sodium can increase your blood pressure and cause fluid and waste to be held in your body. Your health care provider or dietitian may recommend following this plan if you have high blood pressure (hypertension), kidney disease, liver disease, or heart failure. Eating less sodium can help lower your blood pressure, reduce swelling, and protect your heart, liver, and kidneys. What are tips for following this plan? General guidelines  Most people on this plan should limit their sodium intake to 2,000 mg (milligrams) of sodium each day. Reading food labels   The Nutrition Facts label lists the amount of sodium in one serving of the food. If you eat more than one serving, you must multiply the listed amount of sodium by the number of servings.  Choose foods with less than 140 mg of sodium per serving.  Avoid foods with 300 mg of sodium or more per serving. Shopping  Look for lower-sodium products, often labeled as "low-sodium" or "no salt added."  Always check the sodium content even if foods are labeled as "unsalted" or "no salt added".  Buy fresh foods. ? Avoid canned foods and premade or frozen meals. ? Avoid canned, cured, or processed meats  Buy breads that have less than 80 mg of sodium per slice. Cooking  Eat more home-cooked food and less restaurant, buffet, and fast food.  Avoid adding salt when cooking. Use salt-free seasonings or herbs instead of table salt or sea salt. Check with your health care provider or pharmacist before using salt substitutes.  Cook with plant-based oils, such as canola, sunflower, or olive oil. Meal planning  When eating at a restaurant, ask that your food be prepared with less salt or no salt, if  possible.  Avoid foods that contain MSG (monosodium glutamate). MSG is sometimes added to Chinese food, bouillon, and some canned foods. What foods are recommended? The items listed may not be a complete list. Talk with your dietitian about what dietary choices are best for you. Grains Low-sodium cereals, including oats, puffed wheat and rice, and shredded wheat. Low-sodium crackers. Unsalted rice. Unsalted pasta. Low-sodium bread. Whole-grain breads and whole-grain pasta. Vegetables Fresh or frozen vegetables. "No salt added" canned vegetables. "No salt added" tomato sauce and paste. Low-sodium or reduced-sodium tomato and vegetable juice. Fruits Fresh, frozen, or canned fruit. Fruit juice. Meats and other protein foods Fresh or frozen (no salt added) meat, poultry, seafood, and fish. Low-sodium canned tuna and salmon. Unsalted nuts. Dried peas, beans, and lentils without added salt. Unsalted canned beans. Eggs. Unsalted nut butters. Dairy Milk. Soy milk. Cheese that is naturally low in sodium, such as ricotta cheese, fresh mozzarella, or Swiss cheese Low-sodium or reduced-sodium cheese. Cream cheese. Yogurt. Fats and oils Unsalted butter. Unsalted margarine with no trans fat. Vegetable oils such as canola or olive oils. Seasonings and other foods Fresh and dried herbs and spices. Salt-free seasonings. Low-sodium mustard and ketchup. Sodium-free salad dressing. Sodium-free light mayonnaise. Fresh or refrigerated horseradish. Lemon juice. Vinegar. Homemade, reduced-sodium, or low-sodium soups. Unsalted popcorn and pretzels. Low-salt or salt-free chips. What foods are not recommended? The items listed may not be a complete list. Talk with your dietitian about what dietary choices are best for you. Grains Instant hot   cereals. Bread stuffing, pancake, and biscuit mixes. Croutons. Seasoned rice or pasta mixes. Noodle soup cups. Boxed or frozen macaroni and cheese. Regular salted crackers.  Self-rising flour. Vegetables Sauerkraut, pickled vegetables, and relishes. Olives. [place] fries. Onion rings. Regular canned vegetables (not low-sodium or reduced-sodium). Regular canned tomato sauce and paste (not low-sodium or reduced-sodium). Regular tomato and vegetable juice (not low-sodium or reduced-sodium). Frozen vegetables in sauces. Meats and other protein foods Meat or fish that is salted, canned, smoked, spiced, or pickled. Bacon, ham, sausage, hotdogs, corned beef, chipped beef, packaged lunch meats, salt pork, jerky, pickled herring, anchovies, regular canned tuna, sardines, salted nuts. Dairy Processed cheese and cheese spreads. Cheese curds. Blue cheese. Feta cheese. String cheese. Regular cottage cheese. Buttermilk. Canned milk. Fats and oils Salted butter. Regular margarine. Ghee. Bacon fat. Seasonings and other foods Onion salt, garlic salt, seasoned salt, table salt, and sea salt. Canned and packaged gravies. Worcestershire sauce. Tartar sauce. Barbecue sauce. Teriyaki sauce. Soy sauce, including reduced-sodium. Steak sauce. Fish sauce. Oyster sauce. Cocktail sauce. Horseradish that you find on the shelf. Regular ketchup and mustard. Meat flavorings and tenderizers. Bouillon cubes. Hot sauce and Tabasco sauce. Premade or packaged marinades. Premade or packaged taco seasonings. Relishes. Regular salad dressings. Salsa. Potato and tortilla chips. Corn chips and puffs. Salted popcorn and pretzels. Canned or dried soups. Pizza. Frozen entrees and pot pies. Summary  Eating less sodium can help lower your blood pressure, reduce swelling, and protect your heart, liver, and kidneys.  Most people on this plan should limit their sodium intake to 1,500-2,000 mg (milligrams) of sodium each day.  Canned, boxed, and frozen foods are high in sodium. Restaurant foods, fast foods, and pizza are also very high in sodium. You also get sodium by adding salt to food.  Try to cook at home, eat  more fresh fruits and vegetables, and eat less fast food, canned, processed, or prepared foods. This information is not intended to replace advice given to you by your health care provider. Make sure you discuss any questions you have with your health care provider. Document Released: 08/22/2001 Document Revised: 02/12/2017 Document Reviewed: 02/24/2016 Elsevier Patient Education  2020 [employer].

## 2018-11-08 ENCOUNTER — Encounter: Payer: Self-pay | Admitting: Family

## 2018-11-30 NOTE — Progress Notes (Signed)
Patient ID: Christopher Snow, male    DOB: 09-05-1940, 78 y.o.   MRN: [id number]  HPI  Christopher Snow is a 78 y/o male with a history of DM, hyperlipidemia, HTN, MI with resultant CABG, previous tobacco use and chronic heart failure.   Echo report from 08/27/2018 reviewed and showed an EF of 35-40%  Admitted 08/26/2018 due to acute heart failure. Cardiology consult obtained.Initially give IV lasix and then transitioned to oral diuretics. Discharged after 3 days.   He presents today for a follow-up visit with a chief complaint of minimal shortness of breath on moderate exertion. [place] and was slightly short of breath. Associated with chronic fatigue, and occasional lightheadedness. He denies chest pain, cough, leg swelling, and trouble sleeping. He watches his 80 year old and 80 year old grandchildren every other day. He weighs himself daily and he states his weight fluctuates between 2 pounds. He has decreased the amount of salt he adds to food although he states he could do better. His blood sugars have been running between 100s-200s, although it has gone up into the 300s occasionally and has arm pain associated when this happens.   Past Medical History:  Diagnosis Date  . CHF (congestive heart failure) (HCC)   . Diabetes mellitus without complication (HCC)   . Heart attack (HCC)   . Hyperlipidemia   . Hypertension    Past Surgical History:  Procedure Laterality Date  . APPENDECTOMY    . CARDIAC SURGERY     five bypass  . CORONARY ARTERY BYPASS GRAFT    . HERNIA REPAIR    . PR VEIN BYPASS GRAFT,AORTO-FEM-POP     Family History  Problem Relation Age of Onset  . Heart disease Mother   . Heart disease Father   . Diabetes Maternal Grandmother   . Diabetes Maternal Grandfather    Social History   Tobacco Use  . Smoking status: Former Games developermoker  . Smokeless tobacco: Never Used  Substance Use Topics  . Alcohol use: No    Frequency: Never    Allergies  Allergen Reactions  . Statins Other (See Comments)   Prior to Admission medications   Medication Sig Start Date End Date Taking? Authorizing Provider  albuterol (VENTOLIN HFA) 108 (90 Base) MCG/ACT inhaler 2 puffs q.i.d. p.r.n. short of breath, wheezing, or cough 01/14/18  Yes [provider]  aspirin EC 81 MG tablet Take 81 mg by mouth daily.   Yes [provider]  atorvastatin (LIPITOR) 10 MG tablet Take 5 mg by mouth daily at 6 PM.   Yes [provider]  carvedilol (COREG) 3.125 MG tablet Take 1 tablet (3.125 mg total) by mouth 2 (two) times daily with a meal. 08/29/18  Yes Shaune Pollackhen, Qing, MD  fluticasone [facility](FLONASE) 50 MCG/ACT nasal spray Place 2 sprays into both nostrils daily. 06/15/17  Yes [provider]  furosemide (LASIX) 40 MG tablet Take 1 tablet (40 mg total) by mouth daily. 08/29/18 12/05/18 Yes Shaune Pollackhen, Qing, MD  glipiZIDE (GLUCOTROL) 10 MG tablet Take 10 mg by mouth 2 (two) times daily before a meal.  02/16/17  Yes [provider]  isosorbide mononitrate (IMDUR) 30 MG 24 hr tablet TAKE ONE TABLET BY MOUTH ONCE DAILY 07/27/18  Yes [provider]  losartan (COZAAR) 25 MG tablet Take 2 tablets (50 mg total) by mouth daily. 12/01/17  Yes Enedina FinnerPatel, Sona, MD  metFORMIN (GLUCOPHAGE) 1000 MG tablet Take 1,000 mg by mouth  2 (two) times daily with a meal.   Yes [provider]  nitroGLYCERIN (NITROSTAT) 0.4 MG SL tablet Place 1 tablet (0.4 mg total) under the tongue every 5 (five) minutes as needed for chest pain. 08/29/18  Yes Demetrios Loll, MD     Review of Systems  Constitutional: Positive for fatigue (chronic-minimal). Negative for appetite change.  HENT: Positive for hearing loss. Negative for congestion, postnasal drip and sore throat.   Eyes: Negative.   Respiratory: Positive for shortness of breath (at times). Negative for cough.   Cardiovascular: Negative for chest pain, palpitations and leg swelling.  Gastrointestinal:  Negative for abdominal distention and abdominal pain.  Endocrine: Negative.   Genitourinary: Negative.   Musculoskeletal: Positive for myalgias (arm pain occasionally). Negative for back pain.  Skin: Negative.   Allergic/Immunologic: Negative.   Neurological: Positive for light-headedness (at times). Negative for dizziness.  Hematological: Negative for adenopathy. Does not bruise/bleed easily.  Psychiatric/Behavioral: Negative for dysphoric mood and sleep disturbance (sleeping on 1 pillow). The patient is not nervous/anxious.    Vitals:   12/05/18 1052  BP: 134/64  Pulse: (!) 57  Resp: 18  SpO2: 100%   Filed Weights   12/05/18 1052  Weight: 179 lb 6 oz (81.4 kg)   Lab Results  Component Value Date   CREATININE 1.06 08/29/2018   CREATININE 1.24 08/28/2018   CREATININE 1.26 (H) 08/27/2018    Physical Exam Vitals signs and nursing note reviewed.  Constitutional:      Appearance: Normal appearance.  HENT:     Head: Normocephalic and atraumatic.     Right Ear: Decreased hearing noted.     Left Ear: Decreased hearing noted.  Neck:     Musculoskeletal: Normal range of motion and neck supple.  Cardiovascular:     Rate and Rhythm: Regular rhythm. Bradycardia present.  Pulmonary:     Effort: Pulmonary effort is normal. No respiratory distress.     Breath sounds: No wheezing or rales.  Abdominal:     General: There is no distension.     Palpations: Abdomen is soft.  Musculoskeletal:        General: No tenderness.     Right lower leg: No edema.     Left lower leg: No edema.  Skin:    General: Skin is warm and dry.  Neurological:     General: No focal deficit present.     Mental Status: He is alert.  Psychiatric:        Mood and Affect: Mood normal.        Behavior: Behavior normal.        Thought Content: Thought content normal.    Assessment & Plan:  1: Chronic heart failure with reduced ejection fraction- - NYHA class II - euvolemic today - weighing daily; he  was reminded to call for an overnight weight gain of >2 pounds or a weekly weight gain of >5 pounds - weight up 5 pounds from last visit 1 month ago - has decreased the amount of salt he adds to food although he still does occasionally. He has been trying to read food labels. Reviewed the importance of not adding salt and to try to keep his daily sodium intake to 2000mg .  - Stop losartan  - Start entresto 24-26mg  PO BID. Medication voucher given for free 30 days and talked about medication assistance fund from [employer] if needed.  - Check BMP next visit - discussed adding farxiga in the future - history of bradycardia  so may not be able to titrate up carvedilol  - saw cardiology Mellissa Kohut) 09/05/2018 - BNP 08/26/2018 was 1711.0  2: HTN- - BP looks good today - saw PCP (Tumey) 10/11/2018 - BMP 08/29/2018 reviewed and showed sodium 135, potassium 4.2, creatinine 1.06 and GFR >60  3: DM- - A1c 08/26/2018 was 7.7% - glucose running 100-200; has been up in the 300's when having arm pain - follows with endocrinologist at the [facility]  4: Myalgia- - arm pain/ leg pain (occasionally)  - unsure if symptoms has been present since he's been taking his statin - encouraged him to discuss this with his cardiologist  Patient did not bring medications nor a list. Each medication was verbally reviewed with the patient and was encouraged to bring the bottles to every visit to confirm accuracy of the list.   Return in 3 weeks or sooner for any questions/problems before then.

## 2018-12-05 ENCOUNTER — Other Ambulatory Visit: Payer: Self-pay

## 2018-12-05 ENCOUNTER — Encounter: Payer: Self-pay | Admitting: Family

## 2018-12-05 ENCOUNTER — Ambulatory Visit: Payer: PPO | Attending: Family | Admitting: Family

## 2018-12-05 VITALS — BP 134/64 | HR 57 | Resp 18 | Ht 72.0 in | Wt 179.4 lb

## 2018-12-05 DIAGNOSIS — E119 Type 2 diabetes mellitus without complications: Secondary | ICD-10-CM | POA: Diagnosis not present

## 2018-12-05 DIAGNOSIS — Z888 Allergy status to other drugs, medicaments and biological substances status: Secondary | ICD-10-CM | POA: Diagnosis not present

## 2018-12-05 DIAGNOSIS — I110 Hypertensive heart disease with heart failure: Secondary | ICD-10-CM | POA: Diagnosis not present

## 2018-12-05 DIAGNOSIS — Z8249 Family history of ischemic heart disease and other diseases of the circulatory system: Secondary | ICD-10-CM | POA: Diagnosis not present

## 2018-12-05 DIAGNOSIS — M79603 Pain in arm, unspecified: Secondary | ICD-10-CM | POA: Insufficient documentation

## 2018-12-05 DIAGNOSIS — I252 Old myocardial infarction: Secondary | ICD-10-CM | POA: Diagnosis not present

## 2018-12-05 DIAGNOSIS — Z79899 Other long term (current) drug therapy: Secondary | ICD-10-CM | POA: Insufficient documentation

## 2018-12-05 DIAGNOSIS — I5022 Chronic systolic (congestive) heart failure: Secondary | ICD-10-CM | POA: Diagnosis not present

## 2018-12-05 DIAGNOSIS — Z833 Family history of diabetes mellitus: Secondary | ICD-10-CM | POA: Diagnosis not present

## 2018-12-05 DIAGNOSIS — Z87891 Personal history of nicotine dependence: Secondary | ICD-10-CM | POA: Insufficient documentation

## 2018-12-05 DIAGNOSIS — E785 Hyperlipidemia, unspecified: Secondary | ICD-10-CM | POA: Insufficient documentation

## 2018-12-05 DIAGNOSIS — I10 Essential (primary) hypertension: Secondary | ICD-10-CM

## 2018-12-05 DIAGNOSIS — Z7984 Long term (current) use of oral hypoglycemic drugs: Secondary | ICD-10-CM | POA: Insufficient documentation

## 2018-12-05 DIAGNOSIS — Z951 Presence of aortocoronary bypass graft: Secondary | ICD-10-CM | POA: Insufficient documentation

## 2018-12-05 DIAGNOSIS — M79606 Pain in leg, unspecified: Secondary | ICD-10-CM | POA: Insufficient documentation

## 2018-12-05 DIAGNOSIS — Z7982 Long term (current) use of aspirin: Secondary | ICD-10-CM | POA: Diagnosis not present

## 2018-12-05 DIAGNOSIS — M791 Myalgia, unspecified site: Secondary | ICD-10-CM

## 2018-12-05 MED ORDER — SACUBITRIL-VALSARTAN 24-26 MG PO TABS: 1.0000 | ORAL_TABLET | Freq: Two times a day (BID) | ORAL | 3 refills | Status: DC

## 2018-12-05 NOTE — Patient Instructions (Addendum)
Continue weighing daily and call for an overnight weight gain of > 2 pounds or a weekly weight gain of >5 pounds.  Stop losartan  Start entresto 24-26mg 1 pill twice a day.  

## 2018-12-16 DIAGNOSIS — Z23 Encounter for immunization: Secondary | ICD-10-CM | POA: Diagnosis not present

## 2018-12-26 ENCOUNTER — Ambulatory Visit: Payer: No Typology Code available for payment source | Admitting: Family

## 2018-12-27 DIAGNOSIS — I10 Essential (primary) hypertension: Secondary | ICD-10-CM | POA: Diagnosis not present

## 2018-12-27 DIAGNOSIS — I739 Peripheral vascular disease, unspecified: Secondary | ICD-10-CM | POA: Diagnosis not present

## 2018-12-27 DIAGNOSIS — Z0000 Encounter for general adult medical examination without abnormal findings: Secondary | ICD-10-CM | POA: Diagnosis not present

## 2018-12-27 DIAGNOSIS — I5043 Acute on chronic combined systolic (congestive) and diastolic (congestive) heart failure: Secondary | ICD-10-CM | POA: Diagnosis not present

## 2018-12-27 DIAGNOSIS — E7849 Other hyperlipidemia: Secondary | ICD-10-CM | POA: Diagnosis not present

## 2018-12-27 DIAGNOSIS — E119 Type 2 diabetes mellitus without complications: Secondary | ICD-10-CM | POA: Diagnosis not present

## 2019-01-16 ENCOUNTER — Ambulatory Visit: Payer: PPO | Admitting: Family

## 2019-04-06 DIAGNOSIS — Z79899 Other long term (current) drug therapy: Secondary | ICD-10-CM | POA: Diagnosis not present

## 2019-04-10 DIAGNOSIS — E875 Hyperkalemia: Secondary | ICD-10-CM | POA: Diagnosis not present

## 2019-05-05 DIAGNOSIS — R7309 Other abnormal glucose: Secondary | ICD-10-CM | POA: Diagnosis not present

## 2019-05-05 DIAGNOSIS — L0390 Cellulitis, unspecified: Secondary | ICD-10-CM | POA: Diagnosis not present

## 2019-05-05 DIAGNOSIS — E119 Type 2 diabetes mellitus without complications: Secondary | ICD-10-CM | POA: Diagnosis not present

## 2019-05-11 DIAGNOSIS — E7849 Other hyperlipidemia: Secondary | ICD-10-CM | POA: Diagnosis not present

## 2019-05-11 DIAGNOSIS — I5022 Chronic systolic (congestive) heart failure: Secondary | ICD-10-CM | POA: Diagnosis not present

## 2019-05-11 DIAGNOSIS — I10 Essential (primary) hypertension: Secondary | ICD-10-CM | POA: Diagnosis not present

## 2019-05-11 DIAGNOSIS — I2510 Atherosclerotic heart disease of native coronary artery without angina pectoris: Secondary | ICD-10-CM | POA: Diagnosis not present

## 2019-08-22 DIAGNOSIS — R6889 Other general symptoms and signs: Secondary | ICD-10-CM | POA: Diagnosis not present

## 2019-08-22 DIAGNOSIS — L03811 Cellulitis of head [any part, except face]: Secondary | ICD-10-CM | POA: Diagnosis not present

## 2019-11-10 DIAGNOSIS — H6693 Otitis media, unspecified, bilateral: Secondary | ICD-10-CM | POA: Diagnosis not present

## 2019-11-14 DIAGNOSIS — R21 Rash and other nonspecific skin eruption: Secondary | ICD-10-CM | POA: Diagnosis not present

## 2019-11-14 DIAGNOSIS — B49 Unspecified mycosis: Secondary | ICD-10-CM | POA: Diagnosis not present

## 2019-11-14 DIAGNOSIS — H6090 Unspecified otitis externa, unspecified ear: Secondary | ICD-10-CM | POA: Diagnosis not present

## 2019-11-15 DIAGNOSIS — B356 Tinea cruris: Secondary | ICD-10-CM | POA: Diagnosis not present

## 2019-11-15 DIAGNOSIS — H60392 Other infective otitis externa, left ear: Secondary | ICD-10-CM | POA: Diagnosis not present

## 2019-11-23 DIAGNOSIS — H60333 Swimmer's ear, bilateral: Secondary | ICD-10-CM | POA: Diagnosis not present

## 2019-11-23 DIAGNOSIS — H6123 Impacted cerumen, bilateral: Secondary | ICD-10-CM | POA: Diagnosis not present

## 2019-12-22 DIAGNOSIS — Z23 Encounter for immunization: Secondary | ICD-10-CM | POA: Diagnosis not present

## 2020-03-07 DIAGNOSIS — E7849 Other hyperlipidemia: Secondary | ICD-10-CM | POA: Diagnosis not present

## 2020-03-07 DIAGNOSIS — Z0000 Encounter for general adult medical examination without abnormal findings: Secondary | ICD-10-CM | POA: Diagnosis not present

## 2020-03-07 DIAGNOSIS — I2510 Atherosclerotic heart disease of native coronary artery without angina pectoris: Secondary | ICD-10-CM | POA: Diagnosis not present

## 2020-03-07 DIAGNOSIS — I739 Peripheral vascular disease, unspecified: Secondary | ICD-10-CM | POA: Diagnosis not present

## 2020-03-07 DIAGNOSIS — E119 Type 2 diabetes mellitus without complications: Secondary | ICD-10-CM | POA: Diagnosis not present

## 2020-03-07 DIAGNOSIS — I10 Essential (primary) hypertension: Secondary | ICD-10-CM | POA: Diagnosis not present

## 2020-03-28 ENCOUNTER — Emergency Department: Payer: No Typology Code available for payment source

## 2020-03-28 ENCOUNTER — Other Ambulatory Visit: Payer: Self-pay

## 2020-03-28 DIAGNOSIS — Z5321 Procedure and treatment not carried out due to patient leaving prior to being seen by health care provider: Secondary | ICD-10-CM | POA: Diagnosis not present

## 2020-03-28 DIAGNOSIS — E11649 Type 2 diabetes mellitus with hypoglycemia without coma: Secondary | ICD-10-CM | POA: Diagnosis not present

## 2020-03-28 DIAGNOSIS — R509 Fever, unspecified: Secondary | ICD-10-CM | POA: Diagnosis not present

## 2020-03-28 DIAGNOSIS — R809 Proteinuria, unspecified: Secondary | ICD-10-CM | POA: Diagnosis not present

## 2020-03-28 DIAGNOSIS — R0602 Shortness of breath: Secondary | ICD-10-CM | POA: Insufficient documentation

## 2020-03-28 DIAGNOSIS — J069 Acute upper respiratory infection, unspecified: Secondary | ICD-10-CM | POA: Diagnosis not present

## 2020-03-28 DIAGNOSIS — R81 Glycosuria: Secondary | ICD-10-CM | POA: Diagnosis not present

## 2020-03-28 LAB — COMPREHENSIVE METABOLIC PANEL
ALT: 25 U/L (ref 0–44)
AST: 33 U/L (ref 15–41)
Albumin: 3.9 g/dL (ref 3.5–5.0)
Alkaline Phosphatase: 109 U/L (ref 38–126)
Anion gap: 11 (ref 5–15)
BUN: 26 mg/dL — ABNORMAL HIGH (ref 8–23)
CO2: 26 mmol/L (ref 22–32)
Calcium: 8.5 mg/dL — ABNORMAL LOW (ref 8.9–10.3)
Chloride: 96 mmol/L — ABNORMAL LOW (ref 98–111)
Creatinine, Ser: 1.21 mg/dL (ref 0.61–1.24)
GFR, Estimated: >60 mL/min (ref >60)
Glucose, Bld: 139 mg/dL — ABNORMAL HIGH (ref 70–99)
Potassium: 3.5 mmol/L (ref 3.5–5.1)
Sodium: 133 mmol/L — ABNORMAL LOW (ref 135–145)
Total Bilirubin: 1.3 mg/dL — ABNORMAL HIGH (ref 0.3–1.2)
Total Protein: 7.7 g/dL (ref 6.5–8.1)

## 2020-03-28 LAB — TROPONIN I (HIGH SENSITIVITY): Troponin I (High Sensitivity): 84 ng/L — ABNORMAL HIGH (ref <18)

## 2020-03-28 LAB — CBC
HCT: 43.4 % (ref 39.0–52.0)
Hemoglobin: 15.2 g/dL (ref 13.0–17.0)
MCH: 30.5 pg (ref 26.0–34.0)
MCHC: 35 g/dL (ref 30.0–36.0)
MCV: 87 fL (ref 80.0–100.0)
Platelets: 142 10*3/uL — ABNORMAL LOW (ref 150–400)
RBC: 4.99 MIL/uL (ref 4.22–5.81)
RDW: 13.9 % (ref 11.5–15.5)
WBC: 11.9 10*3/uL — ABNORMAL HIGH (ref 4.0–10.5)
nRBC: 0 % (ref 0.0–0.2)

## 2020-03-28 LAB — CBG MONITORING, ED: Glucose-Capillary: 124 mg/dL — ABNORMAL HIGH (ref 70–99)

## 2020-03-28 LAB — LACTIC ACID, PLASMA: Lactic Acid, Venous: 2 mmol/L (ref 0.5–1.9)

## 2020-03-28 NOTE — ED Notes (Signed)
When pt goes back to room call daughter [name] to get more info.

## 2020-03-28 NOTE — ED Triage Notes (Signed)
Pt brought in by [facility] for shob and fever per pt. Pt lives at home with wife and she called EMS.

## 2020-03-28 NOTE — ED Triage Notes (Signed)
EMS brings pt in from home for hypoglycemia

## 2020-03-29 ENCOUNTER — Emergency Department
Admission: EM | Admit: 2020-03-29 | Discharge: 2020-03-29 | Disposition: A | Discharge status: Home / Self Care | Payer: No Typology Code available for payment source | Attending: Emergency Medicine | Admitting: Emergency Medicine

## 2020-03-29 LAB — LACTIC ACID, PLASMA: Lactic Acid, Venous: 1.6 mmol/L (ref 0.5–1.9)

## 2020-03-29 LAB — TROPONIN I (HIGH SENSITIVITY): Troponin I (High Sensitivity): 68 ng/L — ABNORMAL HIGH (ref <18)

## 2020-03-31 DIAGNOSIS — I4510 Unspecified right bundle-branch block: Secondary | ICD-10-CM | POA: Diagnosis not present

## 2020-03-31 DIAGNOSIS — R0902 Hypoxemia: Secondary | ICD-10-CM | POA: Diagnosis not present

## 2020-03-31 DIAGNOSIS — I10 Essential (primary) hypertension: Secondary | ICD-10-CM | POA: Diagnosis not present

## 2020-03-31 DIAGNOSIS — R531 Weakness: Secondary | ICD-10-CM | POA: Diagnosis not present

## 2020-04-01 DIAGNOSIS — E871 Hypo-osmolality and hyponatremia: Secondary | ICD-10-CM | POA: Diagnosis not present

## 2020-04-01 DIAGNOSIS — Z87891 Personal history of nicotine dependence: Secondary | ICD-10-CM | POA: Diagnosis not present

## 2020-04-01 DIAGNOSIS — E785 Hyperlipidemia, unspecified: Secondary | ICD-10-CM | POA: Diagnosis not present

## 2020-04-01 DIAGNOSIS — R918 Other nonspecific abnormal finding of lung field: Secondary | ICD-10-CM | POA: Diagnosis not present

## 2020-04-01 DIAGNOSIS — Z9049 Acquired absence of other specified parts of digestive tract: Secondary | ICD-10-CM | POA: Diagnosis not present

## 2020-04-01 DIAGNOSIS — R0902 Hypoxemia: Secondary | ICD-10-CM | POA: Diagnosis not present

## 2020-04-01 DIAGNOSIS — B974 Respiratory syncytial virus as the cause of diseases classified elsewhere: Secondary | ICD-10-CM | POA: Diagnosis not present

## 2020-04-01 DIAGNOSIS — R042 Hemoptysis: Secondary | ICD-10-CM | POA: Diagnosis not present

## 2020-04-01 DIAGNOSIS — U071 COVID-19: Secondary | ICD-10-CM | POA: Diagnosis not present

## 2020-04-01 DIAGNOSIS — K8020 Calculus of gallbladder without cholecystitis without obstruction: Secondary | ICD-10-CM | POA: Diagnosis not present

## 2020-04-01 DIAGNOSIS — N400 Enlarged prostate without lower urinary tract symptoms: Secondary | ICD-10-CM | POA: Diagnosis not present

## 2020-04-01 DIAGNOSIS — E1151 Type 2 diabetes mellitus with diabetic peripheral angiopathy without gangrene: Secondary | ICD-10-CM | POA: Diagnosis not present

## 2020-04-01 DIAGNOSIS — Z951 Presence of aortocoronary bypass graft: Secondary | ICD-10-CM | POA: Diagnosis not present

## 2020-04-01 DIAGNOSIS — R0600 Dyspnea, unspecified: Secondary | ICD-10-CM | POA: Diagnosis not present

## 2020-04-01 DIAGNOSIS — D6869 Other thrombophilia: Secondary | ICD-10-CM | POA: Diagnosis not present

## 2020-04-01 DIAGNOSIS — J1282 Pneumonia due to coronavirus disease 2019: Secondary | ICD-10-CM | POA: Diagnosis not present

## 2020-04-01 DIAGNOSIS — R509 Fever, unspecified: Secondary | ICD-10-CM | POA: Diagnosis not present

## 2020-04-01 DIAGNOSIS — Z7982 Long term (current) use of aspirin: Secondary | ICD-10-CM | POA: Diagnosis not present

## 2020-04-01 DIAGNOSIS — E1122 Type 2 diabetes mellitus with diabetic chronic kidney disease: Secondary | ICD-10-CM | POA: Diagnosis not present

## 2020-04-01 DIAGNOSIS — R059 Cough, unspecified: Secondary | ICD-10-CM | POA: Diagnosis not present

## 2020-04-01 DIAGNOSIS — N189 Chronic kidney disease, unspecified: Secondary | ICD-10-CM | POA: Diagnosis not present

## 2020-04-01 DIAGNOSIS — G3184 Mild cognitive impairment, so stated: Secondary | ICD-10-CM | POA: Diagnosis not present

## 2020-04-01 DIAGNOSIS — K5730 Diverticulosis of large intestine without perforation or abscess without bleeding: Secondary | ICD-10-CM | POA: Diagnosis not present

## 2020-04-01 DIAGNOSIS — I130 Hypertensive heart and chronic kidney disease with heart failure and stage 1 through stage 4 chronic kidney disease, or unspecified chronic kidney disease: Secondary | ICD-10-CM | POA: Diagnosis not present

## 2020-04-01 DIAGNOSIS — J9601 Acute respiratory failure with hypoxia: Secondary | ICD-10-CM | POA: Diagnosis not present

## 2020-04-01 DIAGNOSIS — I255 Ischemic cardiomyopathy: Secondary | ICD-10-CM | POA: Diagnosis not present

## 2020-04-01 DIAGNOSIS — Z79899 Other long term (current) drug therapy: Secondary | ICD-10-CM | POA: Diagnosis not present

## 2020-04-01 DIAGNOSIS — I5020 Unspecified systolic (congestive) heart failure: Secondary | ICD-10-CM | POA: Diagnosis not present

## 2020-04-01 DIAGNOSIS — Z7984 Long term (current) use of oral hypoglycemic drugs: Secondary | ICD-10-CM | POA: Diagnosis not present

## 2020-04-01 DIAGNOSIS — K5900 Constipation, unspecified: Secondary | ICD-10-CM | POA: Diagnosis not present

## 2020-04-01 DIAGNOSIS — I2510 Atherosclerotic heart disease of native coronary artery without angina pectoris: Secondary | ICD-10-CM | POA: Diagnosis not present

## 2020-04-02 DIAGNOSIS — J1282 Pneumonia due to coronavirus disease 2019: Secondary | ICD-10-CM | POA: Diagnosis not present

## 2020-04-02 DIAGNOSIS — I2510 Atherosclerotic heart disease of native coronary artery without angina pectoris: Secondary | ICD-10-CM | POA: Diagnosis not present

## 2020-04-02 DIAGNOSIS — E1122 Type 2 diabetes mellitus with diabetic chronic kidney disease: Secondary | ICD-10-CM | POA: Diagnosis not present

## 2020-04-02 DIAGNOSIS — U071 COVID-19: Secondary | ICD-10-CM | POA: Diagnosis not present

## 2020-04-02 DIAGNOSIS — J9601 Acute respiratory failure with hypoxia: Secondary | ICD-10-CM | POA: Diagnosis not present

## 2020-04-02 DIAGNOSIS — N189 Chronic kidney disease, unspecified: Secondary | ICD-10-CM | POA: Diagnosis not present

## 2020-04-02 DIAGNOSIS — I130 Hypertensive heart and chronic kidney disease with heart failure and stage 1 through stage 4 chronic kidney disease, or unspecified chronic kidney disease: Secondary | ICD-10-CM | POA: Diagnosis not present

## 2020-04-02 DIAGNOSIS — I5020 Unspecified systolic (congestive) heart failure: Secondary | ICD-10-CM | POA: Diagnosis not present

## 2020-04-03 DIAGNOSIS — J9601 Acute respiratory failure with hypoxia: Secondary | ICD-10-CM | POA: Diagnosis not present

## 2020-04-03 DIAGNOSIS — I130 Hypertensive heart and chronic kidney disease with heart failure and stage 1 through stage 4 chronic kidney disease, or unspecified chronic kidney disease: Secondary | ICD-10-CM | POA: Diagnosis not present

## 2020-04-03 DIAGNOSIS — U071 COVID-19: Secondary | ICD-10-CM | POA: Diagnosis not present

## 2020-04-03 DIAGNOSIS — E1122 Type 2 diabetes mellitus with diabetic chronic kidney disease: Secondary | ICD-10-CM | POA: Diagnosis not present

## 2020-04-03 DIAGNOSIS — I5020 Unspecified systolic (congestive) heart failure: Secondary | ICD-10-CM | POA: Diagnosis not present

## 2020-04-03 DIAGNOSIS — N189 Chronic kidney disease, unspecified: Secondary | ICD-10-CM | POA: Diagnosis not present

## 2020-04-03 DIAGNOSIS — J1282 Pneumonia due to coronavirus disease 2019: Secondary | ICD-10-CM | POA: Diagnosis not present

## 2020-04-04 DIAGNOSIS — K8020 Calculus of gallbladder without cholecystitis without obstruction: Secondary | ICD-10-CM | POA: Diagnosis not present

## 2020-04-04 DIAGNOSIS — I5020 Unspecified systolic (congestive) heart failure: Secondary | ICD-10-CM | POA: Diagnosis not present

## 2020-04-04 DIAGNOSIS — J1282 Pneumonia due to coronavirus disease 2019: Secondary | ICD-10-CM | POA: Diagnosis not present

## 2020-04-04 DIAGNOSIS — I130 Hypertensive heart and chronic kidney disease with heart failure and stage 1 through stage 4 chronic kidney disease, or unspecified chronic kidney disease: Secondary | ICD-10-CM | POA: Diagnosis not present

## 2020-04-04 DIAGNOSIS — E1122 Type 2 diabetes mellitus with diabetic chronic kidney disease: Secondary | ICD-10-CM | POA: Diagnosis not present

## 2020-04-04 DIAGNOSIS — K5730 Diverticulosis of large intestine without perforation or abscess without bleeding: Secondary | ICD-10-CM | POA: Diagnosis not present

## 2020-04-04 DIAGNOSIS — R918 Other nonspecific abnormal finding of lung field: Secondary | ICD-10-CM | POA: Diagnosis not present

## 2020-04-04 DIAGNOSIS — R042 Hemoptysis: Secondary | ICD-10-CM | POA: Diagnosis not present

## 2020-04-04 DIAGNOSIS — U071 COVID-19: Secondary | ICD-10-CM | POA: Diagnosis not present

## 2020-04-04 DIAGNOSIS — N189 Chronic kidney disease, unspecified: Secondary | ICD-10-CM | POA: Diagnosis not present

## 2020-04-04 DIAGNOSIS — J9601 Acute respiratory failure with hypoxia: Secondary | ICD-10-CM | POA: Diagnosis not present

## 2020-04-05 DIAGNOSIS — K5900 Constipation, unspecified: Secondary | ICD-10-CM | POA: Diagnosis not present

## 2020-04-05 DIAGNOSIS — J9601 Acute respiratory failure with hypoxia: Secondary | ICD-10-CM | POA: Diagnosis not present

## 2020-04-05 DIAGNOSIS — U071 COVID-19: Secondary | ICD-10-CM | POA: Diagnosis not present

## 2020-04-05 DIAGNOSIS — E871 Hypo-osmolality and hyponatremia: Secondary | ICD-10-CM | POA: Diagnosis not present

## 2020-04-05 DIAGNOSIS — J1282 Pneumonia due to coronavirus disease 2019: Secondary | ICD-10-CM | POA: Diagnosis not present

## 2020-04-06 DIAGNOSIS — J9601 Acute respiratory failure with hypoxia: Secondary | ICD-10-CM | POA: Diagnosis not present

## 2020-04-06 DIAGNOSIS — K5900 Constipation, unspecified: Secondary | ICD-10-CM | POA: Diagnosis not present

## 2020-04-06 DIAGNOSIS — U071 COVID-19: Secondary | ICD-10-CM | POA: Diagnosis not present

## 2020-04-06 DIAGNOSIS — J1282 Pneumonia due to coronavirus disease 2019: Secondary | ICD-10-CM | POA: Diagnosis not present

## 2020-04-06 DIAGNOSIS — E871 Hypo-osmolality and hyponatremia: Secondary | ICD-10-CM | POA: Diagnosis not present

## 2020-04-07 DIAGNOSIS — U071 COVID-19: Secondary | ICD-10-CM | POA: Diagnosis not present

## 2020-04-07 DIAGNOSIS — J1282 Pneumonia due to coronavirus disease 2019: Secondary | ICD-10-CM | POA: Diagnosis not present

## 2020-04-07 DIAGNOSIS — E871 Hypo-osmolality and hyponatremia: Secondary | ICD-10-CM | POA: Diagnosis not present

## 2020-04-07 DIAGNOSIS — K5900 Constipation, unspecified: Secondary | ICD-10-CM | POA: Diagnosis not present

## 2020-04-07 DIAGNOSIS — J9601 Acute respiratory failure with hypoxia: Secondary | ICD-10-CM | POA: Diagnosis not present

## 2020-04-08 DIAGNOSIS — U071 COVID-19: Secondary | ICD-10-CM | POA: Diagnosis not present

## 2020-04-08 DIAGNOSIS — I130 Hypertensive heart and chronic kidney disease with heart failure and stage 1 through stage 4 chronic kidney disease, or unspecified chronic kidney disease: Secondary | ICD-10-CM | POA: Diagnosis not present

## 2020-04-08 DIAGNOSIS — E871 Hypo-osmolality and hyponatremia: Secondary | ICD-10-CM | POA: Diagnosis not present

## 2020-04-08 DIAGNOSIS — J1282 Pneumonia due to coronavirus disease 2019: Secondary | ICD-10-CM | POA: Diagnosis not present

## 2020-04-08 DIAGNOSIS — K5900 Constipation, unspecified: Secondary | ICD-10-CM | POA: Diagnosis not present

## 2020-04-08 DIAGNOSIS — N189 Chronic kidney disease, unspecified: Secondary | ICD-10-CM | POA: Diagnosis not present

## 2020-04-08 DIAGNOSIS — I5020 Unspecified systolic (congestive) heart failure: Secondary | ICD-10-CM | POA: Diagnosis not present

## 2020-04-08 DIAGNOSIS — J9601 Acute respiratory failure with hypoxia: Secondary | ICD-10-CM | POA: Diagnosis not present

## 2020-04-08 DIAGNOSIS — E1122 Type 2 diabetes mellitus with diabetic chronic kidney disease: Secondary | ICD-10-CM | POA: Diagnosis not present

## 2020-04-12 DIAGNOSIS — Z7984 Long term (current) use of oral hypoglycemic drugs: Secondary | ICD-10-CM | POA: Diagnosis not present

## 2020-04-12 DIAGNOSIS — Z87891 Personal history of nicotine dependence: Secondary | ICD-10-CM | POA: Diagnosis not present

## 2020-04-12 DIAGNOSIS — E1122 Type 2 diabetes mellitus with diabetic chronic kidney disease: Secondary | ICD-10-CM | POA: Diagnosis not present

## 2020-04-12 DIAGNOSIS — J9601 Acute respiratory failure with hypoxia: Secondary | ICD-10-CM | POA: Diagnosis not present

## 2020-04-12 DIAGNOSIS — Z7951 Long term (current) use of inhaled steroids: Secondary | ICD-10-CM | POA: Diagnosis not present

## 2020-04-12 DIAGNOSIS — Z951 Presence of aortocoronary bypass graft: Secondary | ICD-10-CM | POA: Diagnosis not present

## 2020-04-12 DIAGNOSIS — N39498 Other specified urinary incontinence: Secondary | ICD-10-CM | POA: Diagnosis not present

## 2020-04-12 DIAGNOSIS — K5900 Constipation, unspecified: Secondary | ICD-10-CM | POA: Diagnosis not present

## 2020-04-12 DIAGNOSIS — I255 Ischemic cardiomyopathy: Secondary | ICD-10-CM | POA: Diagnosis not present

## 2020-04-12 DIAGNOSIS — Z7982 Long term (current) use of aspirin: Secondary | ICD-10-CM | POA: Diagnosis not present

## 2020-04-12 DIAGNOSIS — Z9181 History of falling: Secondary | ICD-10-CM | POA: Diagnosis not present

## 2020-04-12 DIAGNOSIS — E1151 Type 2 diabetes mellitus with diabetic peripheral angiopathy without gangrene: Secondary | ICD-10-CM | POA: Diagnosis not present

## 2020-04-12 DIAGNOSIS — I5020 Unspecified systolic (congestive) heart failure: Secondary | ICD-10-CM | POA: Diagnosis not present

## 2020-04-12 DIAGNOSIS — N401 Enlarged prostate with lower urinary tract symptoms: Secondary | ICD-10-CM | POA: Diagnosis not present

## 2020-04-12 DIAGNOSIS — U071 COVID-19: Secondary | ICD-10-CM | POA: Diagnosis not present

## 2020-04-12 DIAGNOSIS — I2510 Atherosclerotic heart disease of native coronary artery without angina pectoris: Secondary | ICD-10-CM | POA: Diagnosis not present

## 2020-04-12 DIAGNOSIS — M419 Scoliosis, unspecified: Secondary | ICD-10-CM | POA: Diagnosis not present

## 2020-04-12 DIAGNOSIS — N189 Chronic kidney disease, unspecified: Secondary | ICD-10-CM | POA: Diagnosis not present

## 2020-04-12 DIAGNOSIS — Z8701 Personal history of pneumonia (recurrent): Secondary | ICD-10-CM | POA: Diagnosis not present

## 2020-04-12 DIAGNOSIS — M47819 Spondylosis without myelopathy or radiculopathy, site unspecified: Secondary | ICD-10-CM | POA: Diagnosis not present

## 2020-04-12 DIAGNOSIS — I129 Hypertensive chronic kidney disease with stage 1 through stage 4 chronic kidney disease, or unspecified chronic kidney disease: Secondary | ICD-10-CM | POA: Diagnosis not present

## 2020-04-12 DIAGNOSIS — Z9981 Dependence on supplemental oxygen: Secondary | ICD-10-CM | POA: Diagnosis not present

## 2020-04-12 DIAGNOSIS — H9190 Unspecified hearing loss, unspecified ear: Secondary | ICD-10-CM | POA: Diagnosis not present

## 2020-04-17 DIAGNOSIS — I2510 Atherosclerotic heart disease of native coronary artery without angina pectoris: Secondary | ICD-10-CM | POA: Diagnosis not present

## 2020-04-17 DIAGNOSIS — Z951 Presence of aortocoronary bypass graft: Secondary | ICD-10-CM | POA: Diagnosis not present

## 2020-04-17 DIAGNOSIS — M419 Scoliosis, unspecified: Secondary | ICD-10-CM | POA: Diagnosis not present

## 2020-04-17 DIAGNOSIS — I5020 Unspecified systolic (congestive) heart failure: Secondary | ICD-10-CM | POA: Diagnosis not present

## 2020-04-17 DIAGNOSIS — Z9181 History of falling: Secondary | ICD-10-CM | POA: Diagnosis not present

## 2020-04-17 DIAGNOSIS — I255 Ischemic cardiomyopathy: Secondary | ICD-10-CM | POA: Diagnosis not present

## 2020-04-17 DIAGNOSIS — K5900 Constipation, unspecified: Secondary | ICD-10-CM | POA: Diagnosis not present

## 2020-04-17 DIAGNOSIS — Z9981 Dependence on supplemental oxygen: Secondary | ICD-10-CM | POA: Diagnosis not present

## 2020-04-17 DIAGNOSIS — Z7982 Long term (current) use of aspirin: Secondary | ICD-10-CM | POA: Diagnosis not present

## 2020-04-17 DIAGNOSIS — J9601 Acute respiratory failure with hypoxia: Secondary | ICD-10-CM | POA: Diagnosis not present

## 2020-04-17 DIAGNOSIS — E1151 Type 2 diabetes mellitus with diabetic peripheral angiopathy without gangrene: Secondary | ICD-10-CM | POA: Diagnosis not present

## 2020-04-17 DIAGNOSIS — Z87891 Personal history of nicotine dependence: Secondary | ICD-10-CM | POA: Diagnosis not present

## 2020-04-17 DIAGNOSIS — N189 Chronic kidney disease, unspecified: Secondary | ICD-10-CM | POA: Diagnosis not present

## 2020-04-17 DIAGNOSIS — Z7984 Long term (current) use of oral hypoglycemic drugs: Secondary | ICD-10-CM | POA: Diagnosis not present

## 2020-04-17 DIAGNOSIS — N39498 Other specified urinary incontinence: Secondary | ICD-10-CM | POA: Diagnosis not present

## 2020-04-17 DIAGNOSIS — E1122 Type 2 diabetes mellitus with diabetic chronic kidney disease: Secondary | ICD-10-CM | POA: Diagnosis not present

## 2020-04-17 DIAGNOSIS — H9190 Unspecified hearing loss, unspecified ear: Secondary | ICD-10-CM | POA: Diagnosis not present

## 2020-04-17 DIAGNOSIS — U071 COVID-19: Secondary | ICD-10-CM | POA: Diagnosis not present

## 2020-04-17 DIAGNOSIS — Z7951 Long term (current) use of inhaled steroids: Secondary | ICD-10-CM | POA: Diagnosis not present

## 2020-04-17 DIAGNOSIS — I129 Hypertensive chronic kidney disease with stage 1 through stage 4 chronic kidney disease, or unspecified chronic kidney disease: Secondary | ICD-10-CM | POA: Diagnosis not present

## 2020-04-17 DIAGNOSIS — Z8701 Personal history of pneumonia (recurrent): Secondary | ICD-10-CM | POA: Diagnosis not present

## 2020-04-17 DIAGNOSIS — N401 Enlarged prostate with lower urinary tract symptoms: Secondary | ICD-10-CM | POA: Diagnosis not present

## 2020-04-17 DIAGNOSIS — M47819 Spondylosis without myelopathy or radiculopathy, site unspecified: Secondary | ICD-10-CM | POA: Diagnosis not present

## 2020-04-18 DIAGNOSIS — Z09 Encounter for follow-up examination after completed treatment for conditions other than malignant neoplasm: Secondary | ICD-10-CM | POA: Diagnosis not present

## 2020-04-18 DIAGNOSIS — J811 Chronic pulmonary edema: Secondary | ICD-10-CM | POA: Diagnosis not present

## 2020-04-18 DIAGNOSIS — I2510 Atherosclerotic heart disease of native coronary artery without angina pectoris: Secondary | ICD-10-CM | POA: Diagnosis not present

## 2020-04-18 DIAGNOSIS — J90 Pleural effusion, not elsewhere classified: Secondary | ICD-10-CM | POA: Diagnosis not present

## 2020-04-18 DIAGNOSIS — B379 Candidiasis, unspecified: Secondary | ICD-10-CM | POA: Diagnosis not present

## 2020-04-18 DIAGNOSIS — U071 COVID-19: Secondary | ICD-10-CM | POA: Diagnosis not present

## 2020-04-18 DIAGNOSIS — E119 Type 2 diabetes mellitus without complications: Secondary | ICD-10-CM | POA: Diagnosis not present

## 2020-04-18 DIAGNOSIS — J9611 Chronic respiratory failure with hypoxia: Secondary | ICD-10-CM | POA: Diagnosis not present

## 2020-04-18 DIAGNOSIS — G9341 Metabolic encephalopathy: Secondary | ICD-10-CM | POA: Diagnosis not present

## 2020-04-19 DIAGNOSIS — Z7982 Long term (current) use of aspirin: Secondary | ICD-10-CM | POA: Diagnosis not present

## 2020-04-19 DIAGNOSIS — Z951 Presence of aortocoronary bypass graft: Secondary | ICD-10-CM | POA: Diagnosis not present

## 2020-04-19 DIAGNOSIS — Z7984 Long term (current) use of oral hypoglycemic drugs: Secondary | ICD-10-CM | POA: Diagnosis not present

## 2020-04-19 DIAGNOSIS — Z8701 Personal history of pneumonia (recurrent): Secondary | ICD-10-CM | POA: Diagnosis not present

## 2020-04-19 DIAGNOSIS — I255 Ischemic cardiomyopathy: Secondary | ICD-10-CM | POA: Diagnosis not present

## 2020-04-19 DIAGNOSIS — M419 Scoliosis, unspecified: Secondary | ICD-10-CM | POA: Diagnosis not present

## 2020-04-19 DIAGNOSIS — I5020 Unspecified systolic (congestive) heart failure: Secondary | ICD-10-CM | POA: Diagnosis not present

## 2020-04-19 DIAGNOSIS — N189 Chronic kidney disease, unspecified: Secondary | ICD-10-CM | POA: Diagnosis not present

## 2020-04-19 DIAGNOSIS — N401 Enlarged prostate with lower urinary tract symptoms: Secondary | ICD-10-CM | POA: Diagnosis not present

## 2020-04-19 DIAGNOSIS — U071 COVID-19: Secondary | ICD-10-CM | POA: Diagnosis not present

## 2020-04-19 DIAGNOSIS — Z9181 History of falling: Secondary | ICD-10-CM | POA: Diagnosis not present

## 2020-04-19 DIAGNOSIS — I129 Hypertensive chronic kidney disease with stage 1 through stage 4 chronic kidney disease, or unspecified chronic kidney disease: Secondary | ICD-10-CM | POA: Diagnosis not present

## 2020-04-19 DIAGNOSIS — Z7951 Long term (current) use of inhaled steroids: Secondary | ICD-10-CM | POA: Diagnosis not present

## 2020-04-19 DIAGNOSIS — I2510 Atherosclerotic heart disease of native coronary artery without angina pectoris: Secondary | ICD-10-CM | POA: Diagnosis not present

## 2020-04-19 DIAGNOSIS — Z87891 Personal history of nicotine dependence: Secondary | ICD-10-CM | POA: Diagnosis not present

## 2020-04-19 DIAGNOSIS — E1151 Type 2 diabetes mellitus with diabetic peripheral angiopathy without gangrene: Secondary | ICD-10-CM | POA: Diagnosis not present

## 2020-04-19 DIAGNOSIS — H9190 Unspecified hearing loss, unspecified ear: Secondary | ICD-10-CM | POA: Diagnosis not present

## 2020-04-19 DIAGNOSIS — E1122 Type 2 diabetes mellitus with diabetic chronic kidney disease: Secondary | ICD-10-CM | POA: Diagnosis not present

## 2020-04-19 DIAGNOSIS — M47819 Spondylosis without myelopathy or radiculopathy, site unspecified: Secondary | ICD-10-CM | POA: Diagnosis not present

## 2020-04-19 DIAGNOSIS — K5900 Constipation, unspecified: Secondary | ICD-10-CM | POA: Diagnosis not present

## 2020-04-19 DIAGNOSIS — Z9981 Dependence on supplemental oxygen: Secondary | ICD-10-CM | POA: Diagnosis not present

## 2020-04-19 DIAGNOSIS — J9601 Acute respiratory failure with hypoxia: Secondary | ICD-10-CM | POA: Diagnosis not present

## 2020-04-19 DIAGNOSIS — N39498 Other specified urinary incontinence: Secondary | ICD-10-CM | POA: Diagnosis not present

## 2020-04-26 DIAGNOSIS — U071 COVID-19: Secondary | ICD-10-CM | POA: Diagnosis not present

## 2020-04-26 DIAGNOSIS — E1122 Type 2 diabetes mellitus with diabetic chronic kidney disease: Secondary | ICD-10-CM | POA: Diagnosis not present

## 2020-04-26 DIAGNOSIS — N189 Chronic kidney disease, unspecified: Secondary | ICD-10-CM | POA: Diagnosis not present

## 2020-04-26 DIAGNOSIS — I129 Hypertensive chronic kidney disease with stage 1 through stage 4 chronic kidney disease, or unspecified chronic kidney disease: Secondary | ICD-10-CM | POA: Diagnosis not present

## 2020-04-26 DIAGNOSIS — I2510 Atherosclerotic heart disease of native coronary artery without angina pectoris: Secondary | ICD-10-CM | POA: Diagnosis not present

## 2020-04-26 DIAGNOSIS — J9601 Acute respiratory failure with hypoxia: Secondary | ICD-10-CM | POA: Diagnosis not present

## 2020-04-26 DIAGNOSIS — E1151 Type 2 diabetes mellitus with diabetic peripheral angiopathy without gangrene: Secondary | ICD-10-CM | POA: Diagnosis not present

## 2020-05-09 DIAGNOSIS — U071 COVID-19: Secondary | ICD-10-CM | POA: Diagnosis not present

## 2020-05-15 DIAGNOSIS — E538 Deficiency of other specified B group vitamins: Secondary | ICD-10-CM | POA: Diagnosis not present

## 2020-05-15 DIAGNOSIS — F0150 Vascular dementia without behavioral disturbance: Secondary | ICD-10-CM | POA: Diagnosis not present

## 2020-05-15 DIAGNOSIS — G309 Alzheimer's disease, unspecified: Secondary | ICD-10-CM | POA: Diagnosis not present

## 2020-05-15 DIAGNOSIS — E559 Vitamin D deficiency, unspecified: Secondary | ICD-10-CM | POA: Diagnosis not present

## 2020-05-15 DIAGNOSIS — F0280 Dementia in other diseases classified elsewhere without behavioral disturbance: Secondary | ICD-10-CM | POA: Diagnosis not present

## 2020-05-16 ENCOUNTER — Other Ambulatory Visit: Payer: Self-pay | Admitting: Neurology

## 2020-05-16 DIAGNOSIS — F0150 Vascular dementia without behavioral disturbance: Secondary | ICD-10-CM

## 2020-05-16 DIAGNOSIS — F0280 Dementia in other diseases classified elsewhere without behavioral disturbance: Secondary | ICD-10-CM

## 2020-05-31 ENCOUNTER — Ambulatory Visit
Admission: RE | Admit: 2020-05-31 | Discharge: 2020-05-31 | Disposition: A | Discharge status: Home / Self Care | Payer: PPO | Source: Ambulatory Visit | Attending: Neurology | Admitting: Neurology

## 2020-05-31 ENCOUNTER — Other Ambulatory Visit: Payer: Self-pay

## 2020-05-31 DIAGNOSIS — F0150 Vascular dementia without behavioral disturbance: Secondary | ICD-10-CM | POA: Diagnosis not present

## 2020-05-31 DIAGNOSIS — G309 Alzheimer's disease, unspecified: Secondary | ICD-10-CM | POA: Insufficient documentation

## 2020-05-31 DIAGNOSIS — F0280 Dementia in other diseases classified elsewhere without behavioral disturbance: Secondary | ICD-10-CM | POA: Diagnosis not present

## 2020-05-31 DIAGNOSIS — G9389 Other specified disorders of brain: Secondary | ICD-10-CM | POA: Diagnosis not present

## 2020-05-31 DIAGNOSIS — I6522 Occlusion and stenosis of left carotid artery: Secondary | ICD-10-CM | POA: Diagnosis not present

## 2020-06-06 DIAGNOSIS — U071 COVID-19: Secondary | ICD-10-CM | POA: Diagnosis not present

## 2020-06-07 DIAGNOSIS — E538 Deficiency of other specified B group vitamins: Secondary | ICD-10-CM | POA: Diagnosis not present

## 2020-06-14 DIAGNOSIS — E538 Deficiency of other specified B group vitamins: Secondary | ICD-10-CM | POA: Diagnosis not present

## 2020-06-20 DIAGNOSIS — E538 Deficiency of other specified B group vitamins: Secondary | ICD-10-CM | POA: Diagnosis not present

## 2020-07-02 DIAGNOSIS — E119 Type 2 diabetes mellitus without complications: Secondary | ICD-10-CM | POA: Diagnosis not present

## 2020-07-02 DIAGNOSIS — E538 Deficiency of other specified B group vitamins: Secondary | ICD-10-CM | POA: Diagnosis not present

## 2020-07-05 DIAGNOSIS — E1122 Type 2 diabetes mellitus with diabetic chronic kidney disease: Secondary | ICD-10-CM | POA: Diagnosis not present

## 2020-07-05 DIAGNOSIS — E261 Secondary hyperaldosteronism: Secondary | ICD-10-CM | POA: Diagnosis not present

## 2020-07-05 DIAGNOSIS — I130 Hypertensive heart and chronic kidney disease with heart failure and stage 1 through stage 4 chronic kidney disease, or unspecified chronic kidney disease: Secondary | ICD-10-CM | POA: Diagnosis not present

## 2020-07-05 DIAGNOSIS — G309 Alzheimer's disease, unspecified: Secondary | ICD-10-CM | POA: Diagnosis not present

## 2020-07-05 DIAGNOSIS — I5030 Unspecified diastolic (congestive) heart failure: Secondary | ICD-10-CM | POA: Diagnosis not present

## 2020-07-05 DIAGNOSIS — E1151 Type 2 diabetes mellitus with diabetic peripheral angiopathy without gangrene: Secondary | ICD-10-CM | POA: Diagnosis not present

## 2020-07-05 DIAGNOSIS — E1165 Type 2 diabetes mellitus with hyperglycemia: Secondary | ICD-10-CM | POA: Diagnosis not present

## 2020-07-05 DIAGNOSIS — F0280 Dementia in other diseases classified elsewhere without behavioral disturbance: Secondary | ICD-10-CM | POA: Diagnosis not present

## 2020-07-05 DIAGNOSIS — N183 Chronic kidney disease, stage 3 unspecified: Secondary | ICD-10-CM | POA: Diagnosis not present

## 2020-07-05 DIAGNOSIS — I951 Orthostatic hypotension: Secondary | ICD-10-CM | POA: Diagnosis not present

## 2020-07-05 DIAGNOSIS — I25708 Atherosclerosis of coronary artery bypass graft(s), unspecified, with other forms of angina pectoris: Secondary | ICD-10-CM | POA: Diagnosis not present

## 2020-07-05 DIAGNOSIS — D692 Other nonthrombocytopenic purpura: Secondary | ICD-10-CM | POA: Diagnosis not present

## 2020-07-07 DIAGNOSIS — U071 COVID-19: Secondary | ICD-10-CM | POA: Diagnosis not present

## 2020-07-09 DIAGNOSIS — I10 Essential (primary) hypertension: Secondary | ICD-10-CM | POA: Diagnosis not present

## 2020-07-09 DIAGNOSIS — E7849 Other hyperlipidemia: Secondary | ICD-10-CM | POA: Diagnosis not present

## 2020-07-09 DIAGNOSIS — Z0001 Encounter for general adult medical examination with abnormal findings: Secondary | ICD-10-CM | POA: Diagnosis not present

## 2020-07-09 DIAGNOSIS — I739 Peripheral vascular disease, unspecified: Secondary | ICD-10-CM | POA: Diagnosis not present

## 2020-07-09 DIAGNOSIS — E119 Type 2 diabetes mellitus without complications: Secondary | ICD-10-CM | POA: Diagnosis not present

## 2020-07-09 DIAGNOSIS — I2510 Atherosclerotic heart disease of native coronary artery without angina pectoris: Secondary | ICD-10-CM | POA: Diagnosis not present

## 2020-07-09 DIAGNOSIS — Z0000 Encounter for general adult medical examination without abnormal findings: Secondary | ICD-10-CM | POA: Diagnosis not present

## 2020-08-06 DIAGNOSIS — U071 COVID-19: Secondary | ICD-10-CM | POA: Diagnosis not present

## 2020-08-07 DIAGNOSIS — E1122 Type 2 diabetes mellitus with diabetic chronic kidney disease: Secondary | ICD-10-CM | POA: Diagnosis not present

## 2020-08-07 DIAGNOSIS — N183 Chronic kidney disease, stage 3 unspecified: Secondary | ICD-10-CM | POA: Diagnosis not present

## 2020-08-07 DIAGNOSIS — I130 Hypertensive heart and chronic kidney disease with heart failure and stage 1 through stage 4 chronic kidney disease, or unspecified chronic kidney disease: Secondary | ICD-10-CM | POA: Diagnosis not present

## 2020-08-07 DIAGNOSIS — F0280 Dementia in other diseases classified elsewhere without behavioral disturbance: Secondary | ICD-10-CM | POA: Diagnosis not present

## 2020-08-07 DIAGNOSIS — Z7982 Long term (current) use of aspirin: Secondary | ICD-10-CM | POA: Diagnosis not present

## 2020-08-07 DIAGNOSIS — E1151 Type 2 diabetes mellitus with diabetic peripheral angiopathy without gangrene: Secondary | ICD-10-CM | POA: Diagnosis not present

## 2020-08-07 DIAGNOSIS — I5030 Unspecified diastolic (congestive) heart failure: Secondary | ICD-10-CM | POA: Diagnosis not present

## 2020-08-07 DIAGNOSIS — E261 Secondary hyperaldosteronism: Secondary | ICD-10-CM | POA: Diagnosis not present

## 2020-08-07 DIAGNOSIS — D692 Other nonthrombocytopenic purpura: Secondary | ICD-10-CM | POA: Diagnosis not present

## 2020-08-07 DIAGNOSIS — Z7984 Long term (current) use of oral hypoglycemic drugs: Secondary | ICD-10-CM | POA: Diagnosis not present

## 2020-08-07 DIAGNOSIS — I25708 Atherosclerosis of coronary artery bypass graft(s), unspecified, with other forms of angina pectoris: Secondary | ICD-10-CM | POA: Diagnosis not present

## 2020-08-07 DIAGNOSIS — G309 Alzheimer's disease, unspecified: Secondary | ICD-10-CM | POA: Diagnosis not present

## 2020-09-06 DIAGNOSIS — U071 COVID-19: Secondary | ICD-10-CM | POA: Diagnosis not present

## 2020-10-06 DIAGNOSIS — U071 COVID-19: Secondary | ICD-10-CM | POA: Diagnosis not present

## 2020-10-24 DIAGNOSIS — W19XXXA Unspecified fall, initial encounter: Secondary | ICD-10-CM | POA: Diagnosis not present

## 2020-10-24 DIAGNOSIS — S51011A Laceration without foreign body of right elbow, initial encounter: Secondary | ICD-10-CM | POA: Diagnosis not present

## 2020-10-24 DIAGNOSIS — S41111A Laceration without foreign body of right upper arm, initial encounter: Secondary | ICD-10-CM | POA: Diagnosis not present

## 2020-10-30 DIAGNOSIS — F0280 Dementia in other diseases classified elsewhere without behavioral disturbance: Secondary | ICD-10-CM | POA: Diagnosis not present

## 2020-10-30 DIAGNOSIS — E119 Type 2 diabetes mellitus without complications: Secondary | ICD-10-CM | POA: Diagnosis not present

## 2020-10-30 DIAGNOSIS — I739 Peripheral vascular disease, unspecified: Secondary | ICD-10-CM | POA: Diagnosis not present

## 2020-10-30 DIAGNOSIS — R300 Dysuria: Secondary | ICD-10-CM | POA: Diagnosis not present

## 2020-10-30 DIAGNOSIS — F322 Major depressive disorder, single episode, severe without psychotic features: Secondary | ICD-10-CM | POA: Diagnosis not present

## 2020-10-30 DIAGNOSIS — G309 Alzheimer's disease, unspecified: Secondary | ICD-10-CM | POA: Diagnosis not present

## 2020-10-30 DIAGNOSIS — F0150 Vascular dementia without behavioral disturbance: Secondary | ICD-10-CM | POA: Diagnosis not present

## 2020-10-30 DIAGNOSIS — I10 Essential (primary) hypertension: Secondary | ICD-10-CM | POA: Diagnosis not present

## 2020-10-30 DIAGNOSIS — I2510 Atherosclerotic heart disease of native coronary artery without angina pectoris: Secondary | ICD-10-CM | POA: Diagnosis not present

## 2020-10-31 DIAGNOSIS — I10 Essential (primary) hypertension: Secondary | ICD-10-CM | POA: Diagnosis not present

## 2020-10-31 DIAGNOSIS — I2510 Atherosclerotic heart disease of native coronary artery without angina pectoris: Secondary | ICD-10-CM | POA: Diagnosis not present

## 2020-10-31 DIAGNOSIS — R300 Dysuria: Secondary | ICD-10-CM | POA: Diagnosis not present

## 2020-10-31 DIAGNOSIS — I739 Peripheral vascular disease, unspecified: Secondary | ICD-10-CM | POA: Diagnosis not present

## 2020-10-31 DIAGNOSIS — F0150 Vascular dementia without behavioral disturbance: Secondary | ICD-10-CM | POA: Diagnosis not present

## 2020-10-31 DIAGNOSIS — F322 Major depressive disorder, single episode, severe without psychotic features: Secondary | ICD-10-CM | POA: Diagnosis not present

## 2020-10-31 DIAGNOSIS — G309 Alzheimer's disease, unspecified: Secondary | ICD-10-CM | POA: Diagnosis not present

## 2020-10-31 DIAGNOSIS — F0280 Dementia in other diseases classified elsewhere without behavioral disturbance: Secondary | ICD-10-CM | POA: Diagnosis not present

## 2020-10-31 DIAGNOSIS — E119 Type 2 diabetes mellitus without complications: Secondary | ICD-10-CM | POA: Diagnosis not present

## 2020-11-01 ENCOUNTER — Telehealth: Payer: Self-pay

## 2020-11-01 NOTE — Telephone Encounter (Signed)
Attempted to contact patient's daughter Karen to schedule a Palliative Care consult appointment. No answer left a message to return call.  ° °

## 2020-11-06 ENCOUNTER — Telehealth: Payer: Self-pay | Admitting: Student

## 2020-11-06 DIAGNOSIS — U071 COVID-19: Secondary | ICD-10-CM | POA: Diagnosis not present

## 2020-11-06 NOTE — Telephone Encounter (Signed)
Attempted to contact patient's daughter, Rosine Abe, to offer to schedule an In-home Palliative Consult, no answer - left message with reason for call along with my name and call back number.

## 2020-11-12 DIAGNOSIS — R399 Unspecified symptoms and signs involving the genitourinary system: Secondary | ICD-10-CM | POA: Diagnosis not present

## 2020-11-12 DIAGNOSIS — Z6820 Body mass index (BMI) 20.0-20.9, adult: Secondary | ICD-10-CM | POA: Diagnosis not present

## 2020-11-12 DIAGNOSIS — Z7984 Long term (current) use of oral hypoglycemic drugs: Secondary | ICD-10-CM | POA: Diagnosis not present

## 2020-11-12 DIAGNOSIS — N183 Chronic kidney disease, stage 3 unspecified: Secondary | ICD-10-CM | POA: Diagnosis not present

## 2020-11-12 DIAGNOSIS — I130 Hypertensive heart and chronic kidney disease with heart failure and stage 1 through stage 4 chronic kidney disease, or unspecified chronic kidney disease: Secondary | ICD-10-CM | POA: Diagnosis not present

## 2020-11-12 DIAGNOSIS — E261 Secondary hyperaldosteronism: Secondary | ICD-10-CM | POA: Diagnosis not present

## 2020-11-12 DIAGNOSIS — R059 Cough, unspecified: Secondary | ICD-10-CM | POA: Diagnosis not present

## 2020-11-12 DIAGNOSIS — I5030 Unspecified diastolic (congestive) heart failure: Secondary | ICD-10-CM | POA: Diagnosis not present

## 2020-11-12 DIAGNOSIS — R531 Weakness: Secondary | ICD-10-CM | POA: Diagnosis not present

## 2020-11-12 DIAGNOSIS — E1122 Type 2 diabetes mellitus with diabetic chronic kidney disease: Secondary | ICD-10-CM | POA: Diagnosis not present

## 2020-11-19 DIAGNOSIS — R531 Weakness: Secondary | ICD-10-CM | POA: Diagnosis not present

## 2020-11-21 ENCOUNTER — Telehealth: Payer: Self-pay | Admitting: Student

## 2020-11-21 NOTE — Telephone Encounter (Signed)
Rec'd message that daughter, Rosine Abe, had returned my call.  Returned call to daughter and spoke with her about the Palliative referral/services and after discussing this the daughter she stated that the patient is receiving services similar to ours through [facility] and she wanted to discuss this with her Mom to find out if she wanted to pursue Palliative services with [place] or just continue with [facility].  Daughter took my number and will let me know what they want to do.

## 2020-11-28 ENCOUNTER — Telehealth: Payer: Self-pay | Admitting: Student

## 2020-11-28 NOTE — Telephone Encounter (Signed)
Attempted to contact daughter, Rosine Abe, to f/u with her to see if she talked with her Mom to see if she wanted to schedule a visit with [place], message left requesting a return call.

## 2020-12-04 ENCOUNTER — Telehealth: Payer: Self-pay

## 2020-12-04 NOTE — Telephone Encounter (Signed)
Spoke with daughter, [name] to follow up on scheduling palliative care consult.  [name] reports they are receiving support through [facility] and they will defer palliative services at this time. RN notified MD and palliative referral intake to cancel the referral.

## 2020-12-07 DIAGNOSIS — U071 COVID-19: Secondary | ICD-10-CM | POA: Diagnosis not present

## 2020-12-20 DIAGNOSIS — I5030 Unspecified diastolic (congestive) heart failure: Secondary | ICD-10-CM | POA: Diagnosis not present

## 2020-12-20 DIAGNOSIS — E261 Secondary hyperaldosteronism: Secondary | ICD-10-CM | POA: Diagnosis not present

## 2020-12-20 DIAGNOSIS — Z6821 Body mass index (BMI) 21.0-21.9, adult: Secondary | ICD-10-CM | POA: Diagnosis not present

## 2020-12-20 DIAGNOSIS — G309 Alzheimer's disease, unspecified: Secondary | ICD-10-CM | POA: Diagnosis not present

## 2020-12-20 DIAGNOSIS — Z7984 Long term (current) use of oral hypoglycemic drugs: Secondary | ICD-10-CM | POA: Diagnosis not present

## 2020-12-20 DIAGNOSIS — Z95820 Peripheral vascular angioplasty status with implants and grafts: Secondary | ICD-10-CM | POA: Diagnosis not present

## 2020-12-20 DIAGNOSIS — E1151 Type 2 diabetes mellitus with diabetic peripheral angiopathy without gangrene: Secondary | ICD-10-CM | POA: Diagnosis not present

## 2020-12-20 DIAGNOSIS — N189 Chronic kidney disease, unspecified: Secondary | ICD-10-CM | POA: Diagnosis not present

## 2020-12-20 DIAGNOSIS — I110 Hypertensive heart disease with heart failure: Secondary | ICD-10-CM | POA: Diagnosis not present

## 2020-12-20 DIAGNOSIS — E1122 Type 2 diabetes mellitus with diabetic chronic kidney disease: Secondary | ICD-10-CM | POA: Diagnosis not present

## 2020-12-20 DIAGNOSIS — Z515 Encounter for palliative care: Secondary | ICD-10-CM | POA: Diagnosis not present

## 2020-12-20 DIAGNOSIS — F0280 Dementia in other diseases classified elsewhere without behavioral disturbance: Secondary | ICD-10-CM | POA: Diagnosis not present

## 2021-01-06 DIAGNOSIS — U071 COVID-19: Secondary | ICD-10-CM | POA: Diagnosis not present

## 2021-01-31 DIAGNOSIS — I110 Hypertensive heart disease with heart failure: Secondary | ICD-10-CM | POA: Diagnosis not present

## 2021-01-31 DIAGNOSIS — F0280 Dementia in other diseases classified elsewhere without behavioral disturbance: Secondary | ICD-10-CM | POA: Diagnosis not present

## 2021-01-31 DIAGNOSIS — E261 Secondary hyperaldosteronism: Secondary | ICD-10-CM | POA: Diagnosis not present

## 2021-01-31 DIAGNOSIS — I739 Peripheral vascular disease, unspecified: Secondary | ICD-10-CM | POA: Diagnosis not present

## 2021-01-31 DIAGNOSIS — I25708 Atherosclerosis of coronary artery bypass graft(s), unspecified, with other forms of angina pectoris: Secondary | ICD-10-CM | POA: Diagnosis not present

## 2021-01-31 DIAGNOSIS — I5030 Unspecified diastolic (congestive) heart failure: Secondary | ICD-10-CM | POA: Diagnosis not present

## 2021-01-31 DIAGNOSIS — D692 Other nonthrombocytopenic purpura: Secondary | ICD-10-CM | POA: Diagnosis not present

## 2021-01-31 DIAGNOSIS — Z95820 Peripheral vascular angioplasty status with implants and grafts: Secondary | ICD-10-CM | POA: Diagnosis not present

## 2021-01-31 DIAGNOSIS — Z515 Encounter for palliative care: Secondary | ICD-10-CM | POA: Diagnosis not present

## 2021-01-31 DIAGNOSIS — Z7982 Long term (current) use of aspirin: Secondary | ICD-10-CM | POA: Diagnosis not present

## 2021-01-31 DIAGNOSIS — G309 Alzheimer's disease, unspecified: Secondary | ICD-10-CM | POA: Diagnosis not present

## 2021-01-31 DIAGNOSIS — Z951 Presence of aortocoronary bypass graft: Secondary | ICD-10-CM | POA: Diagnosis not present

## 2021-01-31 DIAGNOSIS — K219 Gastro-esophageal reflux disease without esophagitis: Secondary | ICD-10-CM | POA: Diagnosis not present

## 2021-02-06 DIAGNOSIS — U071 COVID-19: Secondary | ICD-10-CM | POA: Diagnosis not present

## 2021-03-04 DIAGNOSIS — Z7984 Long term (current) use of oral hypoglycemic drugs: Secondary | ICD-10-CM | POA: Diagnosis not present

## 2021-03-04 DIAGNOSIS — F0280 Dementia in other diseases classified elsewhere without behavioral disturbance: Secondary | ICD-10-CM | POA: Diagnosis not present

## 2021-03-04 DIAGNOSIS — G309 Alzheimer's disease, unspecified: Secondary | ICD-10-CM | POA: Diagnosis not present

## 2021-03-04 DIAGNOSIS — D692 Other nonthrombocytopenic purpura: Secondary | ICD-10-CM | POA: Diagnosis not present

## 2021-03-04 DIAGNOSIS — I5030 Unspecified diastolic (congestive) heart failure: Secondary | ICD-10-CM | POA: Diagnosis not present

## 2021-03-04 DIAGNOSIS — E261 Secondary hyperaldosteronism: Secondary | ICD-10-CM | POA: Diagnosis not present

## 2021-03-04 DIAGNOSIS — E1122 Type 2 diabetes mellitus with diabetic chronic kidney disease: Secondary | ICD-10-CM | POA: Diagnosis not present

## 2021-03-04 DIAGNOSIS — N183 Chronic kidney disease, stage 3 unspecified: Secondary | ICD-10-CM | POA: Diagnosis not present

## 2021-03-04 DIAGNOSIS — I110 Hypertensive heart disease with heart failure: Secondary | ICD-10-CM | POA: Diagnosis not present

## 2021-03-08 DIAGNOSIS — U071 COVID-19: Secondary | ICD-10-CM | POA: Diagnosis not present

## 2021-04-08 DIAGNOSIS — U071 COVID-19: Secondary | ICD-10-CM | POA: Diagnosis not present

## 2021-05-06 DIAGNOSIS — D692 Other nonthrombocytopenic purpura: Secondary | ICD-10-CM | POA: Diagnosis not present

## 2021-05-06 DIAGNOSIS — I25708 Atherosclerosis of coronary artery bypass graft(s), unspecified, with other forms of angina pectoris: Secondary | ICD-10-CM | POA: Diagnosis not present

## 2021-05-06 DIAGNOSIS — F330 Major depressive disorder, recurrent, mild: Secondary | ICD-10-CM | POA: Diagnosis not present

## 2021-05-06 DIAGNOSIS — E1122 Type 2 diabetes mellitus with diabetic chronic kidney disease: Secondary | ICD-10-CM | POA: Diagnosis not present

## 2021-05-06 DIAGNOSIS — Z515 Encounter for palliative care: Secondary | ICD-10-CM | POA: Diagnosis not present

## 2021-05-06 DIAGNOSIS — I5030 Unspecified diastolic (congestive) heart failure: Secondary | ICD-10-CM | POA: Diagnosis not present

## 2021-05-06 DIAGNOSIS — N183 Chronic kidney disease, stage 3 unspecified: Secondary | ICD-10-CM | POA: Diagnosis not present

## 2021-05-06 DIAGNOSIS — F0280 Dementia in other diseases classified elsewhere without behavioral disturbance: Secondary | ICD-10-CM | POA: Diagnosis not present

## 2021-05-06 DIAGNOSIS — I110 Hypertensive heart disease with heart failure: Secondary | ICD-10-CM | POA: Diagnosis not present

## 2021-05-06 DIAGNOSIS — G309 Alzheimer's disease, unspecified: Secondary | ICD-10-CM | POA: Diagnosis not present

## 2021-05-06 DIAGNOSIS — E1151 Type 2 diabetes mellitus with diabetic peripheral angiopathy without gangrene: Secondary | ICD-10-CM | POA: Diagnosis not present

## 2021-05-06 DIAGNOSIS — R001 Bradycardia, unspecified: Secondary | ICD-10-CM | POA: Diagnosis not present

## 2021-05-08 DIAGNOSIS — I10 Essential (primary) hypertension: Secondary | ICD-10-CM | POA: Diagnosis not present

## 2021-05-08 DIAGNOSIS — R001 Bradycardia, unspecified: Secondary | ICD-10-CM | POA: Diagnosis not present

## 2021-05-08 DIAGNOSIS — E7849 Other hyperlipidemia: Secondary | ICD-10-CM | POA: Diagnosis not present

## 2021-05-08 DIAGNOSIS — I2510 Atherosclerotic heart disease of native coronary artery without angina pectoris: Secondary | ICD-10-CM | POA: Diagnosis not present

## 2021-05-13 DIAGNOSIS — R051 Acute cough: Secondary | ICD-10-CM | POA: Diagnosis not present

## 2021-05-16 DIAGNOSIS — Z09 Encounter for follow-up examination after completed treatment for conditions other than malignant neoplasm: Secondary | ICD-10-CM | POA: Diagnosis not present

## 2021-05-16 DIAGNOSIS — Z8709 Personal history of other diseases of the respiratory system: Secondary | ICD-10-CM | POA: Diagnosis not present

## 2021-05-27 DIAGNOSIS — E860 Dehydration: Secondary | ICD-10-CM | POA: Diagnosis not present

## 2021-05-27 DIAGNOSIS — J0100 Acute maxillary sinusitis, unspecified: Secondary | ICD-10-CM | POA: Diagnosis not present

## 2021-05-27 DIAGNOSIS — I951 Orthostatic hypotension: Secondary | ICD-10-CM | POA: Diagnosis not present

## 2021-05-27 DIAGNOSIS — R630 Anorexia: Secondary | ICD-10-CM | POA: Diagnosis not present

## 2021-05-29 DIAGNOSIS — J0100 Acute maxillary sinusitis, unspecified: Secondary | ICD-10-CM | POA: Diagnosis not present

## 2021-06-04 DIAGNOSIS — J329 Chronic sinusitis, unspecified: Secondary | ICD-10-CM | POA: Diagnosis not present

## 2021-06-27 DIAGNOSIS — G309 Alzheimer's disease, unspecified: Secondary | ICD-10-CM | POA: Diagnosis not present

## 2021-06-27 DIAGNOSIS — F0280 Dementia in other diseases classified elsewhere without behavioral disturbance: Secondary | ICD-10-CM | POA: Diagnosis not present

## 2021-06-27 DIAGNOSIS — I110 Hypertensive heart disease with heart failure: Secondary | ICD-10-CM | POA: Diagnosis not present

## 2021-06-27 DIAGNOSIS — N183 Chronic kidney disease, stage 3 unspecified: Secondary | ICD-10-CM | POA: Diagnosis not present

## 2021-06-27 DIAGNOSIS — Z7984 Long term (current) use of oral hypoglycemic drugs: Secondary | ICD-10-CM | POA: Diagnosis not present

## 2021-06-27 DIAGNOSIS — E1122 Type 2 diabetes mellitus with diabetic chronic kidney disease: Secondary | ICD-10-CM | POA: Diagnosis not present

## 2021-06-27 DIAGNOSIS — F3342 Major depressive disorder, recurrent, in full remission: Secondary | ICD-10-CM | POA: Diagnosis not present

## 2021-06-27 DIAGNOSIS — D692 Other nonthrombocytopenic purpura: Secondary | ICD-10-CM | POA: Diagnosis not present

## 2021-06-27 DIAGNOSIS — E261 Secondary hyperaldosteronism: Secondary | ICD-10-CM | POA: Diagnosis not present

## 2021-06-27 DIAGNOSIS — Z515 Encounter for palliative care: Secondary | ICD-10-CM | POA: Diagnosis not present

## 2021-06-27 DIAGNOSIS — Z87891 Personal history of nicotine dependence: Secondary | ICD-10-CM | POA: Diagnosis not present

## 2021-06-27 DIAGNOSIS — I5030 Unspecified diastolic (congestive) heart failure: Secondary | ICD-10-CM | POA: Diagnosis not present

## 2021-07-11 DIAGNOSIS — I739 Peripheral vascular disease, unspecified: Secondary | ICD-10-CM | POA: Diagnosis not present

## 2021-07-11 DIAGNOSIS — I2510 Atherosclerotic heart disease of native coronary artery without angina pectoris: Secondary | ICD-10-CM | POA: Diagnosis not present

## 2021-07-11 DIAGNOSIS — G309 Alzheimer's disease, unspecified: Secondary | ICD-10-CM | POA: Diagnosis not present

## 2021-07-11 DIAGNOSIS — F0150 Vascular dementia without behavioral disturbance: Secondary | ICD-10-CM | POA: Diagnosis not present

## 2021-07-11 DIAGNOSIS — F0280 Dementia in other diseases classified elsewhere without behavioral disturbance: Secondary | ICD-10-CM | POA: Diagnosis not present

## 2021-07-11 DIAGNOSIS — I10 Essential (primary) hypertension: Secondary | ICD-10-CM | POA: Diagnosis not present

## 2021-07-11 DIAGNOSIS — Z0000 Encounter for general adult medical examination without abnormal findings: Secondary | ICD-10-CM | POA: Diagnosis not present

## 2021-07-11 DIAGNOSIS — E7849 Other hyperlipidemia: Secondary | ICD-10-CM | POA: Diagnosis not present

## 2021-07-11 DIAGNOSIS — E119 Type 2 diabetes mellitus without complications: Secondary | ICD-10-CM | POA: Diagnosis not present

## 2021-08-29 DIAGNOSIS — E860 Dehydration: Secondary | ICD-10-CM | POA: Diagnosis not present

## 2021-08-29 DIAGNOSIS — F0280 Dementia in other diseases classified elsewhere without behavioral disturbance: Secondary | ICD-10-CM | POA: Diagnosis not present

## 2021-08-29 DIAGNOSIS — E1151 Type 2 diabetes mellitus with diabetic peripheral angiopathy without gangrene: Secondary | ICD-10-CM | POA: Diagnosis not present

## 2021-08-29 DIAGNOSIS — E1122 Type 2 diabetes mellitus with diabetic chronic kidney disease: Secondary | ICD-10-CM | POA: Diagnosis not present

## 2021-08-29 DIAGNOSIS — N183 Chronic kidney disease, stage 3 unspecified: Secondary | ICD-10-CM | POA: Diagnosis not present

## 2021-08-29 DIAGNOSIS — I25118 Atherosclerotic heart disease of native coronary artery with other forms of angina pectoris: Secondary | ICD-10-CM | POA: Diagnosis not present

## 2021-08-29 DIAGNOSIS — E261 Secondary hyperaldosteronism: Secondary | ICD-10-CM | POA: Diagnosis not present

## 2021-08-29 DIAGNOSIS — G309 Alzheimer's disease, unspecified: Secondary | ICD-10-CM | POA: Diagnosis not present

## 2021-08-29 DIAGNOSIS — D692 Other nonthrombocytopenic purpura: Secondary | ICD-10-CM | POA: Diagnosis not present

## 2021-08-29 DIAGNOSIS — I5030 Unspecified diastolic (congestive) heart failure: Secondary | ICD-10-CM | POA: Diagnosis not present

## 2021-08-29 DIAGNOSIS — I110 Hypertensive heart disease with heart failure: Secondary | ICD-10-CM | POA: Diagnosis not present

## 2021-08-29 DIAGNOSIS — Z515 Encounter for palliative care: Secondary | ICD-10-CM | POA: Diagnosis not present

## 2021-11-21 DIAGNOSIS — Z6821 Body mass index (BMI) 21.0-21.9, adult: Secondary | ICD-10-CM | POA: Diagnosis not present

## 2021-11-21 DIAGNOSIS — I5030 Unspecified diastolic (congestive) heart failure: Secondary | ICD-10-CM | POA: Diagnosis not present

## 2021-11-21 DIAGNOSIS — N183 Chronic kidney disease, stage 3 unspecified: Secondary | ICD-10-CM | POA: Diagnosis not present

## 2021-11-21 DIAGNOSIS — I110 Hypertensive heart disease with heart failure: Secondary | ICD-10-CM | POA: Diagnosis not present

## 2021-11-21 DIAGNOSIS — D692 Other nonthrombocytopenic purpura: Secondary | ICD-10-CM | POA: Diagnosis not present

## 2021-11-21 DIAGNOSIS — E1122 Type 2 diabetes mellitus with diabetic chronic kidney disease: Secondary | ICD-10-CM | POA: Diagnosis not present

## 2021-11-21 DIAGNOSIS — Z7982 Long term (current) use of aspirin: Secondary | ICD-10-CM | POA: Diagnosis not present

## 2021-11-21 DIAGNOSIS — G309 Alzheimer's disease, unspecified: Secondary | ICD-10-CM | POA: Diagnosis not present

## 2021-11-21 DIAGNOSIS — E261 Secondary hyperaldosteronism: Secondary | ICD-10-CM | POA: Diagnosis not present

## 2021-11-21 DIAGNOSIS — F0280 Dementia in other diseases classified elsewhere without behavioral disturbance: Secondary | ICD-10-CM | POA: Diagnosis not present

## 2021-11-21 DIAGNOSIS — Z515 Encounter for palliative care: Secondary | ICD-10-CM | POA: Diagnosis not present

## 2021-11-21 DIAGNOSIS — I25118 Atherosclerotic heart disease of native coronary artery with other forms of angina pectoris: Secondary | ICD-10-CM | POA: Diagnosis not present

## 2022-01-09 DIAGNOSIS — E119 Type 2 diabetes mellitus without complications: Secondary | ICD-10-CM | POA: Diagnosis not present

## 2022-01-14 DIAGNOSIS — F322 Major depressive disorder, single episode, severe without psychotic features: Secondary | ICD-10-CM | POA: Diagnosis not present

## 2022-01-14 DIAGNOSIS — E7849 Other hyperlipidemia: Secondary | ICD-10-CM | POA: Diagnosis not present

## 2022-01-14 DIAGNOSIS — I2510 Atherosclerotic heart disease of native coronary artery without angina pectoris: Secondary | ICD-10-CM | POA: Diagnosis not present

## 2022-01-14 DIAGNOSIS — Z23 Encounter for immunization: Secondary | ICD-10-CM | POA: Diagnosis not present

## 2022-01-14 DIAGNOSIS — E1159 Type 2 diabetes mellitus with other circulatory complications: Secondary | ICD-10-CM | POA: Diagnosis not present

## 2022-01-14 DIAGNOSIS — F0150 Vascular dementia without behavioral disturbance: Secondary | ICD-10-CM | POA: Diagnosis not present

## 2022-01-14 DIAGNOSIS — I739 Peripheral vascular disease, unspecified: Secondary | ICD-10-CM | POA: Diagnosis not present

## 2022-01-14 DIAGNOSIS — E119 Type 2 diabetes mellitus without complications: Secondary | ICD-10-CM | POA: Diagnosis not present

## 2022-01-14 DIAGNOSIS — I10 Essential (primary) hypertension: Secondary | ICD-10-CM | POA: Diagnosis not present

## 2022-01-14 DIAGNOSIS — Z0000 Encounter for general adult medical examination without abnormal findings: Secondary | ICD-10-CM | POA: Diagnosis not present

## 2022-01-14 DIAGNOSIS — G309 Alzheimer's disease, unspecified: Secondary | ICD-10-CM | POA: Diagnosis not present

## 2022-01-14 DIAGNOSIS — Z0001 Encounter for general adult medical examination with abnormal findings: Secondary | ICD-10-CM | POA: Diagnosis not present

## 2022-04-04 DIAGNOSIS — B351 Tinea unguium: Secondary | ICD-10-CM | POA: Diagnosis not present

## 2022-04-04 DIAGNOSIS — M79675 Pain in left toe(s): Secondary | ICD-10-CM | POA: Diagnosis not present

## 2022-04-04 DIAGNOSIS — E119 Type 2 diabetes mellitus without complications: Secondary | ICD-10-CM | POA: Diagnosis not present

## 2022-04-04 DIAGNOSIS — L600 Ingrowing nail: Secondary | ICD-10-CM | POA: Diagnosis not present

## 2022-04-04 DIAGNOSIS — M79674 Pain in right toe(s): Secondary | ICD-10-CM | POA: Diagnosis not present

## 2022-09-02 DIAGNOSIS — E7849 Other hyperlipidemia: Secondary | ICD-10-CM | POA: Diagnosis not present

## 2022-09-02 DIAGNOSIS — F322 Major depressive disorder, single episode, severe without psychotic features: Secondary | ICD-10-CM | POA: Diagnosis not present

## 2022-09-02 DIAGNOSIS — I739 Peripheral vascular disease, unspecified: Secondary | ICD-10-CM | POA: Diagnosis not present

## 2022-09-02 DIAGNOSIS — G309 Alzheimer's disease, unspecified: Secondary | ICD-10-CM | POA: Diagnosis not present

## 2022-09-02 DIAGNOSIS — F0280 Dementia in other diseases classified elsewhere without behavioral disturbance: Secondary | ICD-10-CM | POA: Diagnosis not present

## 2022-09-02 DIAGNOSIS — F0150 Vascular dementia without behavioral disturbance: Secondary | ICD-10-CM | POA: Diagnosis not present

## 2022-09-02 DIAGNOSIS — E119 Type 2 diabetes mellitus without complications: Secondary | ICD-10-CM | POA: Diagnosis not present

## 2022-09-02 DIAGNOSIS — I2510 Atherosclerotic heart disease of native coronary artery without angina pectoris: Secondary | ICD-10-CM | POA: Diagnosis not present

## 2022-09-02 DIAGNOSIS — I10 Essential (primary) hypertension: Secondary | ICD-10-CM | POA: Diagnosis not present

## 2022-09-11 DIAGNOSIS — I739 Peripheral vascular disease, unspecified: Secondary | ICD-10-CM | POA: Diagnosis not present

## 2022-10-13 DIAGNOSIS — I255 Ischemic cardiomyopathy: Secondary | ICD-10-CM | POA: Diagnosis not present

## 2022-11-06 DIAGNOSIS — F0150 Vascular dementia without behavioral disturbance: Secondary | ICD-10-CM | POA: Diagnosis not present

## 2022-11-06 DIAGNOSIS — G309 Alzheimer's disease, unspecified: Secondary | ICD-10-CM | POA: Diagnosis not present

## 2022-11-06 DIAGNOSIS — F0280 Dementia in other diseases classified elsewhere without behavioral disturbance: Secondary | ICD-10-CM | POA: Diagnosis not present

## 2022-11-30 DIAGNOSIS — D692 Other nonthrombocytopenic purpura: Secondary | ICD-10-CM | POA: Diagnosis not present

## 2022-11-30 DIAGNOSIS — I110 Hypertensive heart disease with heart failure: Secondary | ICD-10-CM | POA: Diagnosis not present

## 2022-11-30 DIAGNOSIS — Z515 Encounter for palliative care: Secondary | ICD-10-CM | POA: Diagnosis not present

## 2022-11-30 DIAGNOSIS — G309 Alzheimer's disease, unspecified: Secondary | ICD-10-CM | POA: Diagnosis not present

## 2022-11-30 DIAGNOSIS — Z7984 Long term (current) use of oral hypoglycemic drugs: Secondary | ICD-10-CM | POA: Diagnosis not present

## 2022-11-30 DIAGNOSIS — E261 Secondary hyperaldosteronism: Secondary | ICD-10-CM | POA: Diagnosis not present

## 2022-11-30 DIAGNOSIS — Z87891 Personal history of nicotine dependence: Secondary | ICD-10-CM | POA: Diagnosis not present

## 2022-11-30 DIAGNOSIS — F0280 Dementia in other diseases classified elsewhere without behavioral disturbance: Secondary | ICD-10-CM | POA: Diagnosis not present

## 2022-11-30 DIAGNOSIS — I5030 Unspecified diastolic (congestive) heart failure: Secondary | ICD-10-CM | POA: Diagnosis not present

## 2022-11-30 DIAGNOSIS — N189 Chronic kidney disease, unspecified: Secondary | ICD-10-CM | POA: Diagnosis not present

## 2022-11-30 DIAGNOSIS — E1122 Type 2 diabetes mellitus with diabetic chronic kidney disease: Secondary | ICD-10-CM | POA: Diagnosis not present

## 2023-01-15 IMAGING — MR MR HEAD W/O CM
12 series · 48 of 48 positions shown · non-contrast
Comparison: None.

CLINICAL DATA: Alzheimer's and vascular dementia.  Baseline scan.

EXAM:
MRI HEAD WITHOUT CONTRAST
TECHNIQUE: Multiplanar, multiecho pulse sequences of the brain and surrounding
structures were obtained without intravenous contrast.

[Series 5: ax dwi_tracew · axial · 3.0mm · 0.65mm/px · z∈[-81,+74]mm · 4 of 48 slices shown]
[im 1/48]
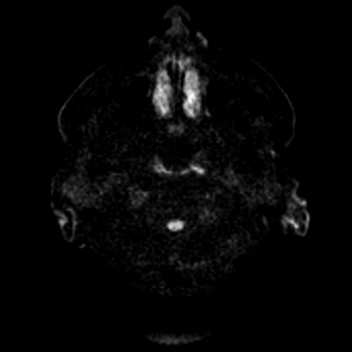
[im 16/48]
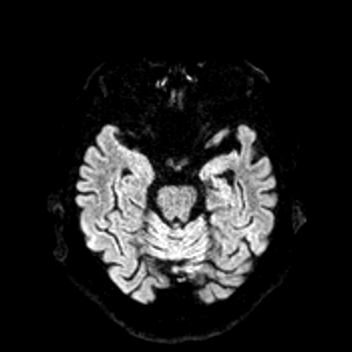
[im 32/48]
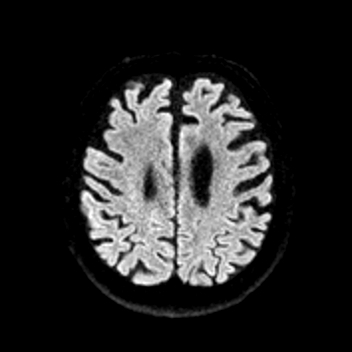
[im 48/48]
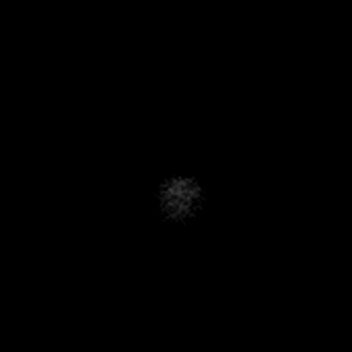

[Series 6: ax dwi_adc · axial · 3.0mm · 0.65mm/px · z∈[-81,+74]mm · 3 of 48 slices shown]
[im 1/48]
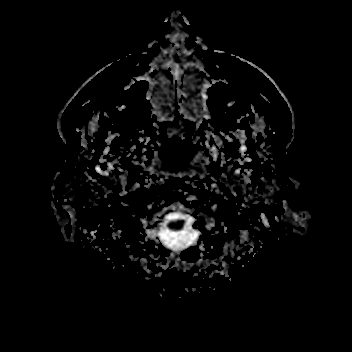
[im 24/48]
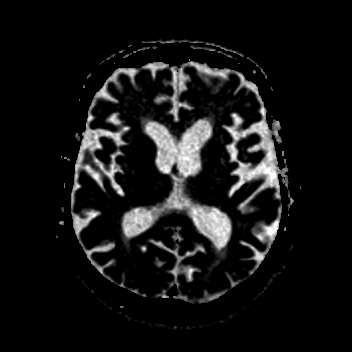
[im 48/48]
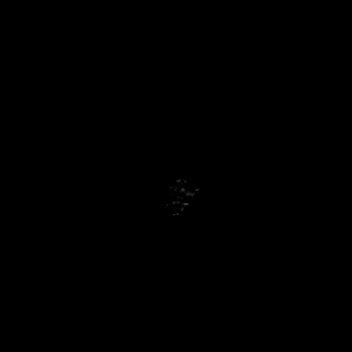

[Series 7: cor dwi_tracew · coronal · 5.0mm · 0.65mm/px · 3 of 40 slices shown]
[im 1/40]
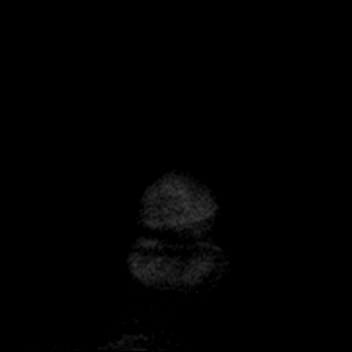
[im 20/40]
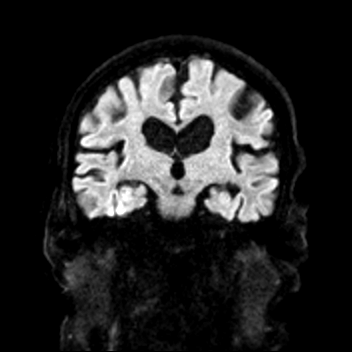
[im 40/40]
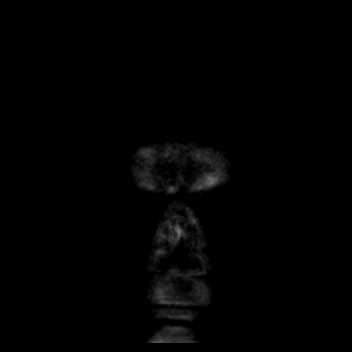

[Series 8: cor dwi_adc · coronal · 5.0mm · 0.65mm/px · 3 of 40 slices shown]
[im 1/40]
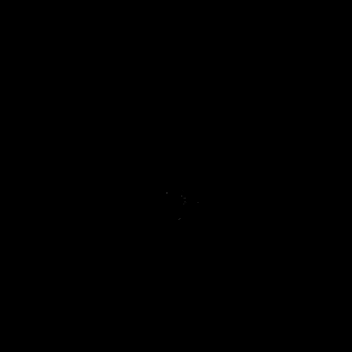
[im 20/40]
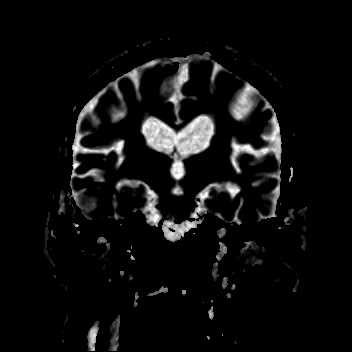
[im 40/40]
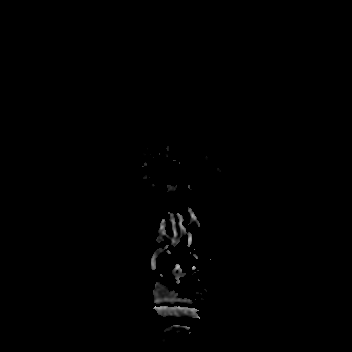

[Series 9: T1 · sagittal · 5.0mm · 0.62mm/px · 2 of 25 slices shown (1 of 2)]
[im 1/25]
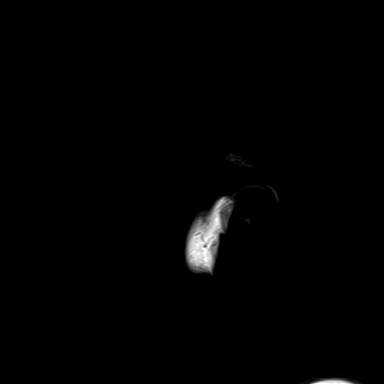
[im 25/25]
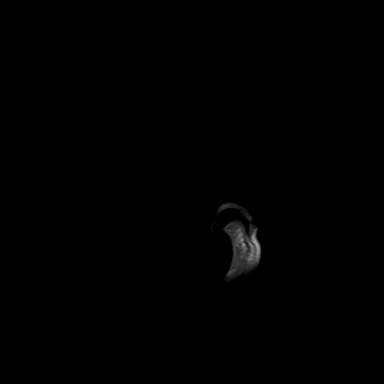

[Series 10: T2 · axial · 5.0mm · 0.53mm/px · z∈[-82,+74]mm · 2 of 27 slices shown (1 of 2)]
[im 1/27]
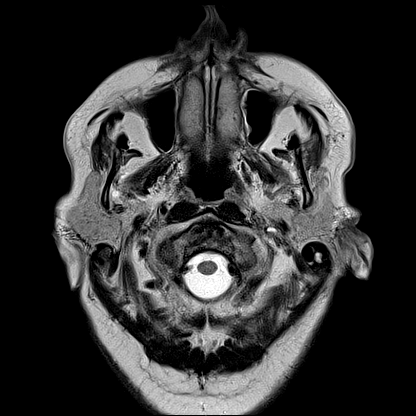
[im 27/27]
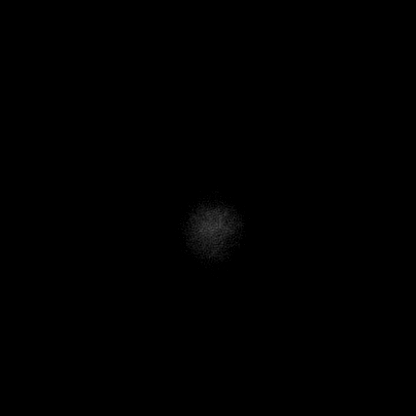

[Series 11: mag_images · axial · 3.0mm · 0.90mm/px · z∈[-92,+85]mm · 4 of 60 slices shown]
[im 1/60]
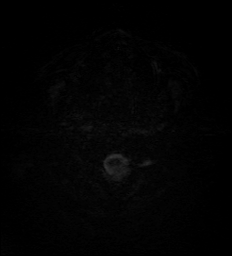
[im 20/60]
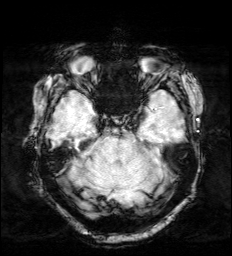
[im 40/60]
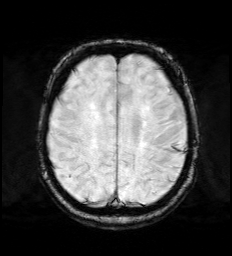
[im 60/60]
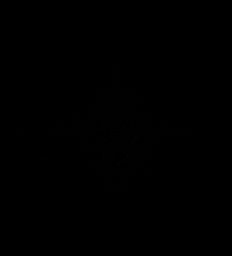

[Series 12: pha_images · axial · 3.0mm · 0.90mm/px · z∈[-92,+85]mm · 4 of 59 slices shown]
[im 1/59]
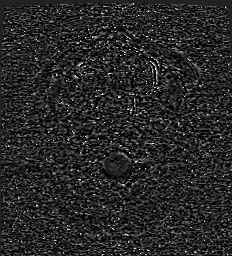
[im 20/59]
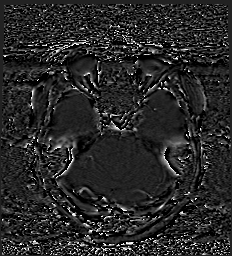
[im 39/59]
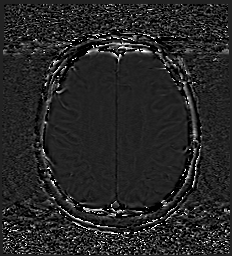
[im 59/59]
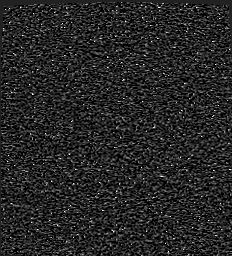

[Series 13: swi_images · axial · 3.0mm · 0.90mm/px · z∈[-92,+85]mm · 4 of 60 slices shown]
[im 1/60]
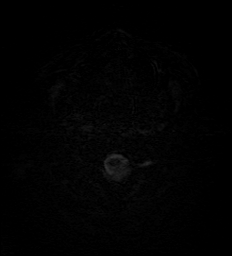
[im 20/60]
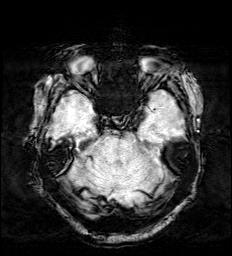
[im 40/60]
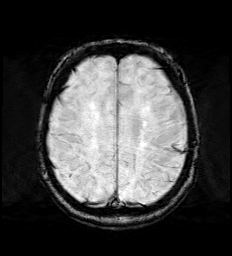
[im 60/60]
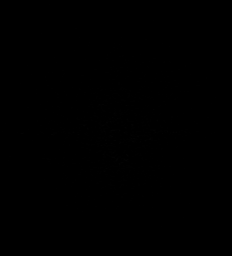

[Series 15: FLAIR · axial · 3.0mm · 0.53mm/px · z∈[-85,+77]mm · 4 of 55 slices shown]
[im 1/55]
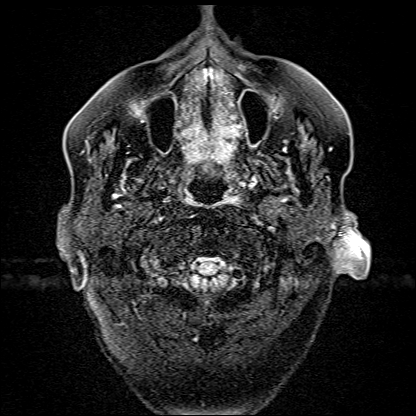
[im 19/55]
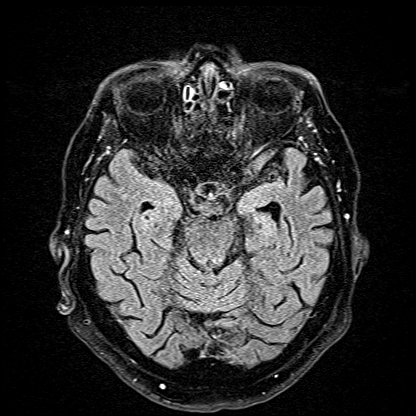
[im 37/55]
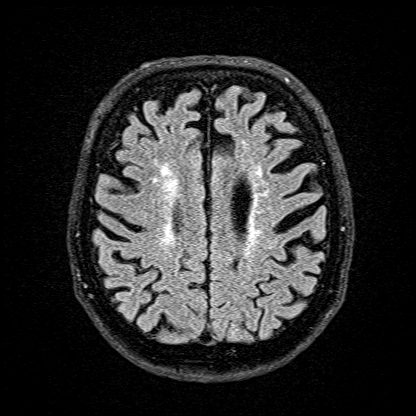
[im 55/55]
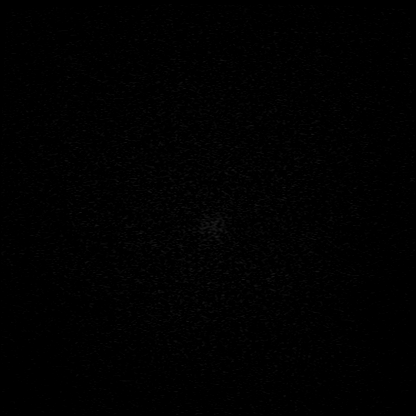

[Series 16: T1 · axial · 1.0mm · 0.98mm/px · z∈[-90,+84]mm · 13 of 176 slices shown (2 of 2)]
[im 1/176]
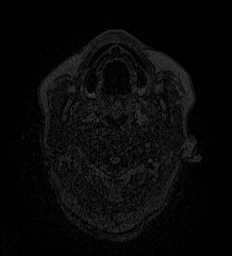
[im 15/176]
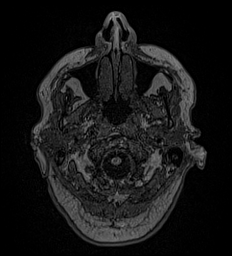
[im 30/176]
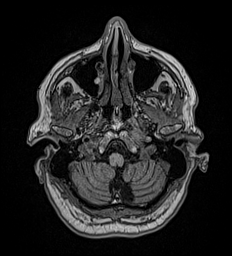
[im 44/176]
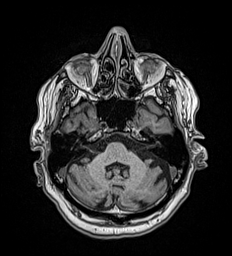
[im 59/176]
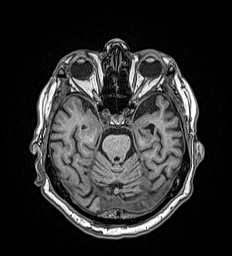
[im 73/176]
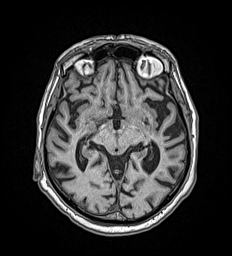
[im 88/176]
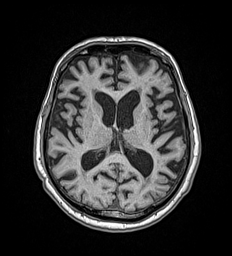
[im 103/176]
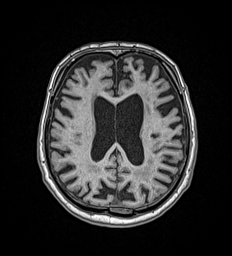
[im 117/176]
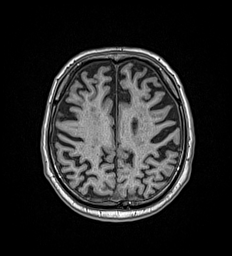
[im 132/176]
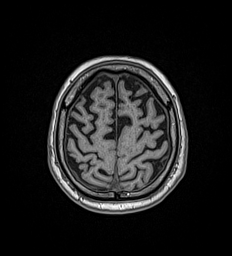
[im 146/176]
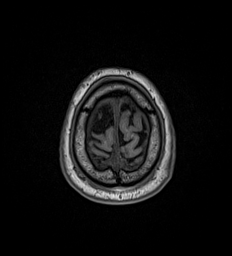
[im 161/176]
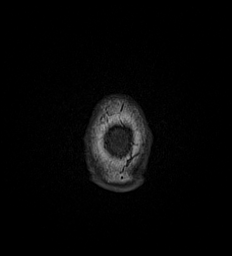
[im 176/176]
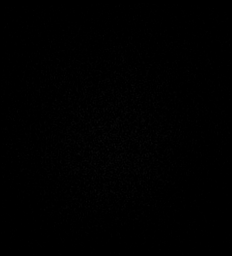

[Series 17: T2 · coronal · 5.0mm · 0.57mm/px · 2 of 29 slices shown (2 of 2)]
[im 1/29]
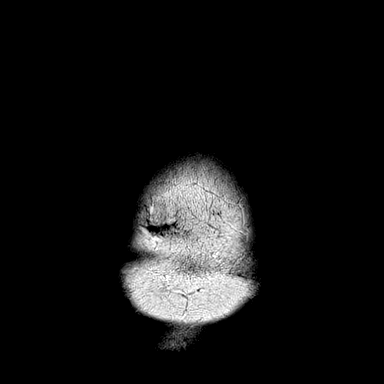
[im 29/29]
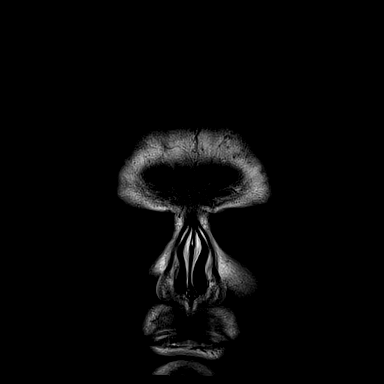

[48 of 48 positions shown; findings below may reference images not displayed]

FINDINGS: Brain: Age accelerated brain atrophy which is generalized.

Chronic small vessel ischemic gliosis in the cerebral white matter
which is mild to moderate for age and mainly mainly periventricular.
No noted chronic cortical infarcts.

No mass, collection, hydrocephalus, or chronic blood products.

Vascular: Absent flow void in the left ICA with supraclinoid
reconstitution. In the neck the left ICA appears collapsed.

Skull and upper cervical spine: Normal marrow signal

Sinuses/Orbits: Minimal bilateral mastoid opacification with
negative nasopharynx.
IMPRESSION: 1. No reversible finding or recent insult.
2. Age advanced brain atrophy which is generalized.
3. Mild or moderate chronic small vessel ischemia in the white
matter.
4. Chronic left ICA occlusion with supraclinoid reconstitution.

## 2023-02-01 DIAGNOSIS — Z23 Encounter for immunization: Secondary | ICD-10-CM | POA: Diagnosis not present

## 2023-02-19 DIAGNOSIS — E119 Type 2 diabetes mellitus without complications: Secondary | ICD-10-CM | POA: Diagnosis not present

## 2023-02-23 DIAGNOSIS — F0150 Vascular dementia without behavioral disturbance: Secondary | ICD-10-CM | POA: Diagnosis not present

## 2023-02-23 DIAGNOSIS — F0280 Dementia in other diseases classified elsewhere without behavioral disturbance: Secondary | ICD-10-CM | POA: Diagnosis not present

## 2023-02-23 DIAGNOSIS — E119 Type 2 diabetes mellitus without complications: Secondary | ICD-10-CM | POA: Diagnosis not present

## 2023-02-23 DIAGNOSIS — I2510 Atherosclerotic heart disease of native coronary artery without angina pectoris: Secondary | ICD-10-CM | POA: Diagnosis not present

## 2023-02-23 DIAGNOSIS — I10 Essential (primary) hypertension: Secondary | ICD-10-CM | POA: Diagnosis not present

## 2023-02-23 DIAGNOSIS — F322 Major depressive disorder, single episode, severe without psychotic features: Secondary | ICD-10-CM | POA: Diagnosis not present

## 2023-02-23 DIAGNOSIS — G309 Alzheimer's disease, unspecified: Secondary | ICD-10-CM | POA: Diagnosis not present

## 2023-02-23 DIAGNOSIS — E7849 Other hyperlipidemia: Secondary | ICD-10-CM | POA: Diagnosis not present

## 2023-02-23 DIAGNOSIS — D649 Anemia, unspecified: Secondary | ICD-10-CM | POA: Diagnosis not present

## 2023-02-23 DIAGNOSIS — I739 Peripheral vascular disease, unspecified: Secondary | ICD-10-CM | POA: Diagnosis not present

## 2023-02-23 DIAGNOSIS — Z0001 Encounter for general adult medical examination with abnormal findings: Secondary | ICD-10-CM | POA: Diagnosis not present

## 2023-02-25 ENCOUNTER — Inpatient Hospital Stay
Admission: EM | Admit: 2023-02-25 | Discharge: 2023-03-05 | DRG: 280 | Disposition: A | Discharge status: Hospice | Payer: No Typology Code available for payment source | Attending: Internal Medicine | Admitting: Internal Medicine

## 2023-02-25 ENCOUNTER — Emergency Department: Payer: No Typology Code available for payment source

## 2023-02-25 ENCOUNTER — Other Ambulatory Visit: Payer: Self-pay

## 2023-02-25 DIAGNOSIS — I452 Bifascicular block: Secondary | ICD-10-CM | POA: Diagnosis present

## 2023-02-25 DIAGNOSIS — N2889 Other specified disorders of kidney and ureter: Secondary | ICD-10-CM | POA: Diagnosis not present

## 2023-02-25 DIAGNOSIS — I21A1 Myocardial infarction type 2: Secondary | ICD-10-CM | POA: Diagnosis present

## 2023-02-25 DIAGNOSIS — Z66 Do not resuscitate: Secondary | ICD-10-CM | POA: Diagnosis not present

## 2023-02-25 DIAGNOSIS — N281 Cyst of kidney, acquired: Secondary | ICD-10-CM | POA: Diagnosis present

## 2023-02-25 DIAGNOSIS — I252 Old myocardial infarction: Secondary | ICD-10-CM

## 2023-02-25 DIAGNOSIS — F0392 Unspecified dementia, unspecified severity, with psychotic disturbance: Secondary | ICD-10-CM | POA: Diagnosis not present

## 2023-02-25 DIAGNOSIS — I161 Hypertensive emergency: Principal | ICD-10-CM | POA: Diagnosis present

## 2023-02-25 DIAGNOSIS — F03911 Unspecified dementia, unspecified severity, with agitation: Secondary | ICD-10-CM | POA: Diagnosis present

## 2023-02-25 DIAGNOSIS — J9601 Acute respiratory failure with hypoxia: Secondary | ICD-10-CM | POA: Diagnosis present

## 2023-02-25 DIAGNOSIS — I5A Non-ischemic myocardial injury (non-traumatic): Secondary | ICD-10-CM | POA: Diagnosis not present

## 2023-02-25 DIAGNOSIS — N2881 Hypertrophy of kidney: Secondary | ICD-10-CM | POA: Diagnosis not present

## 2023-02-25 DIAGNOSIS — I2119 ST elevation (STEMI) myocardial infarction involving other coronary artery of inferior wall: Secondary | ICD-10-CM | POA: Diagnosis not present

## 2023-02-25 DIAGNOSIS — Z8249 Family history of ischemic heart disease and other diseases of the circulatory system: Secondary | ICD-10-CM

## 2023-02-25 DIAGNOSIS — I25119 Atherosclerotic heart disease of native coronary artery with unspecified angina pectoris: Secondary | ICD-10-CM | POA: Diagnosis not present

## 2023-02-25 DIAGNOSIS — J440 Chronic obstructive pulmonary disease with acute lower respiratory infection: Secondary | ICD-10-CM | POA: Diagnosis present

## 2023-02-25 DIAGNOSIS — D72829 Elevated white blood cell count, unspecified: Secondary | ICD-10-CM | POA: Diagnosis not present

## 2023-02-25 DIAGNOSIS — R590 Localized enlarged lymph nodes: Secondary | ICD-10-CM | POA: Diagnosis not present

## 2023-02-25 DIAGNOSIS — E871 Hypo-osmolality and hyponatremia: Secondary | ICD-10-CM | POA: Diagnosis not present

## 2023-02-25 DIAGNOSIS — J9621 Acute and chronic respiratory failure with hypoxia: Secondary | ICD-10-CM | POA: Diagnosis present

## 2023-02-25 DIAGNOSIS — J90 Pleural effusion, not elsewhere classified: Secondary | ICD-10-CM | POA: Diagnosis not present

## 2023-02-25 DIAGNOSIS — I5023 Acute on chronic systolic (congestive) heart failure: Secondary | ICD-10-CM | POA: Diagnosis not present

## 2023-02-25 DIAGNOSIS — J9600 Acute respiratory failure, unspecified whether with hypoxia or hypercapnia: Secondary | ICD-10-CM | POA: Diagnosis not present

## 2023-02-25 DIAGNOSIS — J189 Pneumonia, unspecified organism: Secondary | ICD-10-CM | POA: Diagnosis present

## 2023-02-25 DIAGNOSIS — C649 Malignant neoplasm of unspecified kidney, except renal pelvis: Secondary | ICD-10-CM | POA: Diagnosis present

## 2023-02-25 DIAGNOSIS — R110 Nausea: Secondary | ICD-10-CM | POA: Diagnosis not present

## 2023-02-25 DIAGNOSIS — E785 Hyperlipidemia, unspecified: Secondary | ICD-10-CM | POA: Diagnosis present

## 2023-02-25 DIAGNOSIS — I25118 Atherosclerotic heart disease of native coronary artery with other forms of angina pectoris: Secondary | ICD-10-CM | POA: Diagnosis not present

## 2023-02-25 DIAGNOSIS — J9811 Atelectasis: Secondary | ICD-10-CM | POA: Diagnosis present

## 2023-02-25 DIAGNOSIS — N179 Acute kidney failure, unspecified: Secondary | ICD-10-CM | POA: Diagnosis not present

## 2023-02-25 DIAGNOSIS — Z87891 Personal history of nicotine dependence: Secondary | ICD-10-CM

## 2023-02-25 DIAGNOSIS — I2510 Atherosclerotic heart disease of native coronary artery without angina pectoris: Secondary | ICD-10-CM | POA: Diagnosis present

## 2023-02-25 DIAGNOSIS — R0602 Shortness of breath: Secondary | ICD-10-CM | POA: Diagnosis not present

## 2023-02-25 DIAGNOSIS — I110 Hypertensive heart disease with heart failure: Secondary | ICD-10-CM | POA: Diagnosis present

## 2023-02-25 DIAGNOSIS — R0989 Other specified symptoms and signs involving the circulatory and respiratory systems: Secondary | ICD-10-CM | POA: Diagnosis not present

## 2023-02-25 DIAGNOSIS — E1151 Type 2 diabetes mellitus with diabetic peripheral angiopathy without gangrene: Secondary | ICD-10-CM | POA: Diagnosis present

## 2023-02-25 DIAGNOSIS — Z7982 Long term (current) use of aspirin: Secondary | ICD-10-CM

## 2023-02-25 DIAGNOSIS — T502X5A Adverse effect of carbonic-anhydrase inhibitors, benzothiadiazides and other diuretics, initial encounter: Secondary | ICD-10-CM | POA: Diagnosis not present

## 2023-02-25 DIAGNOSIS — Z7984 Long term (current) use of oral hypoglycemic drugs: Secondary | ICD-10-CM

## 2023-02-25 DIAGNOSIS — I255 Ischemic cardiomyopathy: Secondary | ICD-10-CM | POA: Diagnosis present

## 2023-02-25 DIAGNOSIS — Z95828 Presence of other vascular implants and grafts: Secondary | ICD-10-CM | POA: Diagnosis not present

## 2023-02-25 DIAGNOSIS — Z8679 Personal history of other diseases of the circulatory system: Secondary | ICD-10-CM

## 2023-02-25 DIAGNOSIS — Z888 Allergy status to other drugs, medicaments and biological substances status: Secondary | ICD-10-CM

## 2023-02-25 DIAGNOSIS — I428 Other cardiomyopathies: Secondary | ICD-10-CM | POA: Diagnosis present

## 2023-02-25 DIAGNOSIS — Z833 Family history of diabetes mellitus: Secondary | ICD-10-CM

## 2023-02-25 DIAGNOSIS — I509 Heart failure, unspecified: Secondary | ICD-10-CM | POA: Diagnosis not present

## 2023-02-25 DIAGNOSIS — M79603 Pain in arm, unspecified: Secondary | ICD-10-CM | POA: Diagnosis not present

## 2023-02-25 DIAGNOSIS — R0902 Hypoxemia: Secondary | ICD-10-CM

## 2023-02-25 DIAGNOSIS — I5022 Chronic systolic (congestive) heart failure: Secondary | ICD-10-CM | POA: Diagnosis not present

## 2023-02-25 DIAGNOSIS — I10 Essential (primary) hypertension: Secondary | ICD-10-CM | POA: Diagnosis not present

## 2023-02-25 DIAGNOSIS — Z951 Presence of aortocoronary bypass graft: Secondary | ICD-10-CM | POA: Diagnosis not present

## 2023-02-25 DIAGNOSIS — Z515 Encounter for palliative care: Secondary | ICD-10-CM | POA: Diagnosis not present

## 2023-02-25 DIAGNOSIS — K8020 Calculus of gallbladder without cholecystitis without obstruction: Secondary | ICD-10-CM | POA: Diagnosis not present

## 2023-02-25 DIAGNOSIS — R918 Other nonspecific abnormal finding of lung field: Secondary | ICD-10-CM | POA: Diagnosis not present

## 2023-02-25 DIAGNOSIS — I214 Non-ST elevation (NSTEMI) myocardial infarction: Secondary | ICD-10-CM | POA: Diagnosis not present

## 2023-02-25 DIAGNOSIS — Z79899 Other long term (current) drug therapy: Secondary | ICD-10-CM

## 2023-02-25 DIAGNOSIS — R079 Chest pain, unspecified: Secondary | ICD-10-CM | POA: Diagnosis not present

## 2023-02-25 DIAGNOSIS — F0390 Unspecified dementia without behavioral disturbance: Secondary | ICD-10-CM | POA: Diagnosis not present

## 2023-02-25 DIAGNOSIS — Z9889 Other specified postprocedural states: Secondary | ICD-10-CM

## 2023-02-25 DIAGNOSIS — Z9049 Acquired absence of other specified parts of digestive tract: Secondary | ICD-10-CM

## 2023-02-25 DIAGNOSIS — Z880 Allergy status to penicillin: Secondary | ICD-10-CM

## 2023-02-25 LAB — BLOOD GAS, VENOUS
Acid-base deficit: 3 mmol/L — ABNORMAL HIGH (ref 0.0–2.0)
Bicarbonate: 22.7 mmol/L (ref 20.0–28.0)
O2 Saturation: 60.7 %
Patient temperature: 37
pCO2, Ven: 42 mm[Hg] — ABNORMAL LOW (ref 44–60)
pH, Ven: 7.34 (ref 7.25–7.43)
pO2, Ven: 36 mm[Hg] (ref 32–45)

## 2023-02-25 LAB — CBC
HCT: 38.1 % — ABNORMAL LOW (ref 39.0–52.0)
Hemoglobin: 11.7 g/dL — ABNORMAL LOW (ref 13.0–17.0)
MCH: 23 pg — ABNORMAL LOW (ref 26.0–34.0)
MCHC: 30.7 g/dL (ref 30.0–36.0)
MCV: 75 fL — ABNORMAL LOW (ref 80.0–100.0)
Platelets: 243 10*3/uL (ref 150–400)
RBC: 5.08 MIL/uL (ref 4.22–5.81)
RDW: 18.5 % — ABNORMAL HIGH (ref 11.5–15.5)
WBC: 16.2 10*3/uL — ABNORMAL HIGH (ref 4.0–10.5)
nRBC: 0 % (ref 0.0–0.2)

## 2023-02-25 LAB — BASIC METABOLIC PANEL
Anion gap: 14 (ref 5–15)
BUN: 24 mg/dL — ABNORMAL HIGH (ref 8–23)
CO2: 19 mmol/L — ABNORMAL LOW (ref 22–32)
Calcium: 8.9 mg/dL (ref 8.9–10.3)
Chloride: 95 mmol/L — ABNORMAL LOW (ref 98–111)
Creatinine, Ser: 1.18 mg/dL (ref 0.61–1.24)
GFR, Estimated: >60 mL/min (ref >60)
Glucose, Bld: 146 mg/dL — ABNORMAL HIGH (ref 70–99)
Potassium: 3.9 mmol/L (ref 3.5–5.1)
Sodium: 128 mmol/L — ABNORMAL LOW (ref 135–145)

## 2023-02-25 LAB — HEPATIC FUNCTION PANEL
ALT: 20 U/L (ref 0–44)
AST: 26 U/L (ref 15–41)
Albumin: 4.3 g/dL (ref 3.5–5.0)
Alkaline Phosphatase: 70 U/L (ref 38–126)
Bilirubin, Direct: 0.2 mg/dL (ref 0.0–0.2)
Indirect Bilirubin: 0.9 mg/dL (ref 0.3–0.9)
Total Bilirubin: 1.1 mg/dL (ref <1.2)
Total Protein: 7.4 g/dL (ref 6.5–8.1)

## 2023-02-25 LAB — BRAIN NATRIURETIC PEPTIDE: B Natriuretic Peptide: 822.2 pg/mL — ABNORMAL HIGH (ref 0.0–100.0)

## 2023-02-25 LAB — SODIUM, URINE, RANDOM: Sodium, Ur: 27 mmol/L

## 2023-02-25 LAB — TROPONIN I (HIGH SENSITIVITY)
Troponin I (High Sensitivity): 52 ng/L — ABNORMAL HIGH (ref <18)
Troponin I (High Sensitivity): 110 ng/L (ref <18)

## 2023-02-25 LAB — PROTIME-INR
INR: 1.2 (ref 0.8–1.2)
Prothrombin Time: 14.9 s (ref 11.4–15.2)

## 2023-02-25 LAB — OSMOLALITY: Osmolality: 290 mosm/kg (ref 275–295)

## 2023-02-25 LAB — LACTIC ACID, PLASMA: Lactic Acid, Venous: 1.5 mmol/L (ref 0.5–1.9)

## 2023-02-25 LAB — OSMOLALITY, URINE: Osmolality, Ur: 289 mosm/kg — ABNORMAL LOW (ref 300–900)

## 2023-02-25 LAB — PROCALCITONIN: Procalcitonin: 0.11 ng/mL

## 2023-02-25 LAB — MAGNESIUM: Magnesium: 1.9 mg/dL (ref 1.7–2.4)

## 2023-02-25 MED ORDER — DIPHENHYDRAMINE HCL 50 MG/ML IJ SOLN
12.5000 mg | Freq: Three times a day (TID) | INTRAMUSCULAR | Status: DC | PRN
  Administered 2023-02-26: 12.5 mg via INTRAVENOUS
  Filled 2023-02-25: qty 1

## 2023-02-25 MED ORDER — ALBUTEROL SULFATE HFA 108 (90 BASE) MCG/ACT IN AERS: 2.0000 | INHALATION_SPRAY | RESPIRATORY_TRACT | Status: DC | PRN

## 2023-02-25 MED ORDER — DM-GUAIFENESIN ER 30-600 MG PO TB12: 1.0000 | ORAL_TABLET | Freq: Two times a day (BID) | ORAL | Status: DC | PRN

## 2023-02-25 MED ORDER — DOXYCYCLINE HYCLATE 100 MG IV SOLR
100.0000 mg | Freq: Two times a day (BID) | INTRAVENOUS | Status: DC
  Administered 2023-02-25: 100 mg via INTRAVENOUS
  Filled 2023-02-25: qty 100

## 2023-02-25 MED ORDER — HYDRALAZINE HCL 20 MG/ML IJ SOLN
5.0000 mg | INTRAMUSCULAR | Status: DC | PRN
  Administered 2023-02-26: 5 mg via INTRAVENOUS
  Filled 2023-02-25: qty 1

## 2023-02-25 MED ORDER — INSULIN ASPART 100 UNIT/ML IJ SOLN
0.0000 [IU] | Freq: Three times a day (TID) | INTRAMUSCULAR | Status: DC
  Administered 2023-02-26 (×2): 2 [IU] via SUBCUTANEOUS
  Administered 2023-02-26: 3 [IU] via SUBCUTANEOUS
  Administered 2023-02-27: 1 [IU] via SUBCUTANEOUS
  Administered 2023-02-27: 3 [IU] via SUBCUTANEOUS
  Administered 2023-02-27: 1 [IU] via SUBCUTANEOUS
  Administered 2023-02-28 (×2): 2 [IU] via SUBCUTANEOUS
  Administered 2023-02-28: 5 [IU] via SUBCUTANEOUS
  Administered 2023-03-01: 3 [IU] via SUBCUTANEOUS
  Administered 2023-03-01 (×2): 2 [IU] via SUBCUTANEOUS
  Administered 2023-03-02 – 2023-03-03 (×4): 3 [IU] via SUBCUTANEOUS
  Filled 2023-02-25 (×16): qty 1

## 2023-02-25 MED ORDER — ACETAMINOPHEN 325 MG PO TABS: 650.0000 mg | ORAL_TABLET | Freq: Four times a day (QID) | ORAL | Status: DC | PRN

## 2023-02-25 MED ORDER — FUROSEMIDE 10 MG/ML IJ SOLN
40.0000 mg | Freq: Once | INTRAMUSCULAR | Status: AC
  Administered 2023-02-25: 40 mg via INTRAVENOUS
  Filled 2023-02-25: qty 4

## 2023-02-25 MED ORDER — INSULIN ASPART 100 UNIT/ML IJ SOLN
0.0000 [IU] | Freq: Every day | INTRAMUSCULAR | Status: DC
  Administered 2023-02-26 – 2023-02-27 (×2): 3 [IU] via SUBCUTANEOUS
  Administered 2023-03-02: 2 [IU] via SUBCUTANEOUS
  Filled 2023-02-25 (×3): qty 1

## 2023-02-25 MED ORDER — ENOXAPARIN SODIUM 40 MG/0.4ML IJ SOSY
40.0000 mg | PREFILLED_SYRINGE | INTRAMUSCULAR | Status: DC
  Administered 2023-02-26: 40 mg via SUBCUTANEOUS
  Filled 2023-02-25: qty 0.4

## 2023-02-25 MED ORDER — NITROGLYCERIN IN D5W 200-5 MCG/ML-% IV SOLN
0.0000 ug/min | INTRAVENOUS | Status: DC
  Administered 2023-02-25: 5 ug/min via INTRAVENOUS
  Filled 2023-02-25: qty 250

## 2023-02-25 MED ORDER — SODIUM CHLORIDE 0.9 % IV SOLN
2.0000 g | INTRAVENOUS | Status: DC
  Administered 2023-02-25: 2 g via INTRAVENOUS
  Filled 2023-02-25: qty 20

## 2023-02-25 MED ORDER — SODIUM CHLORIDE 1 G PO TABS
1.0000 g | ORAL_TABLET | Freq: Two times a day (BID) | ORAL | Status: DC
  Administered 2023-02-26 (×2): 1 g via ORAL
  Filled 2023-02-25 (×2): qty 1

## 2023-02-25 NOTE — Sepsis Progress Note (Signed)
Following for sepsis monitoring ?

## 2023-02-25 NOTE — Progress Notes (Signed)
CODE SEPSIS - PHARMACY COMMUNICATION  **Broad Spectrum Antibiotics should be administered within 1 hour of Sepsis diagnosis**  Time Code Sepsis Called/Page Received: 2148  Antibiotics Ordered: Ceftriaxone, Doxycycline  Time of 1st antibiotic administration: 2235  [name], PharmD, [facility] - Closed 02/25/2023 9:53 PM

## 2023-02-25 NOTE — ED Triage Notes (Signed)
Pt arrived via EMS from home. Per EMS pt has been having SOB and CP since 1600 today. EMS found pt to have a o2 sat in the mid 80's on RA. Pt is now on 6l/Petal with a o2 sat of 95%. Pt has had HTN today for hours. Per EMS pt was found to have a sys of 200. Pt has 2 inches of nitroglycerin past on his chest. Pt has taken hydralazine at home with no improvement of BP. Pt sts that his CP is a 3/10.

## 2023-02-25 NOTE — ED Provider Notes (Signed)
[facility] Provider Note    Event Date/Time   First MD Initiated Contact with Patient 02/25/23 2122     (approximate)   History   Shortness of Breath   HPI  Christopher Snow is a 82 year old male with history of CHF with a EF 35 to 40% in 2020, CAD, HTN, diabetes presenting to the emergency department for evaluation of shortness of breath and chest pain.  History primarily obtained from patient's daughter.  Patient was seen by his primary care doctor a few days ago where his blood pressure was noted to be slightly elevated.  Since then, patient's blood pressure has been uptrending despite starting additional antihypertensive medicines.  Today, patient began to complain of shortness of breath and chest pain. With EMS, found to be hypoxic with O2 sat in the mid 80s on room air.  Placed on 6 L nasal cannula with improvement.  Initial systolics with EMS of 200.  Had nitroglycerin paste placed over his chest.  At the time of my initial evaluation, patient reports that he is chest pain-free.  Denies significant shortness of breath    Physical Exam   Triage Vital Signs: ED Triage Vitals  Encounter Vitals Group     BP 02/25/23 1738 (!) 188/65     Systolic BP Percentile --      Diastolic BP Percentile --      Pulse Rate 02/25/23 1738 96     Resp 02/25/23 1738 19     Temp 02/25/23 1738 98.6 F (37 C)     Temp Source 02/25/23 1738 Oral     SpO2 02/25/23 1738 96 %     Weight 02/25/23 1736 185 lb (83.9 kg)     Height 02/25/23 1736 6\' 3"  (1.905 m)     Head Circumference --      Peak Flow --      Pain Score 02/25/23 1736 3     Pain Loc --      Pain Education --      Exclude from Growth Chart --     Most recent vital signs: Vitals:   02/25/23 1738 02/25/23 2130  BP: (!) 188/65 (!) 187/74  Pulse: 96 80  Resp: 19 20  Temp: 98.6 F (37 C)   SpO2: 96% 92%     General: Awake, interactive  CV:  Regular rate, good peripheral perfusion.  Resp:  Unlabored  respirations, lung sounds diminished bilaterally with basilar crackles, on 4 L satting in the low to mid 90s Abd:  Nondistended.  Neuro:  Symmetric facial movement, fluid speech   ED Results / Procedures / Treatments   Labs (all labs ordered are listed, but only abnormal results are displayed) Labs Reviewed  BASIC METABOLIC PANEL - Abnormal; Notable for the following components:      Result Value   Sodium 128 (*)    Chloride 95 (*)    CO2 19 (*)    Glucose, Bld 146 (*)    BUN 24 (*)    All other components within normal limits  CBC - Abnormal; Notable for the following components:   WBC 16.2 (*)    Hemoglobin 11.7 (*)    HCT 38.1 (*)    MCV 75.0 (*)    MCH 23.0 (*)    RDW 18.5 (*)    All other components within normal limits  BLOOD GAS, VENOUS - Abnormal; Notable for the following components:   pCO2, Ven 42 (*)    Acid-base deficit  3.0 (*)    All other components within normal limits  BRAIN NATRIURETIC PEPTIDE - Abnormal; Notable for the following components:   B Natriuretic Peptide 822.2 (*)    All other components within normal limits  TROPONIN I (HIGH SENSITIVITY) - Abnormal; Notable for the following components:   Troponin I (High Sensitivity) 52 (*)    All other components within normal limits  TROPONIN I (HIGH SENSITIVITY) - Abnormal; Notable for the following components:   Troponin I (High Sensitivity) 110 (*)    All other components within normal limits  RESP PANEL BY RT-PCR (RSV, FLU A&B, COVID)  RVPGX2  CULTURE, BLOOD (ROUTINE X 2)  CULTURE, BLOOD (ROUTINE X 2)  PROTIME-INR  HEPATIC FUNCTION PANEL  LACTIC ACID, PLASMA  LACTIC ACID, PLASMA  PROCALCITONIN  BASIC METABOLIC PANEL  BASIC METABOLIC PANEL  MAGNESIUM  OSMOLALITY  OSMOLALITY, URINE  SODIUM, URINE, RANDOM  CBC  MAGNESIUM     EKG EKG independently reviewed interpreted by myself (ER attending) demonstrates:  EKG demonstrates normal sinus rhythm rate of 80, PR 184, QRS 154, QTc 514, right  bundle branch block morphology noted without appreciable superimposed ischemia  RADIOLOGY Imaging independently reviewed and interpreted by myself demonstrates:  CXR with bilateral pleural effusions with concerns for possible infiltrate, on my review more notable over the left base.  Bilateral opacities concerning for edema also noted.  PROCEDURES:  Critical Care performed: Yes, see critical care procedure note(s)  CRITICAL CARE Performed by: Trinna Post   Total critical care time: 35 minutes  Critical care time was exclusive of separately billable procedures and treating other patients.  Critical care was necessary to treat or prevent imminent or life-threatening deterioration.  Critical care was time spent personally by me on the following activities: development of treatment plan with patient and/or surrogate as well as nursing, discussions with consultants, evaluation of patient's response to treatment, examination of patient, obtaining history from patient or surrogate, ordering and performing treatments and interventions, ordering and review of laboratory studies, ordering and review of radiographic studies, pulse oximetry and re-evaluation of patient's condition.   Procedures   MEDICATIONS ORDERED IN ED: Medications  cefTRIAXone (ROCEPHIN) 2 g in sodium chloride 0.9 % 100 mL IVPB (2 g Intravenous New Bag/Given 02/25/23 2235)  doxycycline (VIBRAMYCIN) 100 mg in dextrose 5 % 250 mL IVPB (has no administration in time range)  nitroGLYCERIN 50 mg in dextrose 5 % 250 mL (0.2 mg/mL) infusion (10 mcg/min Intravenous Rate/Dose Change 02/25/23 2238)  albuterol (VENTOLIN HFA) 108 (90 Base) MCG/ACT inhaler 2 puff (has no administration in time range)  dextromethorphan-guaiFENesin (MUCINEX DM) 30-600 MG per 12 hr tablet 1 tablet (has no administration in time range)  diphenhydrAMINE (BENADRYL) injection 12.5 mg (has no administration in time range)  hydrALAZINE (APRESOLINE) injection 5 mg  (has no administration in time range)  acetaminophen (TYLENOL) tablet 650 mg (has no administration in time range)  sodium chloride tablet 1 g (has no administration in time range)  insulin aspart (novoLOG) injection 0-5 Units (has no administration in time range)  insulin aspart (novoLOG) injection 0-9 Units (has no administration in time range)  enoxaparin (LOVENOX) injection 40 mg (has no administration in time range)  furosemide (LASIX) injection 40 mg (40 mg Intravenous Given 02/25/23 2213)     IMPRESSION / MDM / ASSESSMENT AND PLAN / ED COURSE  I reviewed the triage vital signs and the nursing notes.  Differential diagnosis includes, but is not limited to, CHF exacerbation, hypertensive emergency with flash  pulmonary edema or cardiac strain, ACS, pneumonia, pneumothorax  Patient's presentation is most consistent with acute presentation with potential threat to life or bodily function.  82 year old male presenting to the emergency department for evaluation of shortness of breath found to be hypoxic and hypertensive on presentation.  Labs from triage notable for leukocytosis with WBC of 16.2, mild anemia noted without recent blood work for comparison.  Also noted to be hyponatremic with sodium of 128.  Initial troponin slightly elevated at 52.  X-Hasel Janish is concerning for volume overload.  Patient is hypoxic, but not in acute respiratory distress.  Will obtain VBG to evaluate for hypercarbia as well as add on BNP.  Remains hypertensive despite Nitropaste, will order nitroglycerin drip.  With leukocytosis and initial heart rate of 96, patient does meet sepsis criteria.  Ordered for empiric antibiotics with ceftriaxone and doxycycline.  Also ordered for dose of IV Lasix.    VBG fortunately resulted with normal pH at 7.34 with pCO2 of 42, do not feel there is indication for BiPAP currently.  However, do feel patient is appropriate for admission for evaluation of his acute hypoxia with suspected CHF  exacerbation and pneumonia.  Will reach out to hospitalist team.  Case discussed with Dr. Clyde Lundborg.  He will evaluate the patient for anticipated admission.    FINAL CLINICAL IMPRESSION(S) / ED DIAGNOSES   Final diagnoses:  Acute on chronic congestive heart failure, unspecified heart failure type (HCC)  Hypoxia  Community acquired pneumonia, unspecified laterality     Rx / DC Orders   ED Discharge Orders     None        Note:  This document was prepared using Dragon voice recognition software and may include unintentional dictation errors.   Trinna Post, MD 02/25/23 2250

## 2023-02-25 NOTE — H&P (Signed)
History and Physical    Christopher Snow UYQ:[id number] DOB: 07/03/1940 DOA: 02/25/2023  Referring MD/NP/PA:   PCP: Gracelyn Nurse, MD   Patient coming from:  The patient is coming from home.     Chief Complaint: chest pain  HPI: Christopher Snow is a 82 y.o. male with medical history significant of sCHF with EF 35-405,  HTN, HLD, DM, CAD, CBAG, PVD, dementia, who presents with chest pain.  Per his daughter at bedside, pt started having chest pain since afternoon.  The chest pain is located in left side of chest, radiating to the left arm.  Patient cannot characterize his pain in detail due to dementia.  Patient has mild dry cough.  Patient does not have significant shortness breath, but was found to have oxygen desaturation to 80s on room air in ED, which improved to 92-96% on 4 L of oxygen.  Patient is normally not using oxygen at home.  No nausea, vomiting, diarrhea or abdominal pain.  No symptoms of UTI.  No fall or injury.  Patient's mental status is at baseline per her daughter.  Patient blood pressure is significantly elevated with SBP 200s, nitroglycerin drip is started in ED.  Data reviewed independently and ED Course: pt was found to have BNP 822, negative PCR for flu, RSV and COVID 19, WBC 16.2, lactic acid 1.5 , procalcitonin 0.11, GFR> 60, INR 1.2, trop  52 --> 110 --> 76, Na 128, heart rate 96 --> 80, oxygen saturation 80% on room air, which improved to 92-96% on 4L of oxygen, temperature normal.  VBG with pH 7.34, CO2 42, O2 36.  Chest x-ray showed cardiomegaly, vascular congestion, bilateral small pleural effusion and bilateral atelectasis.  Patient is admitted to PCU as inpt.  EKG: I have personally reviewed.  Sinus rhythm, QTc 514, bifascicular block, poor R wave progression   Review of Systems:   General: no fevers, chills, no body weight gain, fatigue HEENT: no blurry vision, hearing changes or sore throat Respiratory: no dyspnea, has coughing, no wheezing CV:  has chest pain, no palpitations GI: no nausea, vomiting, abdominal pain, diarrhea, constipation GU: no dysuria, burning on urination, increased urinary frequency, hematuria  Ext: no leg edema Neuro: no unilateral weakness, numbness, or tingling, no vision change or hearing loss Skin: no rash, no skin tear. MSK: No muscle spasm, no deformity, no limitation of range of movement in spin Heme: No easy bruising.  Travel history: No recent long distant travel.   Allergy:  Allergies  Allergen Reactions   Penicillins    Statins Other (See Comments)    Past Medical History:  Diagnosis Date   CHF (congestive heart failure) (HCC)    Diabetes mellitus without complication (HCC)    Heart attack (HCC)    Hyperlipidemia    Hypertension     Past Surgical History:  Procedure Laterality Date   APPENDECTOMY     CARDIAC SURGERY     five bypass   CORONARY ARTERY BYPASS GRAFT     HERNIA REPAIR     PR VEIN BYPASS GRAFT,AORTO-FEM-POP      Social History:  reports that he has quit smoking. He has never used smokeless tobacco. He reports that he does not drink alcohol and does not use drugs.  Family History:  Family History  Problem Relation Age of Onset   Heart disease Mother    Heart disease Father    Diabetes Maternal Grandmother    Diabetes Maternal Grandfather  Prior to Admission medications   Medication Sig Start Date End Date Taking? Authorizing Provider  albuterol (VENTOLIN HFA) 108 (90 Base) MCG/ACT inhaler 2 puffs q.i.d. p.r.n. short of breath, wheezing, or cough 01/14/18   [provider]  aspirin EC 81 MG tablet Take 81 mg by mouth daily.    [provider]  atorvastatin (LIPITOR) 10 MG tablet Take 5 mg by mouth daily at 6 PM.    [provider]  carvedilol (COREG) 3.125 MG tablet Take 1 tablet (3.125 mg total) by mouth 2 (two) times daily with a meal. 08/29/18   Shaune Pollack, MD  fluticasone [facility]) 50 MCG/ACT nasal spray Place 2 sprays into both  nostrils daily. 06/15/17   [provider]  furosemide (LASIX) 40 MG tablet Take 1 tablet (40 mg total) by mouth daily. 08/29/18 12/05/18  Shaune Pollack, MD  glipiZIDE (GLUCOTROL) 10 MG tablet Take 10 mg by mouth 2 (two) times daily before a meal.  02/16/17   [provider]  isosorbide mononitrate (IMDUR) 30 MG 24 hr tablet TAKE ONE TABLET BY MOUTH ONCE DAILY 07/27/18   [provider]  metFORMIN (GLUCOPHAGE) 1000 MG tablet Take 1,000 mg by mouth 2 (two) times daily with a meal.    [provider]  nitroGLYCERIN (NITROSTAT) 0.4 MG SL tablet Place 1 tablet (0.4 mg total) under the tongue every 5 (five) minutes as needed for chest pain. 08/29/18   Shaune Pollack, MD  sacubitril-valsartan (ENTRESTO) 24-26 MG Take 1 tablet by mouth 2 (two) times daily. 12/05/18   Delma Freeze, FNP    Physical Exam: Vitals:   02/26/23 0015 02/26/23 0030 02/26/23 0100 02/26/23 0102  BP: (!) 182/83 (!) 174/86 (!) 176/79 (!) 176/79  Pulse: (!) 104 (!) 107 (!) 105   Resp: (!) 27 16 16    Temp:      TempSrc:      SpO2: 93% 92% 91%   Weight:      Height:       General: Not in acute distress HEENT:       Eyes: PERRL, EOMI, no jaundice       ENT: No discharge from the ears and nose, no pharynx injection, no tonsillar enlargement.        Neck: positive JVD, no bruit, no mass felt. Heme: No neck lymph node enlargement. Cardiac: S1/S2, RRR, No murmurs, No gallops or rubs. Respiratory: has fine crackles bilaterally GI: Soft, nondistended, nontender, no rebound pain, no organomegaly, BS present. GU: No hematuria Ext: No pitting leg edema bilaterally. 1+DP/PT pulse bilaterally. Musculoskeletal: No joint deformities, No joint redness or warmth, no limitation of ROM in spin. Skin: No rashes.  Neuro: Alert, following command, cranial nerves II-XII grossly intact, moves all extremities normally.  Psych: Patient is not psychotic, no suicidal or hemocidal ideation.  Labs on Admission: I have  personally reviewed following labs and imaging studies  CBC: Recent Labs  Lab 02/25/23 1739  WBC 16.2*  HGB 11.7*  HCT 38.1*  MCV 75.0*  PLT 243   Basic Metabolic Panel: Recent Labs  Lab 02/25/23 1739 02/25/23 2355  NA 128* 129*  K 3.9 4.4  CL 95* 96*  CO2 19* 20*  GLUCOSE 146* 256*  BUN 24* 26*  CREATININE 1.18 1.28*  CALCIUM 8.9 8.6*  MG 1.9  --    GFR: Estimated Creatinine Clearance: 51 mL/min (A) (by C-G formula based on SCr of 1.28 mg/dL (H)). Liver Function Tests: Recent Labs  Lab 02/25/23 2139  AST 26  ALT 20  ALKPHOS 70  BILITOT 1.1  PROT 7.4  ALBUMIN 4.3   No results for input(s): "LIPASE", "AMYLASE" in the last 168 hours. No results for input(s): "AMMONIA" in the last 168 hours. Coagulation Profile: Recent Labs  Lab 02/25/23 1739  INR 1.2   Cardiac Enzymes: No results for input(s): "CKTOTAL", "CKMB", "CKMBINDEX", "TROPONINI" in the last 168 hours. BNP (last 3 results) No results for input(s): "PROBNP" in the last 8760 hours. HbA1C: No results for input(s): "HGBA1C" in the last 72 hours. CBG: Recent Labs  Lab 02/26/23 0001  GLUCAP 252*   Lipid Profile: No results for input(s): "CHOL", "HDL", "LDLCALC", "TRIG", "CHOLHDL", "LDLDIRECT" in the last 72 hours. Thyroid Function Tests: No results for input(s): "TSH", "T4TOTAL", "FREET4", "T3FREE", "THYROIDAB" in the last 72 hours. Anemia Panel: No results for input(s): "VITAMINB12", "FOLATE", "FERRITIN", "TIBC", "IRON", "RETICCTPCT" in the last 72 hours. Urine analysis:    Component Value Date/Time   COLORURINE Yellow 10/10/2011 1535   APPEARANCEUR Turbid 10/10/2011 1535   LABSPEC 1.014 10/10/2011 1535   PHURINE 5.0 10/10/2011 1535   GLUCOSEU >=500 10/10/2011 1535   HGBUR 3+ 10/10/2011 1535   BILIRUBINUR Negative 10/10/2011 1535   KETONESUR Negative 10/10/2011 1535   PROTEINUR 30 mg/dL 16/12/9602 5409   NITRITE Negative 10/10/2011 1535   LEUKOCYTESUR 3+ 10/10/2011 1535   Sepsis  Labs: @LABRCNTIP (procalcitonin:4,lacticidven:4) ) Recent Results (from the past 240 hours)  Resp panel by RT-PCR (RSV, Flu A&B, Covid) Anterior Nasal Swab     Status: None   Collection Time: 02/25/23 10:09 PM   Specimen: Anterior Nasal Swab  Result Value Ref Range Status   SARS Coronavirus 2 by RT PCR NEGATIVE NEGATIVE Final    Comment: (NOTE) SARS-CoV-2 target nucleic acids are NOT DETECTED.  The SARS-CoV-2 RNA is generally detectable in upper respiratory specimens during the acute phase of infection. The lowest concentration of SARS-CoV-2 viral copies this assay can detect is 138 copies/mL. A negative result does not preclude SARS-Cov-2 infection and should not be used as the sole basis for treatment or other patient management decisions. A negative result may occur with  improper specimen collection/handling, submission of specimen other than nasopharyngeal swab, presence of viral mutation(s) within the areas targeted by this assay, and inadequate number of viral copies(<138 copies/mL). A negative result must be combined with clinical observations, patient history, and epidemiological information. The expected result is Negative.  Fact Sheet for Patients:  BloggerCourse.com  Fact Sheet for Healthcare Providers:  SeriousBroker.it  This test is no t yet approved or cleared by the [place] FDA and  has been authorized for detection and/or diagnosis of SARS-CoV-2 by FDA under an Emergency Use Authorization (EUA). This EUA will remain  in effect (meaning this test can be used) for the duration of the COVID-19 declaration under Section 564(b)(1) of the Act, 21 U.S.C.section 360bbb-3(b)(1), unless the authorization is terminated  or revoked sooner.       Influenza A by PCR NEGATIVE NEGATIVE Final   Influenza B by PCR NEGATIVE NEGATIVE Final    Comment: (NOTE) The Xpert Xpress SARS-CoV-2/FLU/RSV plus assay is intended as an  aid in the diagnosis of influenza from Nasopharyngeal swab specimens and should not be used as a sole basis for treatment. Nasal washings and aspirates are unacceptable for Xpert Xpress SARS-CoV-2/FLU/RSV testing.  Fact Sheet for Patients: BloggerCourse.com  Fact Sheet for Healthcare Providers: SeriousBroker.it  This test is not yet approved or cleared by the [place] FDA and has  been authorized for detection and/or diagnosis of SARS-CoV-2 by FDA under an Emergency Use Authorization (EUA). This EUA will remain in effect (meaning this test can be used) for the duration of the COVID-19 declaration under Section 564(b)(1) of the Act, 21 U.S.C. section 360bbb-3(b)(1), unless the authorization is terminated or revoked.     Resp Syncytial Virus by PCR NEGATIVE NEGATIVE Final    Comment: (NOTE) Fact Sheet for Patients: BloggerCourse.com  Fact Sheet for Healthcare Providers: SeriousBroker.it  This test is not yet approved or cleared by the [place] FDA and has been authorized for detection and/or diagnosis of SARS-CoV-2 by FDA under an Emergency Use Authorization (EUA). This EUA will remain in effect (meaning this test can be used) for the duration of the COVID-19 declaration under Section 564(b)(1) of the Act, 21 U.S.C. section 360bbb-3(b)(1), unless the authorization is terminated or revoked.  Performed at [facility], [address] [place], [place] [address]      Radiological Exams on Admission: DG Chest 2 View Result Date: 02/25/2023 CLINICAL DATA:  Shortness of breath and chest pain. EXAM: CHEST - 2 VIEW COMPARISON:  Chest radiograph dated 03/28/2020. FINDINGS: Small bilateral pleural effusions and bibasilar atelectasis or infiltrate. There is mild cardiomegaly with mild central vascular congestion. No pneumothorax. Median sternotomy wires and CABG  vascular clips. No acute osseous pathology. IMPRESSION: 1. Small bilateral pleural effusions and bibasilar atelectasis or infiltrate. 2. Mild cardiomegaly with mild central vascular congestion. Electronically Signed   By: Elgie Collard M.D.   On: 02/25/2023 18:48      Assessment/Plan Principal Problem:   Acute on chronic systolic CHF (congestive heart failure) (HCC) Active Problems:   Acute respiratory failure with hypoxia (HCC)   Hypertensive emergency   CAD (coronary artery disease)   Myocardial injury   HLD (hyperlipidemia)   Diabetes mellitus with peripheral circulatory disorder (HCC)   Hyponatremia   Leukocytosis   Dementia without behavioral disturbance (HCC)   Assessment and Plan:  Acute respiratory failure with hypoxia due to acute on chronic systolic CHF (congestive heart failure) (HCC): Patient is 4 L of new oxygen requirement.  2D echo on 08/27/2018 showed EF of 35-40%.  Patient does not have leg edema, but has elevated BNP 822, positive JVD, crackles on auscultation, vascular congestion on chest x-ray, clinically consistent with CHF exacerbation.  Due to significantly elevated blood pressure, patient may have flash of pulmonary edema.  Patient has leukocytosis with WBC 16.2, received 1 dose of Rocephin or doxycycline in ED due to concerning for pneumonia, but patient does not have fever, lactic acid normal at 1.5, procalcitonin 0.11, clinically does not seem to have pneumonia.  Will discontinue antibiotics and observe closely.  If patient develops fever, will need restart antibiotics.  Patient has hyponatremia with sodium 128 which increased to 129 after giving 40 mg of lasix in ED. will need to monitor sodium closely   -Will admit to PCU as inpatient -continue nitroglycerin drip - one dose of Lasix 40 mg was given in ED, will continue lasix  40 mg bid -2d echo -Daily weights -strict I/O's -Low salt diet -Fluid restriction -As needed bronchodilators for shortness of  breath  Hypertensive emergency: SBP is up to 200s. -Continue nitroglycerin drip -Will give as needed hydralazine IV when patient is off nitroglycerin drip -Continue home amlodipine 10 mg daily -Increase hydralazine dose from 25 to 50 mg 3 times daily -Hold Imdur since patient is on nitroglycerin drip  CAD (coronary artery disease) and Myocardial injury: s/p  of CBAG. Trop  52 --> 110 --> 76, trending down.  Patient had LDL 82 and A1c 6.9 on 02/19/2023. -As needed morphine for chest pain -Patient is on nitroglycerin drip -Aspirin and Lipitor -Trend troponin  HLD (hyperlipidemia) -Lipitor  Diabetes mellitus with peripheral circulatory disorder [facility]): Patient is taking metformin, Jardiance and glipizide -Sliding scale insulin  Hyponatremia: Sodium 128 --> increased to 129 after giving 40 mg of Lasix in ED - Will check urine sodium, urine osmolality, serum osmolality. - Fluid restriction - Sodium chloride tablet 1 g twice daily - f/u by BMP q8h - avoid over correction too fast due to risk of central pontine myelinolysis  Leukocytosis: WBC 16.2.  No fever, procalcitonin 0.11, clinically does not seem to have pneumonia. -Hold off antibiotics now (patient received 1 dose of Rocephin and doxycycline in ED) -Follow-up blood culture and sputum culture  Dementia without behavioral disturbance [facility]): Mental status at baseline -Fall precaution       DVT ppx:  SQ Lovenox  Code Status: Full code   Family Communication: Yes, patient's daughter   at bed side.       Disposition Plan:  Anticipate discharge back to previous environment  Consults called:  none  Admission status and Level of care: Progressive:   as inpt       Dispo: The patient is from: Home              Anticipated d/c is to: Home              Anticipated d/c date is: 2 days              Patient currently is not medically stable to d/c.    Severity of Illness:  The appropriate patient status for this patient is  INPATIENT. Inpatient status is judged to be reasonable and necessary in order to provide the required intensity of service to ensure the patient's safety. The patient's presenting symptoms, physical exam findings, and initial radiographic and laboratory data in the context of their chronic comorbidities is felt to place them at high risk for further clinical deterioration. Furthermore, it is not anticipated that the patient will be medically stable for discharge from the hospital within 2 midnights of admission.   * I certify that at the point of admission it is my clinical judgment that the patient will require inpatient hospital care spanning beyond 2 midnights from the point of admission due to high intensity of service, high risk for further deterioration and high frequency of surveillance required.*       Date of Service 02/26/2023    [name] [facility]   If 7PM-7AM, please contact night-coverage [email or web address] 02/26/2023, 1:14 AM

## 2023-02-26 ENCOUNTER — Encounter: Payer: Self-pay | Admitting: Internal Medicine

## 2023-02-26 ENCOUNTER — Inpatient Hospital Stay (HOSPITAL_COMMUNITY)
Admit: 2023-02-26 | Discharge: 2023-02-26 | Disposition: A | Discharge status: Home / Self Care | Payer: No Typology Code available for payment source | Attending: Internal Medicine | Admitting: Internal Medicine

## 2023-02-26 ENCOUNTER — Inpatient Hospital Stay: Payer: No Typology Code available for payment source

## 2023-02-26 DIAGNOSIS — I5023 Acute on chronic systolic (congestive) heart failure: Secondary | ICD-10-CM

## 2023-02-26 DIAGNOSIS — F0390 Unspecified dementia without behavioral disturbance: Secondary | ICD-10-CM | POA: Diagnosis present

## 2023-02-26 LAB — TROPONIN I (HIGH SENSITIVITY)
Troponin I (High Sensitivity): 1404 ng/L (ref <18)
Troponin I (High Sensitivity): 143 ng/L (ref <18)
Troponin I (High Sensitivity): 1715 ng/L (ref <18)
Troponin I (High Sensitivity): 2238 ng/L (ref <18)
Troponin I (High Sensitivity): 3098 ng/L (ref <18)
Troponin I (High Sensitivity): 3866 ng/L (ref <18)
Troponin I (High Sensitivity): 453 ng/L (ref <18)
Troponin I (High Sensitivity): 63 ng/L — ABNORMAL HIGH (ref <18)
Troponin I (High Sensitivity): 76 ng/L — ABNORMAL HIGH (ref <18)

## 2023-02-26 LAB — BASIC METABOLIC PANEL
Anion gap: 13 (ref 5–15)
Anion gap: 13 (ref 5–15)
BUN: 26 mg/dL — ABNORMAL HIGH (ref 8–23)
BUN: 26 mg/dL — ABNORMAL HIGH (ref 8–23)
CO2: 20 mmol/L — ABNORMAL LOW (ref 22–32)
CO2: 20 mmol/L — ABNORMAL LOW (ref 22–32)
Calcium: 8.4 mg/dL — ABNORMAL LOW (ref 8.9–10.3)
Calcium: 8.6 mg/dL — ABNORMAL LOW (ref 8.9–10.3)
Chloride: 94 mmol/L — ABNORMAL LOW (ref 98–111)
Chloride: 96 mmol/L — ABNORMAL LOW (ref 98–111)
Creatinine, Ser: 1.26 mg/dL — ABNORMAL HIGH (ref 0.61–1.24)
Creatinine, Ser: 1.28 mg/dL — ABNORMAL HIGH (ref 0.61–1.24)
GFR, Estimated: 55 mL/min — ABNORMAL LOW (ref >60)
GFR, Estimated: 56 mL/min — ABNORMAL LOW (ref >60)
Glucose, Bld: 247 mg/dL — ABNORMAL HIGH (ref 70–99)
Glucose, Bld: 256 mg/dL — ABNORMAL HIGH (ref 70–99)
Potassium: 4.1 mmol/L (ref 3.5–5.1)
Potassium: 4.4 mmol/L (ref 3.5–5.1)
Sodium: 127 mmol/L — ABNORMAL LOW (ref 135–145)
Sodium: 129 mmol/L — ABNORMAL LOW (ref 135–145)

## 2023-02-26 LAB — CBC
HCT: 33.1 % — ABNORMAL LOW (ref 39.0–52.0)
Hemoglobin: 10.5 g/dL — ABNORMAL LOW (ref 13.0–17.0)
MCH: 23.6 pg — ABNORMAL LOW (ref 26.0–34.0)
MCHC: 31.7 g/dL (ref 30.0–36.0)
MCV: 74.5 fL — ABNORMAL LOW (ref 80.0–100.0)
Platelets: 192 10*3/uL (ref 150–400)
RBC: 4.44 MIL/uL (ref 4.22–5.81)
RDW: 18.4 % — ABNORMAL HIGH (ref 11.5–15.5)
WBC: 13.5 10*3/uL — ABNORMAL HIGH (ref 4.0–10.5)
nRBC: 0 % (ref 0.0–0.2)

## 2023-02-26 LAB — ECHOCARDIOGRAM COMPLETE
AR max vel: 2.53 cm2
AV Area VTI: 2.67 cm2
AV Area mean vel: 2.63 cm2
AV Mean grad: 2 mm[Hg]
AV Peak grad: 5.2 mm[Hg]
Ao pk vel: 1.14 m/s
Area-P 1/2: 4.54 cm2
Calc EF: 29.8 %
Height: 75 in
MV VTI: 2.45 cm2
S' Lateral: 4.9 cm
Single Plane A2C EF: 35.7 %
Single Plane A4C EF: 25.7 %
Weight: 2960 [oz_av]

## 2023-02-26 LAB — PROTIME-INR
INR: 1.3 — ABNORMAL HIGH (ref 0.8–1.2)
Prothrombin Time: 15.9 s — ABNORMAL HIGH (ref 11.4–15.2)

## 2023-02-26 LAB — APTT: aPTT: 38 s — ABNORMAL HIGH (ref 24–36)

## 2023-02-26 LAB — LACTIC ACID, PLASMA: Lactic Acid, Venous: 1.5 mmol/L (ref 0.5–1.9)

## 2023-02-26 LAB — RESP PANEL BY RT-PCR (RSV, FLU A&B, COVID)  RVPGX2
Influenza A by PCR: NEGATIVE
Influenza B by PCR: NEGATIVE
Resp Syncytial Virus by PCR: NEGATIVE
SARS Coronavirus 2 by RT PCR: NEGATIVE

## 2023-02-26 LAB — MAGNESIUM: Magnesium: 1.8 mg/dL (ref 1.7–2.4)

## 2023-02-26 LAB — CBG MONITORING, ED
Glucose-Capillary: 177 mg/dL — ABNORMAL HIGH (ref 70–99)
Glucose-Capillary: 198 mg/dL — ABNORMAL HIGH (ref 70–99)
Glucose-Capillary: 243 mg/dL — ABNORMAL HIGH (ref 70–99)
Glucose-Capillary: 252 mg/dL — ABNORMAL HIGH (ref 70–99)

## 2023-02-26 MED ORDER — ATORVASTATIN CALCIUM 10 MG PO TABS
5.0000 mg | ORAL_TABLET | Freq: Every day | ORAL | Status: DC
  Administered 2023-02-26 – 2023-03-02 (×5): 5 mg via ORAL
  Filled 2023-02-26 (×2): qty 1
  Filled 2023-02-26 (×2): qty 0.5
  Filled 2023-02-26 (×2): qty 1

## 2023-02-26 MED ORDER — FUROSEMIDE 10 MG/ML IJ SOLN
60.0000 mg | Freq: Two times a day (BID) | INTRAMUSCULAR | Status: DC
  Administered 2023-02-26: 60 mg via INTRAVENOUS
  Filled 2023-02-26: qty 8

## 2023-02-26 MED ORDER — AMLODIPINE BESYLATE 10 MG PO TABS
10.0000 mg | ORAL_TABLET | Freq: Every day | ORAL | Status: DC
  Administered 2023-02-26 – 2023-02-27 (×2): 10 mg via ORAL
  Filled 2023-02-26: qty 1
  Filled 2023-02-26: qty 2

## 2023-02-26 MED ORDER — HEPARIN (PORCINE) 25000 UT/250ML-% IV SOLN
1450.0000 [IU]/h | INTRAVENOUS | Status: DC
Start: 2023-02-26 — End: 2023-03-03
  Administered 2023-02-26: 1000 [IU]/h via INTRAVENOUS
  Administered 2023-02-27: 1150 [IU]/h via INTRAVENOUS
  Administered 2023-02-28 (×2): 1300 [IU]/h via INTRAVENOUS
  Administered 2023-03-01 – 2023-03-02 (×2): 1450 [IU]/h via INTRAVENOUS
  Filled 2023-02-26 (×6): qty 250

## 2023-02-26 MED ORDER — ALBUTEROL SULFATE (2.5 MG/3ML) 0.083% IN NEBU: 2.5000 mg | INHALATION_SOLUTION | RESPIRATORY_TRACT | Status: DC | PRN

## 2023-02-26 MED ORDER — FUROSEMIDE 10 MG/ML IJ SOLN
40.0000 mg | Freq: Once | INTRAMUSCULAR | Status: AC
  Administered 2023-02-26: 40 mg via INTRAVENOUS
  Filled 2023-02-26: qty 4

## 2023-02-26 MED ORDER — FLUTICASONE PROPIONATE 50 MCG/ACT NA SUSP
2.0000 | Freq: Every day | NASAL | Status: DC
  Administered 2023-02-26 – 2023-03-03 (×6): 2 via NASAL
  Filled 2023-02-26: qty 16

## 2023-02-26 MED ORDER — HYDRALAZINE HCL 50 MG PO TABS
50.0000 mg | ORAL_TABLET | Freq: Three times a day (TID) | ORAL | Status: DC
Start: 2023-02-26 — End: 2023-03-03
  Administered 2023-02-26 – 2023-03-03 (×16): 50 mg via ORAL
  Filled 2023-02-26 (×16): qty 1

## 2023-02-26 MED ORDER — ISOSORBIDE MONONITRATE ER 30 MG PO TB24
30.0000 mg | ORAL_TABLET | Freq: Every day | ORAL | Status: DC
  Administered 2023-02-26 – 2023-02-27 (×2): 30 mg via ORAL
  Filled 2023-02-26 (×2): qty 1

## 2023-02-26 MED ORDER — HEPARIN BOLUS VIA INFUSION
4000.0000 [IU] | Freq: Once | INTRAVENOUS | Status: AC
  Administered 2023-02-26: 4000 [IU] via INTRAVENOUS
  Filled 2023-02-26: qty 4000

## 2023-02-26 MED ORDER — MORPHINE SULFATE (PF) 2 MG/ML IV SOLN
1.0000 mg | INTRAVENOUS | Status: DC | PRN
  Administered 2023-02-26: 1 mg via INTRAVENOUS
  Filled 2023-02-26: qty 1

## 2023-02-26 MED ORDER — CARVEDILOL 6.25 MG PO TABS
3.1250 mg | ORAL_TABLET | Freq: Two times a day (BID) | ORAL | Status: DC
  Administered 2023-02-26: 3.125 mg via ORAL
  Filled 2023-02-26: qty 1

## 2023-02-26 MED ORDER — SACUBITRIL-VALSARTAN 24-26 MG PO TABS: 1.0000 | ORAL_TABLET | Freq: Two times a day (BID) | ORAL | Status: DC

## 2023-02-26 MED ORDER — FUROSEMIDE 10 MG/ML IJ SOLN
40.0000 mg | Freq: Two times a day (BID) | INTRAMUSCULAR | Status: DC
  Administered 2023-02-26: 40 mg via INTRAVENOUS
  Filled 2023-02-26: qty 4

## 2023-02-26 MED ORDER — ASPIRIN 81 MG PO TBEC
81.0000 mg | DELAYED_RELEASE_TABLET | Freq: Every day | ORAL | Status: DC
  Administered 2023-02-26 – 2023-03-03 (×6): 81 mg via ORAL
  Filled 2023-02-26 (×6): qty 1

## 2023-02-26 MED ORDER — CARVEDILOL 6.25 MG PO TABS
6.2500 mg | ORAL_TABLET | Freq: Two times a day (BID) | ORAL | Status: DC
  Administered 2023-02-27: 6.25 mg via ORAL
  Filled 2023-02-26 (×2): qty 1

## 2023-02-26 MED ORDER — HALOPERIDOL LACTATE 5 MG/ML IJ SOLN
1.0000 mg | Freq: Once | INTRAMUSCULAR | Status: AC
  Administered 2023-02-26: 1 mg via INTRAVENOUS
  Filled 2023-02-26: qty 1

## 2023-02-26 NOTE — Progress Notes (Signed)
*  PRELIMINARY RESULTS* Echocardiogram 2D Echocardiogram has been performed.  Carolyne Fiscal 02/26/2023, 11:49 AM

## 2023-02-26 NOTE — ED Notes (Signed)
Sreenath MD, aware of o2 sats trending between 88-92%. Pt in NAD, speaking calmly and unlabored.

## 2023-02-26 NOTE — ED Notes (Signed)
Pt complaining of left sided chest pain. Nitroglycerin rate changed.

## 2023-02-26 NOTE — Plan of Care (Signed)

## 2023-02-26 NOTE — Progress Notes (Signed)
PROGRESS NOTE    Christopher Snow  WUJ:[id number] DOB: [birth year] DOA: 02/25/2023 PCP: Gracelyn Nurse, MD    Brief Narrative:  82 y.o. male with medical history significant of sCHF with EF 35-405,  HTN, HLD, DM, CAD, CBAG, PVD, dementia, who presents with chest pain.   Per his daughter at bedside, pt started having chest pain since afternoon.  The chest pain is located in left side of chest, radiating to the left arm.  Patient cannot characterize his pain in detail due to dementia.  Patient has mild dry cough.  Patient does not have significant shortness breath, but was found to have oxygen desaturation to 80s on room air in ED, which improved to 92-96% on 4 L of oxygen.  Patient is normally not using oxygen at home.  Patient blood pressure is significantly elevated with SBP 200s, nitroglycerin drip is started in ED.    Assessment & Plan:   Principal Problem:   Acute on chronic systolic CHF (congestive heart failure) (HCC) Active Problems:   Acute respiratory failure with hypoxia (HCC)   Hypertensive emergency   CAD (coronary artery disease)   Myocardial injury   HLD (hyperlipidemia)   Diabetes mellitus with peripheral circulatory disorder (HCC)   Hyponatremia   Leukocytosis   Dementia without behavioral disturbance (HCC) Acute respiratory failure with hypoxia due to acute on chronic systolic CHF (congestive heart failure) (HCC):  Patient is 4 L of new oxygen requirement.  2D echo on 08/27/2018 showed EF of 35-40%.  Patient does not have leg edema, but has elevated BNP 822, positive JVD, crackles on auscultation, vascular congestion on chest x-ray, clinically consistent with CHF exacerbation.   Plan: Restart home antihypertensive medications Lasix 40 mg IV twice daily 2D echocardiogram Strict and outs, daily weights, fluid restrict Consider cardiology consultation pending results of echocardiogram    Hypertensive emergency:  Initially noted systolic blood pressure in the  200s Plan: Wean nitroglycerin drip Amlodipine 10 mg daily Hydralazine 50 mg 3 times daily Imdur Coreg Daughter states patient no longer on Entresto.  Will stop for now    CAD (coronary artery disease) and Myocardial injury:  s/p of CBAG. Trop  52 --> 110 --> 76, trending down.  Patient had LDL 82 and A1c 6.9 on 02/19/2023. Plan: Telemetry Aspirin Statin Echo as above  HLD (hyperlipidemia) -Lipitor   Diabetes mellitus with peripheral circulatory disorder [facility]):  Patient is taking metformin, Jardiance and glipizide -Sliding scale insulin   Hyponatremia:  Suspect hypervolemic hyponatremia Plan: Diuresis as above Recheck sodium in a.m.   Leukocytosis:  WBC 16.2.  No fever, procalcitonin 0.11 No clinical evidence of infection Plan: Hold antibiotics Follow blood cultures Monitor vitals and fever curve Repeat labs in a.m.   Dementia without behavioral disturbance (HCC)  Mental status at baseline -Fall precaution    DVT prophylaxis: Lovenox Code Status: Full Family Communication: Daughter at bedside 12/13 Disposition Plan: Status is: Inpatient Remains inpatient appropriate because: Acute on chronic systolic congestive heart failure, hypertensive urgency   Level of care: Telemetry Cardiac  Consultants:  None  Procedures:  None  Antimicrobials: None   Subjective: Seen and examined.  Resting bed.  Reports symptomatic improvement since admission.  Objective: Vitals:   02/26/23 0900 02/26/23 0910 02/26/23 0914 02/26/23 1035  BP: (!) 134/93 (!) 149/81  (!) 156/70  Pulse: (!) 107   (!) 105  Resp: (!) 30   (!) 21  Temp:   98.4 F (36.9 C)   TempSrc:   Oral  SpO2: 91%   91%  Weight:      Height:        Intake/Output Summary (Last 24 hours) at 02/26/2023 1318 Last data filed at 02/26/2023 1147 Gross per 24 hour  Intake --  Output 1800 ml  Net -1800 ml   Filed Weights   02/25/23 1736  Weight: 83.9 kg    Examination:  General exam: Appears  calm and comfortable  Respiratory system: Scattered crackles bilaterally.  Normal work of breathing.  3 L Cardiovascular system: S1-S2, RRR, no murmurs, no pedal edema Gastrointestinal system: Soft, NT/ND, normal bowel sounds Central nervous system: Alert and oriented. No focal neurological deficits. Extremities: Symmetric 5 x 5 power. Skin: No rashes, lesions or ulcers Psychiatry: Judgement and insight appear normal. Mood & affect appropriate.     Data Reviewed: I have personally reviewed following labs and imaging studies  CBC: Recent Labs  Lab 02/25/23 1739 02/26/23 0512  WBC 16.2* 13.5*  HGB 11.7* 10.5*  HCT 38.1* 33.1*  MCV 75.0* 74.5*  PLT 243 192   Basic Metabolic Panel: Recent Labs  Lab 02/25/23 1739 02/25/23 2355 02/26/23 0512  NA 128* 129* 127*  K 3.9 4.4 4.1  CL 95* 96* 94*  CO2 19* 20* 20*  GLUCOSE 146* 256* 247*  BUN 24* 26* 26*  CREATININE 1.18 1.28* 1.26*  CALCIUM 8.9 8.6* 8.4*  MG 1.9  --  1.8   GFR: Estimated Creatinine Clearance: 51.8 mL/min (A) (by C-G formula based on SCr of 1.26 mg/dL (H)). Liver Function Tests: Recent Labs  Lab 02/25/23 2139  AST 26  ALT 20  ALKPHOS 70  BILITOT 1.1  PROT 7.4  ALBUMIN 4.3   No results for input(s): "LIPASE", "AMYLASE" in the last 168 hours. No results for input(s): "AMMONIA" in the last 168 hours. Coagulation Profile: Recent Labs  Lab 02/25/23 1739  INR 1.2   Cardiac Enzymes: No results for input(s): "CKTOTAL", "CKMB", "CKMBINDEX", "TROPONINI" in the last 168 hours. BNP (last 3 results) No results for input(s): "PROBNP" in the last 8760 hours. HbA1C: No results for input(s): "HGBA1C" in the last 72 hours. CBG: Recent Labs  Lab 02/26/23 0001 02/26/23 0734 02/26/23 1232  GLUCAP 252* 198* 243*   Lipid Profile: No results for input(s): "CHOL", "HDL", "LDLCALC", "TRIG", "CHOLHDL", "LDLDIRECT" in the last 72 hours. Thyroid Function Tests: No results for input(s): "TSH", "T4TOTAL", "FREET4",  "T3FREE", "THYROIDAB" in the last 72 hours. Anemia Panel: No results for input(s): "VITAMINB12", "FOLATE", "FERRITIN", "TIBC", "IRON", "RETICCTPCT" in the last 72 hours. Sepsis Labs: Recent Labs  Lab 02/25/23 1739 02/25/23 2209 02/25/23 2355  PROCALCITON 0.11  --   --   LATICACIDVEN  --  1.5 1.5    Recent Results (from the past 240 hours)  Resp panel by RT-PCR (RSV, Flu A&B, Covid) Anterior Nasal Swab     Status: None   Collection Time: 02/25/23 10:09 PM   Specimen: Anterior Nasal Swab  Result Value Ref Range Status   SARS Coronavirus 2 by RT PCR NEGATIVE NEGATIVE Final    Comment: (NOTE) SARS-CoV-2 target nucleic acids are NOT DETECTED.  The SARS-CoV-2 RNA is generally detectable in upper respiratory specimens during the acute phase of infection. The lowest concentration of SARS-CoV-2 viral copies this assay can detect is 138 copies/mL. A negative result does not preclude SARS-Cov-2 infection and should not be used as the sole basis for treatment or other patient management decisions. A negative result may occur with  improper specimen collection/handling, submission of specimen  other than nasopharyngeal swab, presence of viral mutation(s) within the areas targeted by this assay, and inadequate number of viral copies(<138 copies/mL). A negative result must be combined with clinical observations, patient history, and epidemiological information. The expected result is Negative.  Fact Sheet for Patients:  BloggerCourse.com  Fact Sheet for Healthcare Providers:  SeriousBroker.it  This test is no t yet approved or cleared by the [place] FDA and  has been authorized for detection and/or diagnosis of SARS-CoV-2 by FDA under an Emergency Use Authorization (EUA). This EUA will remain  in effect (meaning this test can be used) for the duration of the COVID-19 declaration under Section 564(b)(1) of the Act, 21 U.S.C.section  360bbb-3(b)(1), unless the authorization is terminated  or revoked sooner.       Influenza A by PCR NEGATIVE NEGATIVE Final   Influenza B by PCR NEGATIVE NEGATIVE Final    Comment: (NOTE) The Xpert Xpress SARS-CoV-2/FLU/RSV plus assay is intended as an aid in the diagnosis of influenza from Nasopharyngeal swab specimens and should not be used as a sole basis for treatment. Nasal washings and aspirates are unacceptable for Xpert Xpress SARS-CoV-2/FLU/RSV testing.  Fact Sheet for Patients: BloggerCourse.com  Fact Sheet for Healthcare Providers: SeriousBroker.it  This test is not yet approved or cleared by the [place] FDA and has been authorized for detection and/or diagnosis of SARS-CoV-2 by FDA under an Emergency Use Authorization (EUA). This EUA will remain in effect (meaning this test can be used) for the duration of the COVID-19 declaration under Section 564(b)(1) of the Act, 21 U.S.C. section 360bbb-3(b)(1), unless the authorization is terminated or revoked.     Resp Syncytial Virus by PCR NEGATIVE NEGATIVE Final    Comment: (NOTE) Fact Sheet for Patients: BloggerCourse.com  Fact Sheet for Healthcare Providers: SeriousBroker.it  This test is not yet approved or cleared by the [place] FDA and has been authorized for detection and/or diagnosis of SARS-CoV-2 by FDA under an Emergency Use Authorization (EUA). This EUA will remain in effect (meaning this test can be used) for the duration of the COVID-19 declaration under Section 564(b)(1) of the Act, 21 U.S.C. section 360bbb-3(b)(1), unless the authorization is terminated or revoked.  Performed at [facility], [address] [place], [place] [address]   Blood Culture (routine x 2)     Status: None (Preliminary result)   Collection Time: 02/25/23 10:10 PM   Specimen: BLOOD  Result Value Ref Range  Status   Specimen Description BLOOD LEFT ASSIST CONTROL  Final   Special Requests   Final    BOTTLES DRAWN AEROBIC AND ANAEROBIC Blood Culture adequate volume   Culture   Final    NO GROWTH < 12 HOURS Performed at [facility], [address] [place], [place] [address]    Report Status PENDING  Incomplete  Blood Culture (routine x 2)     Status: None (Preliminary result)   Collection Time: 02/25/23 10:10 PM   Specimen: BLOOD  Result Value Ref Range Status   Specimen Description BLOOD RIGHT FOREARM  Final   Special Requests   Final    BOTTLES DRAWN AEROBIC AND ANAEROBIC Blood Culture adequate volume   Culture   Final    NO GROWTH < 12 HOURS Performed at [facility], [address], [place], [place] [address]    Report Status PENDING  Incomplete         Radiology Studies: ECHOCARDIOGRAM COMPLETE Result Date: 02/26/2023    ECHOCARDIOGRAM REPORT  Patient Name:   Christopher Snow Date of Exam: 02/26/2023 Medical Rec #:  [id number]         Height:       75.0 in Accession #:    [id number]        Weight:       185.0 lb Date of Birth:  05/15/1940         BSA:          2.123 m Patient Age:    82 years          BP:           142/68 mmHg Patient Gender: M                 HR:           106 bpm. Exam Location:  [facility] Procedure: 2D Echo, Cardiac Doppler, Color Doppler and 3D Echo Indications:     CHF  History:         Patient has prior history of Echocardiogram examinations, most                  recent 08/27/2018. CHF, CAD and Previous Myocardial Infarction,                  Prior CABG and Abnormal ECG, PAD, Signs/Symptoms:Chest Pain;                  Risk Factors:Hypertension, Diabetes and Dyslipidemia.  Sonographer:     Mikki Harbor Referring Phys:  [id number] Brien Few NIU Diagnosing Phys: Debbe Odea MD IMPRESSIONS  1. Left ventricular ejection fraction, by estimation, is 30 to 35%. The left ventricle has moderate to severely decreased function. The left ventricle  demonstrates global hypokinesis. Left ventricular diastolic parameters are indeterminate.  2. Right ventricular systolic function is normal. The right ventricular size is mildly enlarged. There is mildly elevated pulmonary artery systolic pressure.  3. Left atrial size was mildly dilated.  4. The mitral valve is normal in structure. Mild mitral valve regurgitation.  5. Tricuspid valve regurgitation is mild to moderate.  6. The aortic valve has been repaired/replaced. Aortic valve regurgitation is not visualized.  7. The inferior vena cava is dilated in size with <50% respiratory variability, suggesting right atrial pressure of 15 mmHg. FINDINGS  Left Ventricle: Left ventricular ejection fraction, by estimation, is 30 to 35%. The left ventricle has moderate to severely decreased function. The left ventricle demonstrates global hypokinesis. The left ventricular internal cavity size was normal in size. There is no left ventricular hypertrophy. Left ventricular diastolic parameters are indeterminate. Right Ventricle: The right ventricular size is mildly enlarged. No increase in right ventricular wall thickness. Right ventricular systolic function is normal. There is mildly elevated pulmonary artery systolic pressure. The tricuspid regurgitant velocity is 2.67 m/s, and with an assumed right atrial pressure of 15 mmHg, the estimated right ventricular systolic pressure is 43.5 mmHg. Left Atrium: Left atrial size was mildly dilated. Right Atrium: Right atrial size was normal in size. Pericardium: There is no evidence of pericardial effusion. Mitral Valve: The mitral valve is normal in structure. Mild mitral valve regurgitation. MV peak gradient, 6.1 mmHg. The mean mitral valve gradient is 2.0 mmHg. Tricuspid Valve: The tricuspid valve is normal in structure. Tricuspid valve regurgitation is mild to moderate. Aortic Valve: The aortic valve has been repaired/replaced. Aortic valve regurgitation is not visualized. Aortic valve  mean gradient measures 2.0 mmHg. Aortic valve peak gradient measures 5.2 mmHg. Aortic valve area, by  VTI measures 2.67 cm. Pulmonic Valve: The pulmonic valve was normal in structure. Pulmonic valve regurgitation is mild. Aorta: The aortic root is normal in size and structure. Venous: The inferior vena cava is dilated in size with less than 50% respiratory variability, suggesting right atrial pressure of 15 mmHg. IAS/Shunts: No atrial level shunt detected by color flow Doppler.  LEFT VENTRICLE PLAX 2D LVIDd:         5.60 cm LVIDs:         4.90 cm LV PW:         1.20 cm LV IVS:        0.80 cm LVOT diam:     2.00 cm LV SV:         48 LV SV Index:   22 LVOT Area:     3.14 cm  LV Volumes (MOD) LV vol d, MOD A2C: 114.0 ml LV vol d, MOD A4C: 95.0 ml LV vol s, MOD A2C: 73.3 ml LV vol s, MOD A4C: 70.6 ml LV SV MOD A2C:     40.7 ml LV SV MOD A4C:     95.0 ml LV SV MOD BP:      32.1 ml RIGHT VENTRICLE RV Basal diam:  3.95 cm RV Mid diam:    4.20 cm RV S prime:     9.03 cm/s LEFT ATRIUM             Index        RIGHT ATRIUM           Index LA diam:        4.20 cm 1.98 cm/m   RA Area:     14.30 cm LA Vol (A2C):   62.2 ml 29.30 ml/m  RA Volume:   34.00 ml  16.02 ml/m LA Vol (A4C):   78.8 ml 37.12 ml/m LA Biplane Vol: 71.0 ml 33.45 ml/m  AORTIC VALVE                    PULMONIC VALVE AV Area (Vmax):    2.53 cm     PV Vmax:       0.87 m/s AV Area (Vmean):   2.63 cm     PV Peak grad:  3.0 mmHg AV Area (VTI):     2.67 cm AV Vmax:           114.00 cm/s AV Vmean:          68.200 cm/s AV VTI:            0.179 m AV Peak Grad:      5.2 mmHg AV Mean Grad:      2.0 mmHg LVOT Vmax:         91.80 cm/s LVOT Vmean:        57.200 cm/s LVOT VTI:          0.152 m LVOT/AV VTI ratio: 0.85  AORTA Ao Root diam: 3.70 cm MITRAL VALVE                TRICUSPID VALVE MV Area (PHT): 4.54 cm     TR Peak grad:   28.5 mmHg MV Area VTI:   2.45 cm     TR Vmax:        267.00 cm/s MV Peak grad:  6.1 mmHg MV Mean grad:  2.0 mmHg     SHUNTS MV Vmax:        1.23 m/s     Systemic VTI:  0.15 m MV  Vmean:      63.0 cm/s    Systemic Diam: 2.00 cm MV Decel Time: 167 msec MV E velocity: 109.00 cm/s Debbe Odea MD Electronically signed by Debbe Odea MD Signature Date/Time: 02/26/2023/1:02:11 PM    Final    DG Chest 2 View Result Date: 02/25/2023 CLINICAL DATA:  Shortness of breath and chest pain. EXAM: CHEST - 2 VIEW COMPARISON:  Chest radiograph dated 03/28/2020. FINDINGS: Small bilateral pleural effusions and bibasilar atelectasis or infiltrate. There is mild cardiomegaly with mild central vascular congestion. No pneumothorax. Median sternotomy wires and CABG vascular clips. No acute osseous pathology. IMPRESSION: 1. Small bilateral pleural effusions and bibasilar atelectasis or infiltrate. 2. Mild cardiomegaly with mild central vascular congestion. Electronically Signed   By: Elgie Collard M.D.   On: 02/25/2023 18:48        Scheduled Meds:  amLODipine  10 mg Oral Daily   aspirin EC  81 mg Oral Daily   atorvastatin  5 mg Oral q1800   carvedilol  3.125 mg Oral BID WC   enoxaparin (LOVENOX) injection  40 mg Subcutaneous Q24H   fluticasone  2 spray Each Nare Daily   furosemide  40 mg Intravenous Q12H   hydrALAZINE  50 mg Oral Q8H   insulin aspart  0-5 Units Subcutaneous QHS   insulin aspart  0-9 Units Subcutaneous TID WC   isosorbide mononitrate  30 mg Oral Daily   sodium chloride  1 g Oral BID WC   Continuous Infusions:  nitroGLYCERIN Stopped (02/26/23 1036)     LOS: 1 day     Tresa Moore, MD [facility]   If 7PM-7AM, please contact night-coverage  02/26/2023, 1:18 PM

## 2023-02-26 NOTE — Consult Note (Signed)
CARDIOLOGY CONSULT NOTE               Patient ID: Christopher Snow MRN: [id number] DOB/AGE: 1941/02/06 82 y.o.  Admit date: 02/25/2023 Referring Physician Dr Lolita Patella hospitalist Primary Physician Dr. Marcelino Duster primary Primary Cardiologist Dr. Darrold Junker Reason for Consultation non-STEMI heart failure elevated troponins  HPI: 82 year old male with ischemic systolic cardiomyopathy EF of 35 to 40% history of hypertension hyperlipidemia diabetes coronary disease CABG in the past peripheral vascular disease shortness of breath dyspnea previous myocardial infarction now with some chest pain possible angina elevated troponins.  Patient was admitted placed on telemetry because of chest pain left arm pain dry cough with shortness of breath placed on supplemental oxygen had some improvement with diuretic therapy elevated blood pressure above 200 placed on nitro drip in the ER and had significant improvement in symptoms now on the floor with improved chest pain still some shortness of breath.  Patient has history of dementia which makes history somewhat difficult  Review of systems complete and found to be negative unless listed above     Past Medical History:  Diagnosis Date   CHF (congestive heart failure) (HCC)    Diabetes mellitus without complication (HCC)    Heart attack (HCC)    Hyperlipidemia    Hypertension     Past Surgical History:  Procedure Laterality Date   APPENDECTOMY     CARDIAC SURGERY     five bypass   CORONARY ARTERY BYPASS GRAFT     HERNIA REPAIR     PR VEIN BYPASS GRAFT,AORTO-FEM-POP      Medications Prior to Admission  Medication Sig Dispense Refill Last Dose/Taking   albuterol (VENTOLIN HFA) 108 (90 Base) MCG/ACT inhaler 2 puffs q.i.d. p.r.n. short of breath, wheezing, or cough   Taking   amLODipine (NORVASC) 10 MG tablet Take 10 mg by mouth daily.   Past Week   aspirin EC 81 MG tablet Take 81 mg by mouth daily.   Past Week   atorvastatin  (LIPITOR) 10 MG tablet Take 10 mg by mouth daily at 6 PM.   Past Week   furosemide (LASIX) 40 MG tablet Take 1 tablet (40 mg total) by mouth daily. 30 tablet 1 Past Week   glipiZIDE (GLUCOTROL) 5 MG tablet Take 2 tablets by mouth daily.   Past Week   hydrALAZINE (APRESOLINE) 25 MG tablet Take 25 mg by mouth 3 (three) times daily.   Past Week   isosorbide mononitrate (IMDUR) 30 MG 24 hr tablet TAKE ONE TABLET BY MOUTH ONCE DAILY   Past Week   JARDIANCE 10 MG TABS tablet Take 1 tablet by mouth daily with breakfast.   Past Week   memantine (NAMENDA) 5 MG tablet Take 1 tablet by mouth 2 (two) times daily.   Past Week   metFORMIN (GLUCOPHAGE) 1000 MG tablet Take 1,000 mg by mouth 2 (two) times daily with a meal.   Past Week   sertraline (ZOLOFT) 50 MG tablet Take 1 tablet by mouth daily.   Past Week   carvedilol (COREG) 3.125 MG tablet Take 1 tablet (3.125 mg total) by mouth 2 (two) times daily with a meal. (Patient not taking: Reported on 02/26/2023) 30 tablet 1 Not Taking   Social History   Socioeconomic History   Marital status: Married    Spouse name: Not on file   Number of children: Not on file   Years of education: Not on file   Highest education level: Not on file  Occupational History   Occupation: retired  Tobacco Use   Smoking status: Former   Smokeless tobacco: Never  Substance and Sexual Activity   Alcohol use: No   Drug use: No   Sexual activity: Not on file  Other Topics Concern   Not on file  Social History Narrative   Not on file   Social Drivers of Health   Financial Resource Strain: Low Risk  (04/02/2020)   Received from [facility]   Overall Financial Resource Strain (CARDIA)    Difficulty of Paying Living Expenses: Not hard at all  Food Insecurity: No Food Insecurity (02/26/2023)   Hunger Vital Sign    Worried About Running Out of Food in the Last Year: Never true    Ran Out of Food in the Last Year: Never true  Transportation Needs: No Transportation  Needs (02/26/2023)   PRAPARE - Administrator, Civil Service (Medical): No    Lack of Transportation (Non-Medical): No  Physical Activity: Insufficiently Active (11/07/2018)   Exercise Vital Sign    Days of Exercise per Week: 3 days    Minutes of Exercise per Session: 40 min  Stress: No Stress Concern Present (11/07/2018)   [employer] of Occupational Health - Occupational Stress Questionnaire    Feeling of Stress : Not at all  Social Connections: Moderately Integrated (11/07/2018)   Social Connection and Isolation Panel [NHANES]    Frequency of Communication with Friends and Family: Three times a week    Frequency of Social Gatherings with Friends and Family: Twice a week    Attends Religious Services: 1 to 4 times per year    Active Member of [employer] or Organizations: No    Attends Banker Meetings: Never    Marital Status: Married  Catering manager Violence: Not At Risk (02/26/2023)   Humiliation, Afraid, Rape, and Kick questionnaire    Fear of Current or Ex-Partner: No    Emotionally Abused: No    Physically Abused: No    Sexually Abused: No    Family History  Problem Relation Age of Onset   Heart disease Mother    Heart disease Father    Diabetes Maternal Grandmother    Diabetes Maternal Grandfather       Review of systems complete and found to be negative unless listed above      PHYSICAL EXAM  General: Well developed, well nourished, in no acute distress HEENT:  Normocephalic and atramatic Neck:  No JVD.  Lungs: Clear bilaterally to auscultation and percussion. Heart: HRRR . Normal S1 and S2 without gallops or murmurs.  Abdomen: Bowel sounds are positive, abdomen soft and non-tender  Msk:  Back normal, normal gait. Normal strength and tone for age. Extremities: No clubbing, cyanosis or edema.   Neuro: Alert and oriented X 3. Psych:  Good affect, responds appropriately mild dementia slight confusion  Labs:   Lab Results  Component  Value Date   WBC 13.5 (H) 02/26/2023   HGB 10.5 (L) 02/26/2023   HCT 33.1 (L) 02/26/2023   MCV 74.5 (L) 02/26/2023   PLT 192 02/26/2023    Recent Labs  Lab 02/25/23 2139 02/25/23 2355 02/26/23 0512  NA  --    < > 127*  K  --    < > 4.1  CL  --    < > 94*  CO2  --    < > 20*  BUN  --    < > 26*  CREATININE  --    < >  1.26*  CALCIUM  --    < > 8.4*  PROT 7.4  --   --   BILITOT 1.1  --   --   ALKPHOS 70  --   --   ALT 20  --   --   AST 26  --   --   GLUCOSE  --    < > 247*   < > = values in this interval not displayed.   Lab Results  Component Value Date   TROPONINI 0.04 (HH) 08/27/2018   No results found for: "CHOL" No results found for: "HDL" No results found for: "LDLCALC" No results found for: "TRIG" No results found for: "CHOLHDL" No results found for: "LDLDIRECT"    Radiology: [facility] Chest Port 1 View Result Date: 02/26/2023 CLINICAL DATA:  Hypoxia. EXAM: PORTABLE CHEST 1 VIEW COMPARISON:  02/25/2023. FINDINGS: There is diffuse pulmonary vascular congestion, slightly more pronounced than the prior exam. Redemonstration of left retrocardiac airspace opacity obscuring the left hemidiaphragm, descending thoracic aorta and blunting the left lateral costophrenic angle suggesting combination of left lower lobe atelectasis and/or consolidation with pleural effusion. Right lateral costophrenic angle is clear. Stable cardio-mediastinal silhouette. There are surgical staples along the heart border and sternotomy wires, status post CABG (coronary artery bypass graft). No acute osseous abnormalities. The soft tissues are within normal limits. IMPRESSION: *Findings favor worsening congestive heart failure/pulmonary edema. *Persistent left retrocardiac opacity, as described above. Electronically Signed   By: Jules Schick M.D.   On: 02/26/2023 18:06   ECHOCARDIOGRAM COMPLETE Result Date: 02/26/2023    ECHOCARDIOGRAM REPORT   Patient Name:   PARKER DELACERDA Date of Exam: 02/26/2023  Medical Rec #:  [id number]         Height:       75.0 in Accession #:    [id number]        Weight:       185.0 lb Date of Birth:  [birth year]         BSA:          2.123 m Patient Age:    82 years          BP:           142/68 mmHg Patient Gender: M                 HR:           106 bpm. Exam Location:  [facility] Procedure: 2D Echo, Cardiac Doppler, Color Doppler and 3D Echo Indications:     CHF  History:         Patient has prior history of Echocardiogram examinations, most                  recent 08/27/2018. CHF, CAD and Previous Myocardial Infarction,                  Prior CABG and Abnormal ECG, PAD, Signs/Symptoms:Chest Pain;                  Risk Factors:Hypertension, Diabetes and Dyslipidemia.  Sonographer:     Mikki Harbor Referring Phys:  [id number] Brien Few NIU Diagnosing Phys: Debbe Odea MD IMPRESSIONS  1. Left ventricular ejection fraction, by estimation, is 30 to 35%. The left ventricle has moderate to severely decreased function. The left ventricle demonstrates global hypokinesis. Left ventricular diastolic parameters are indeterminate.  2. Right ventricular systolic function is normal. The right ventricular size is mildly enlarged. There is mildly elevated pulmonary artery  systolic pressure.  3. Left atrial size was mildly dilated.  4. The mitral valve is normal in structure. Mild mitral valve regurgitation.  5. Tricuspid valve regurgitation is mild to moderate.  6. The aortic valve has been repaired/replaced. Aortic valve regurgitation is not visualized.  7. The inferior vena cava is dilated in size with <50% respiratory variability, suggesting right atrial pressure of 15 mmHg. FINDINGS  Left Ventricle: Left ventricular ejection fraction, by estimation, is 30 to 35%. The left ventricle has moderate to severely decreased function. The left ventricle demonstrates global hypokinesis. The left ventricular internal cavity size was normal in size. There is no left ventricular hypertrophy. Left ventricular  diastolic parameters are indeterminate. Right Ventricle: The right ventricular size is mildly enlarged. No increase in right ventricular wall thickness. Right ventricular systolic function is normal. There is mildly elevated pulmonary artery systolic pressure. The tricuspid regurgitant velocity is 2.67 m/s, and with an assumed right atrial pressure of 15 mmHg, the estimated right ventricular systolic pressure is 43.5 mmHg. Left Atrium: Left atrial size was mildly dilated. Right Atrium: Right atrial size was normal in size. Pericardium: There is no evidence of pericardial effusion. Mitral Valve: The mitral valve is normal in structure. Mild mitral valve regurgitation. MV peak gradient, 6.1 mmHg. The mean mitral valve gradient is 2.0 mmHg. Tricuspid Valve: The tricuspid valve is normal in structure. Tricuspid valve regurgitation is mild to moderate. Aortic Valve: The aortic valve has been repaired/replaced. Aortic valve regurgitation is not visualized. Aortic valve mean gradient measures 2.0 mmHg. Aortic valve peak gradient measures 5.2 mmHg. Aortic valve area, by VTI measures 2.67 cm. Pulmonic Valve: The pulmonic valve was normal in structure. Pulmonic valve regurgitation is mild. Aorta: The aortic root is normal in size and structure. Venous: The inferior vena cava is dilated in size with less than 50% respiratory variability, suggesting right atrial pressure of 15 mmHg. IAS/Shunts: No atrial level shunt detected by color flow Doppler.  LEFT VENTRICLE PLAX 2D LVIDd:         5.60 cm LVIDs:         4.90 cm LV PW:         1.20 cm LV IVS:        0.80 cm LVOT diam:     2.00 cm LV SV:         48 LV SV Index:   22 LVOT Area:     3.14 cm  LV Volumes (MOD) LV vol d, MOD A2C: 114.0 ml LV vol d, MOD A4C: 95.0 ml LV vol s, MOD A2C: 73.3 ml LV vol s, MOD A4C: 70.6 ml LV SV MOD A2C:     40.7 ml LV SV MOD A4C:     95.0 ml LV SV MOD BP:      32.1 ml RIGHT VENTRICLE RV Basal diam:  3.95 cm RV Mid diam:    4.20 cm RV S prime:      9.03 cm/s LEFT ATRIUM             Index        RIGHT ATRIUM           Index LA diam:        4.20 cm 1.98 cm/m   RA Area:     14.30 cm LA Vol (A2C):   62.2 ml 29.30 ml/m  RA Volume:   34.00 ml  16.02 ml/m LA Vol (A4C):   78.8 ml 37.12 ml/m LA Biplane Vol: 71.0 ml 33.45 ml/m  AORTIC VALVE                    PULMONIC VALVE AV Area (Vmax):    2.53 cm     PV Vmax:       0.87 m/s AV Area (Vmean):   2.63 cm     PV Peak grad:  3.0 mmHg AV Area (VTI):     2.67 cm AV Vmax:           114.00 cm/s AV Vmean:          68.200 cm/s AV VTI:            0.179 m AV Peak Grad:      5.2 mmHg AV Mean Grad:      2.0 mmHg LVOT Vmax:         91.80 cm/s LVOT Vmean:        57.200 cm/s LVOT VTI:          0.152 m LVOT/AV VTI ratio: 0.85  AORTA Ao Root diam: 3.70 cm MITRAL VALVE                TRICUSPID VALVE MV Area (PHT): 4.54 cm     TR Peak grad:   28.5 mmHg MV Area VTI:   2.45 cm     TR Vmax:        267.00 cm/s MV Peak grad:  6.1 mmHg MV Mean grad:  2.0 mmHg     SHUNTS MV Vmax:       1.23 m/s     Systemic VTI:  0.15 m MV Vmean:      63.0 cm/s    Systemic Diam: 2.00 cm MV Decel Time: 167 msec MV E velocity: 109.00 cm/s Debbe Odea MD Electronically signed by Debbe Odea MD Signature Date/Time: 02/26/2023/1:02:11 PM    Final    DG Chest 2 View Result Date: 02/25/2023 CLINICAL DATA:  Shortness of breath and chest pain. EXAM: CHEST - 2 VIEW COMPARISON:  Chest radiograph dated 03/28/2020. FINDINGS: Small bilateral pleural effusions and bibasilar atelectasis or infiltrate. There is mild cardiomegaly with mild central vascular congestion. No pneumothorax. Median sternotomy wires and CABG vascular clips. No acute osseous pathology. IMPRESSION: 1. Small bilateral pleural effusions and bibasilar atelectasis or infiltrate. 2. Mild cardiomegaly with mild central vascular congestion. Electronically Signed   By: Elgie Collard M.D.   On: 02/25/2023 18:48    EKG: Sinus tach right bundle branch block nonspecific ST-T wave  changes  ASSESSMENT AND PLAN:  Hypoxemia Shortness of breath Systolic congestive heart failure Systolic cardiomyopathy Multivessel coronary disease History of coronary bypass surgery History of MI NSTEMI Hypoxemia Diabetes Dementia . Plan Agree to admit to telemetry Follow-up troponins EKGs telemetry lab work Consider echocardiogram for evaluation of left ventricular function wall motion Supplemental oxygen as necessary for hypoxemia Agree with broad-spectrum antibiotic therapy for possible bronchitis pneumonia Elevated troponins concerning for possible non-STEMI continue to follow troponin trends continue IV heparin consider cardiac cath prior to discharge Continue diabetes management and control Will institute Entresto beta-blocker spironolactone for ischemic cardiomyopathy Inhalers as necessary for shortness of breath dyspnea Continue diabetes management and control Maintain Lipitor therapy for lipid management Imdur beta-blocker aspirin statin for chest pain and angina    Signed: Alwyn Pea MD, 02/26/2023, 10:00 PM

## 2023-02-26 NOTE — ED Notes (Signed)
Called pharmacy to tube Liptor med.

## 2023-02-26 NOTE — Consult Note (Signed)
Pharmacy Consult Note - Anticoagulation  Pharmacy Consult for heparin Indication: chest pain/ACS  PATIENT MEASUREMENTS: Height: 6\' 3"  (190.5 cm) Weight: 83.9 kg (185 lb) IBW/kg (Calculated) : 84.5 HEPARIN DW (KG): 83.9  VITAL SIGNS: Temp: 98.6 F (37 C) (12/13 1336) Temp Source: Oral (12/13 1336) BP: 164/78 (12/13 1649) Pulse Rate: 113 (12/13 1649)  Recent Labs    02/25/23 1739 02/25/23 2139 02/26/23 0512 02/26/23 0829 02/26/23 1526  HGB 11.7*  --  10.5*  --   --   HCT 38.1*  --  33.1*  --   --   PLT 243  --  192  --   --   LABPROT 14.9  --   --   --   --   INR 1.2  --   --   --   --   CREATININE 1.18   < > 1.26*  --   --   TROPONINIHS 52*   < > 143*   < > 1,404*   < > = values in this interval not displayed.    Estimated Creatinine Clearance: 51.8 mL/min (A) (by C-G formula based on SCr of 1.26 mg/dL (H)).  PAST MEDICAL HISTORY: Past Medical History:  Diagnosis Date   CHF (congestive heart failure) (HCC)    Diabetes mellitus without complication (HCC)    Heart attack (HCC)    Hyperlipidemia    Hypertension     ASSESSMENT: 82 y.o. male with PMH CHF, HTN, HLD is presenting with chest pain. cTn trending up. Patient is not on chronic anticoagulation per chart review. Pharmacy has been consulted to initiate and manage heparin intravenous infusion.  Pertinent medications: No chronic AC prior to admission per chart review  Goal(s) of therapy: Heparin level 0.3 - 0.7 units/mL Monitor platelets by anticoagulation protocol: Yes   Baseline anticoagulation labs: Recent Labs    02/25/23 1739 02/26/23 0512  INR 1.2  --   HGB 11.7* 10.5*  PLT 243 192    Date Time aPTT/HL Rate/Comment     PLAN: Discontinue enoxaparin DVT prophylaxis Give 4000 units bolus x1; then start heparin infusion at 1000 units/hour. Check heparin level in 8 hours, then daily once at least two levels are consecutively therapeutic. Monitor CBC daily while on heparin infusion.  Will M.  Dareen Piano, PharmD Clinical Pharmacist 02/26/2023 5:13 PM

## 2023-02-26 NOTE — ED Notes (Signed)
Daughter at bedside.

## 2023-02-26 NOTE — ED Notes (Addendum)
Pt in NAD. Daughter at bedside. Pt is calm and states chest pain is gone and arm pain is gone.

## 2023-02-26 NOTE — Progress Notes (Signed)
Heart Failure Navigator Progress Note  Assessed for Heart & Vascular TOC clinic readiness.  Does not meet criteria due to [facility] patient.   Navigator will sign off at this time.  Roxy Horseman, RN, BSN [facility] Heart Failure Navigator Secure Chat Only

## 2023-02-26 NOTE — ED Notes (Signed)
Patient has remained restless, and has been sliding out of the ED stretcher.  He has not been able to become comfortable.  He has been switched to a hospital bed.

## 2023-02-26 NOTE — ED Notes (Signed)
Attending at bedside.

## 2023-02-27 DIAGNOSIS — I5023 Acute on chronic systolic (congestive) heart failure: Secondary | ICD-10-CM | POA: Diagnosis not present

## 2023-02-27 LAB — BASIC METABOLIC PANEL
Anion gap: 13 (ref 5–15)
BUN: 28 mg/dL — ABNORMAL HIGH (ref 8–23)
CO2: 22 mmol/L (ref 22–32)
Calcium: 8.1 mg/dL — ABNORMAL LOW (ref 8.9–10.3)
Chloride: 93 mmol/L — ABNORMAL LOW (ref 98–111)
Creatinine, Ser: 1.45 mg/dL — ABNORMAL HIGH (ref 0.61–1.24)
GFR, Estimated: 48 mL/min — ABNORMAL LOW (ref >60)
Glucose, Bld: 154 mg/dL — ABNORMAL HIGH (ref 70–99)
Potassium: 3.5 mmol/L (ref 3.5–5.1)
Sodium: 128 mmol/L — ABNORMAL LOW (ref 135–145)

## 2023-02-27 LAB — CBC WITH DIFFERENTIAL/PLATELET
Abs Immature Granulocytes: 0.05 10*3/uL (ref 0.00–0.07)
Basophils Absolute: 0 10*3/uL (ref 0.0–0.1)
Basophils Relative: 0 %
Eosinophils Absolute: 0.1 10*3/uL (ref 0.0–0.5)
Eosinophils Relative: 1 %
HCT: 33.4 % — ABNORMAL LOW (ref 39.0–52.0)
Hemoglobin: 10.7 g/dL — ABNORMAL LOW (ref 13.0–17.0)
Immature Granulocytes: 0 %
Lymphocytes Relative: 10 %
Lymphs Abs: 1.2 10*3/uL (ref 0.7–4.0)
MCH: 23.4 pg — ABNORMAL LOW (ref 26.0–34.0)
MCHC: 32 g/dL (ref 30.0–36.0)
MCV: 72.9 fL — ABNORMAL LOW (ref 80.0–100.0)
Monocytes Absolute: 1.5 10*3/uL — ABNORMAL HIGH (ref 0.1–1.0)
Monocytes Relative: 12 %
Neutro Abs: 10.1 10*3/uL — ABNORMAL HIGH (ref 1.7–7.7)
Neutrophils Relative %: 77 %
Platelets: 195 10*3/uL (ref 150–400)
RBC: 4.58 MIL/uL (ref 4.22–5.81)
RDW: 18.6 % — ABNORMAL HIGH (ref 11.5–15.5)
WBC: 13 10*3/uL — ABNORMAL HIGH (ref 4.0–10.5)
nRBC: 0 % (ref 0.0–0.2)

## 2023-02-27 LAB — MAGNESIUM: Magnesium: 2.1 mg/dL (ref 1.7–2.4)

## 2023-02-27 LAB — GLUCOSE, CAPILLARY
Glucose-Capillary: 150 mg/dL — ABNORMAL HIGH (ref 70–99)
Glucose-Capillary: 235 mg/dL — ABNORMAL HIGH (ref 70–99)
Glucose-Capillary: 141 mg/dL — ABNORMAL HIGH (ref 70–99)
Glucose-Capillary: 293 mg/dL — ABNORMAL HIGH (ref 70–99)

## 2023-02-27 LAB — TROPONIN I (HIGH SENSITIVITY)
Troponin I (High Sensitivity): 5353 ng/L (ref <18)
Troponin I (High Sensitivity): 5398 ng/L (ref <18)
Troponin I (High Sensitivity): 5326 ng/L (ref <18)

## 2023-02-27 LAB — HEPARIN LEVEL (UNFRACTIONATED)
Heparin Unfractionated: 0.46 [IU]/mL (ref 0.30–0.70)
Heparin Unfractionated: 0.28 [IU]/mL — ABNORMAL LOW (ref 0.30–0.70)
Heparin Unfractionated: 0.31 [IU]/mL (ref 0.30–0.70)

## 2023-02-27 MED ORDER — SPIRONOLACTONE 12.5 MG HALF TABLET
12.5000 mg | ORAL_TABLET | Freq: Every day | ORAL | Status: DC
  Administered 2023-02-27 – 2023-03-03 (×5): 12.5 mg via ORAL
  Filled 2023-02-27 (×5): qty 1

## 2023-02-27 MED ORDER — ISOSORBIDE MONONITRATE ER 60 MG PO TB24
60.0000 mg | ORAL_TABLET | Freq: Every day | ORAL | Status: DC
  Administered 2023-02-28 – 2023-03-03 (×4): 60 mg via ORAL
  Filled 2023-02-27 (×4): qty 1

## 2023-02-27 MED ORDER — HEPARIN BOLUS VIA INFUSION
1250.0000 [IU] | Freq: Once | INTRAVENOUS | Status: AC
  Administered 2023-02-27: 1250 [IU] via INTRAVENOUS
  Filled 2023-02-27: qty 1250

## 2023-02-27 MED ORDER — LOSARTAN POTASSIUM 25 MG PO TABS
25.0000 mg | ORAL_TABLET | Freq: Every day | ORAL | Status: DC
  Administered 2023-02-27 – 2023-03-03 (×5): 25 mg via ORAL
  Filled 2023-02-27 (×5): qty 1

## 2023-02-27 MED ORDER — CARVEDILOL 12.5 MG PO TABS
12.5000 mg | ORAL_TABLET | Freq: Two times a day (BID) | ORAL | Status: DC
Start: 2023-02-28 — End: 2023-03-01
  Administered 2023-02-28 – 2023-03-01 (×3): 12.5 mg via ORAL
  Filled 2023-02-27 (×3): qty 1

## 2023-02-27 MED ORDER — FUROSEMIDE 10 MG/ML IJ SOLN
40.0000 mg | Freq: Two times a day (BID) | INTRAMUSCULAR | Status: DC
  Administered 2023-02-27 – 2023-02-28 (×4): 40 mg via INTRAVENOUS
  Filled 2023-02-27 (×4): qty 4

## 2023-02-27 NOTE — Consult Note (Signed)
Pharmacy Consult Note - Anticoagulation  Pharmacy Consult for heparin Indication: chest pain/ACS  PATIENT MEASUREMENTS: Height: 6\' 3"  (190.5 cm) Weight: 83.9 kg (185 lb) IBW/kg (Calculated) : 84.5 HEPARIN DW (KG): 83.9  VITAL SIGNS: Temp: 98.7 F (37.1 C) (12/14 0823) Temp Source: Oral (12/14 0344) BP: 152/62 (12/14 0823) Pulse Rate: 60 (12/14 0823)  Recent Labs    02/26/23 1700 02/26/23 1929 02/27/23 0152 02/27/23 0846 02/27/23 1020  HGB  --   --  10.7*  --   --   HCT  --   --  33.4*  --   --   PLT  --   --  195  --   --   APTT 38*  --   --   --   --   LABPROT 15.9*  --   --   --   --   INR 1.3*  --   --   --   --   HEPARINUNFRC  --    < > 0.46  --  0.28*  CREATININE  --   --  1.45*  --   --   TROPONINIHS 1,715*   < >  --  5,353*  --    < > = values in this interval not displayed.    Estimated Creatinine Clearance: 45 mL/min (A) (by C-G formula based on SCr of 1.45 mg/dL (H)).  PAST MEDICAL HISTORY: Past Medical History:  Diagnosis Date   CHF (congestive heart failure) (HCC)    Diabetes mellitus without complication (HCC)    Heart attack (HCC)    Hyperlipidemia    Hypertension     ASSESSMENT: 82 y.o. male with PMH CHF, HTN, HLD is presenting with chest pain. cTn trending up. Patient is not on chronic anticoagulation per chart review. Pharmacy has been consulted to initiate and manage heparin intravenous infusion.  Pertinent medications: No chronic AC prior to admission per chart review  Goal(s) of therapy: Heparin level 0.3 - 0.7 units/mL Monitor platelets by anticoagulation protocol: Yes   Baseline anticoagulation labs: Recent Labs    02/25/23 1739 02/26/23 0512 02/26/23 1700 02/27/23 0152  APTT  --   --  38*  --   INR 1.2  --  1.3*  --   HGB 11.7* 10.5*  --  10.7*  PLT 243 192  --  195    Date Time aPTT/HL Rate/Comment 12/14 0152 HL 0.46 Therapeutic x 1  12/14 1020 HL 0.28  Subtherapeutic   PLAN: Heparin level is slightly subtherapeutic.  Will give heparin bolus of 1250 units x 1 and increase heparin infusion to 1150 units/hr. Recheck heparin level in 8 hours. CBC daily while on heparin.   Paschal Dopp, PharmD, BCPS 02/27/2023 11:02 AM

## 2023-02-27 NOTE — Progress Notes (Signed)
PROGRESS NOTE    Christopher Snow  HYQ:[id number] DOB: 05-09-1940 DOA: 02/25/2023 PCP: Gracelyn Nurse, MD    Brief Narrative:  82 y.o. male with medical history significant of sCHF with EF 35-405,  HTN, HLD, DM, CAD, CBAG, PVD, dementia, who presents with chest pain.   Per his daughter at bedside, pt started having chest pain since afternoon.  The chest pain is located in left side of chest, radiating to the left arm.  Patient cannot characterize his pain in detail due to dementia.  Patient has mild dry cough.  Patient does not have significant shortness breath, but was found to have oxygen desaturation to 80s on room air in ED, which improved to 92-96% on 4 L of oxygen.  Patient is normally not using oxygen at home.  Patient blood pressure is significantly elevated with SBP 200s, nitroglycerin drip is started in ED.    Assessment & Plan:   Principal Problem:   Acute on chronic systolic CHF (congestive heart failure) (HCC) Active Problems:   Acute respiratory failure with hypoxia (HCC)   Hypertensive emergency   CAD (coronary artery disease)   Myocardial injury   HLD (hyperlipidemia)   Diabetes mellitus with peripheral circulatory disorder (HCC)   Hyponatremia   Leukocytosis   Dementia without behavioral disturbance (HCC)  Acute respiratory failure with hypoxia due to acute on chronic systolic CHF (congestive heart failure) (HCC):  Patient is 4 L of new oxygen requirement.  2D echo on 08/27/2018 showed EF of 35-40%.  Patient does not have leg edema, but has elevated BNP 822, positive JVD, crackles on auscultation, vascular congestion on chest x-ray, clinically consistent with CHF exacerbation.  Initially required only 4 L however oxygen requirements rapidly escalated on 12/13 and patient was placed on heated high flow nasal cannula.  2D echocardiogram with EF 30 to 35%, previous in 20 2035 to 40%. Plan: Continue oral antihypertensive regimen Lasix 40 mg IV twice daily (received 140  mg total on 12/13) Strict and outs, daily weights, fluid restrict Cardiology, [facility] has been consulted.  Recommendations appreciated and pending.    Hypertensive emergency:  Initially noted systolic blood pressure in the 200s Plan: Wean nitroglycerin drip Amlodipine 10 mg daily Hydralazine 50 mg 3 times daily Imdur Coreg Daughter states patient no longer on Entresto.  Will stop for now pending further cardiology recommendations    CAD (coronary artery disease) Elevated troponins Troponins continue to uptrend at time of this note.  Last troponin 5398.  Appears to be plateauing.  Suspect supply/demand ischemia in the setting of decompensated heart failure and hypertensive urgency but unable to exclude NSTEMI at this time Plan: Cardiac telemetry Trend troponins to peak Aspirin Statin Cardiology consulted  HLD (hyperlipidemia) -Lipitor   Diabetes mellitus with peripheral circulatory disorder [facility]):  Patient is taking metformin, Jardiance and glipizide -Sliding scale insulin   Hyponatremia:  Suspect hypervolemic hyponatremia Plan: Diuresis as above Careful monitoring of serum sodium   Leukocytosis:  WBC 16.2.  No fever, procalcitonin 0.11 No clinical evidence of infection Plan: Hold antibiotics Follow blood cultures Monitor vitals and fever curve   Dementia without behavioral disturbance (HCC)  Mental status at baseline -Fall precaution  DVT prophylaxis: Lovenox Code Status: Full Family Communication: Daughter at bedside 12/13, 12/14 Disposition Plan: Status is: Inpatient Remains inpatient appropriate because: Acute on chronic systolic congestive heart failure, hypertensive urgency, elevated troponins   Level of care: Progressive  Consultants:  None  Procedures:  None  Antimicrobials: None   Subjective:  Seen and examined.  Resting in bed.  Daughter at bedside.  Answers all questions appropriately.  No conversational dyspnea  noted.  Objective: Vitals:   02/27/23 0823 02/27/23 0900 02/27/23 1000 02/27/23 1139  BP: (!) 152/62   128/63  Pulse: 60   81  Resp: 17 18 18 17   Temp: 98.7 F (37.1 C)   98.4 F (36.9 C)  TempSrc:      SpO2: 93%   94%  Weight:      Height:        Intake/Output Summary (Last 24 hours) at 02/27/2023 1241 Last data filed at 02/27/2023 0420 Gross per 24 hour  Intake --  Output 1750 ml  Net -1750 ml   Filed Weights   02/25/23 1736  Weight: 83.9 kg    Examination:  General exam: NAD Respiratory system: Crackles.  Improved air entry.  Normal work of breathing.  Heated high flow Cardiovascular system: S1-S2, RRR, no murmurs, no pedal edema Gastrointestinal system: Soft, NT/ND, normal bowel sounds Central nervous system: Alert and oriented. No focal neurological deficits. Extremities: Symmetric 5 x 5 power. Skin: No rashes, lesions or ulcers Psychiatry: Judgement and insight appear normal. Mood & affect appropriate.     Data Reviewed: I have personally reviewed following labs and imaging studies  CBC: Recent Labs  Lab 02/25/23 1739 02/26/23 0512 02/27/23 0152  WBC 16.2* 13.5* 13.0*  NEUTROABS  --   --  10.1*  HGB 11.7* 10.5* 10.7*  HCT 38.1* 33.1* 33.4*  MCV 75.0* 74.5* 72.9*  PLT 243 192 195   Basic Metabolic Panel: Recent Labs  Lab 02/25/23 1739 02/25/23 2355 02/26/23 0512 02/27/23 0152  NA 128* 129* 127* 128*  K 3.9 4.4 4.1 3.5  CL 95* 96* 94* 93*  CO2 19* 20* 20* 22  GLUCOSE 146* 256* 247* 154*  BUN 24* 26* 26* 28*  CREATININE 1.18 1.28* 1.26* 1.45*  CALCIUM 8.9 8.6* 8.4* 8.1*  MG 1.9  --  1.8 2.1   GFR: Estimated Creatinine Clearance: 45 mL/min (A) (by C-G formula based on SCr of 1.45 mg/dL (H)). Liver Function Tests: Recent Labs  Lab 02/25/23 2139  AST 26  ALT 20  ALKPHOS 70  BILITOT 1.1  PROT 7.4  ALBUMIN 4.3   No results for input(s): "LIPASE", "AMYLASE" in the last 168 hours. No results for input(s): "AMMONIA" in the last 168  hours. Coagulation Profile: Recent Labs  Lab 02/25/23 1739 02/26/23 1700  INR 1.2 1.3*   Cardiac Enzymes: No results for input(s): "CKTOTAL", "CKMB", "CKMBINDEX", "TROPONINI" in the last 168 hours. BNP (last 3 results) No results for input(s): "PROBNP" in the last 8760 hours. HbA1C: No results for input(s): "HGBA1C" in the last 72 hours. CBG: Recent Labs  Lab 02/26/23 0734 02/26/23 1232 02/26/23 1648 02/27/23 0824 02/27/23 1141  GLUCAP 198* 243* 177* 150* 235*   Lipid Profile: No results for input(s): "CHOL", "HDL", "LDLCALC", "TRIG", "CHOLHDL", "LDLDIRECT" in the last 72 hours. Thyroid Function Tests: No results for input(s): "TSH", "T4TOTAL", "FREET4", "T3FREE", "THYROIDAB" in the last 72 hours. Anemia Panel: No results for input(s): "VITAMINB12", "FOLATE", "FERRITIN", "TIBC", "IRON", "RETICCTPCT" in the last 72 hours. Sepsis Labs: Recent Labs  Lab 02/25/23 1739 02/25/23 2209 02/25/23 2355  PROCALCITON 0.11  --   --   LATICACIDVEN  --  1.5 1.5    Recent Results (from the past 240 hours)  Resp panel by RT-PCR (RSV, Flu A&B, Covid) Anterior Nasal Swab     Status: None   Collection  Time: 02/25/23 10:09 PM   Specimen: Anterior Nasal Swab  Result Value Ref Range Status   SARS Coronavirus 2 by RT PCR NEGATIVE NEGATIVE Final    Comment: (NOTE) SARS-CoV-2 target nucleic acids are NOT DETECTED.  The SARS-CoV-2 RNA is generally detectable in upper respiratory specimens during the acute phase of infection. The lowest concentration of SARS-CoV-2 viral copies this assay can detect is 138 copies/mL. A negative result does not preclude SARS-Cov-2 infection and should not be used as the sole basis for treatment or other patient management decisions. A negative result may occur with  improper specimen collection/handling, submission of specimen other than nasopharyngeal swab, presence of viral mutation(s) within the areas targeted by this assay, and inadequate number of  viral copies(<138 copies/mL). A negative result must be combined with clinical observations, patient history, and epidemiological information. The expected result is Negative.  Fact Sheet for Patients:  BloggerCourse.com  Fact Sheet for Healthcare Providers:  SeriousBroker.it  This test is no t yet approved or cleared by the [place] FDA and  has been authorized for detection and/or diagnosis of SARS-CoV-2 by FDA under an Emergency Use Authorization (EUA). This EUA will remain  in effect (meaning this test can be used) for the duration of the COVID-19 declaration under Section 564(b)(1) of the Act, 21 U.S.C.section 360bbb-3(b)(1), unless the authorization is terminated  or revoked sooner.       Influenza A by PCR NEGATIVE NEGATIVE Final   Influenza B by PCR NEGATIVE NEGATIVE Final    Comment: (NOTE) The Xpert Xpress SARS-CoV-2/FLU/RSV plus assay is intended as an aid in the diagnosis of influenza from Nasopharyngeal swab specimens and should not be used as a sole basis for treatment. Nasal washings and aspirates are unacceptable for Xpert Xpress SARS-CoV-2/FLU/RSV testing.  Fact Sheet for Patients: BloggerCourse.com  Fact Sheet for Healthcare Providers: SeriousBroker.it  This test is not yet approved or cleared by the [place] FDA and has been authorized for detection and/or diagnosis of SARS-CoV-2 by FDA under an Emergency Use Authorization (EUA). This EUA will remain in effect (meaning this test can be used) for the duration of the COVID-19 declaration under Section 564(b)(1) of the Act, 21 U.S.C. section 360bbb-3(b)(1), unless the authorization is terminated or revoked.     Resp Syncytial Virus by PCR NEGATIVE NEGATIVE Final    Comment: (NOTE) Fact Sheet for Patients: BloggerCourse.com  Fact Sheet for Healthcare  Providers: SeriousBroker.it  This test is not yet approved or cleared by the [place] FDA and has been authorized for detection and/or diagnosis of SARS-CoV-2 by FDA under an Emergency Use Authorization (EUA). This EUA will remain in effect (meaning this test can be used) for the duration of the COVID-19 declaration under Section 564(b)(1) of the Act, 21 U.S.C. section 360bbb-3(b)(1), unless the authorization is terminated or revoked.  Performed at [facility], [address] [place], [place] [address]   Blood Culture (routine x 2)     Status: None (Preliminary result)   Collection Time: 02/25/23 10:10 PM   Specimen: BLOOD  Result Value Ref Range Status   Specimen Description BLOOD LEFT ASSIST CONTROL  Final   Special Requests   Final    BOTTLES DRAWN AEROBIC AND ANAEROBIC Blood Culture adequate volume   Culture   Final    NO GROWTH 2 DAYS Performed at [facility], [address] [place], [place] [address]    Report Status PENDING  Incomplete  Blood Culture (routine x 2)  Status: None (Preliminary result)   Collection Time: 02/25/23 10:10 PM   Specimen: BLOOD  Result Value Ref Range Status   Specimen Description BLOOD RIGHT FOREARM  Final   Special Requests   Final    BOTTLES DRAWN AEROBIC AND ANAEROBIC Blood Culture adequate volume   Culture   Final    NO GROWTH 2 DAYS Performed at [facility], [address], [place], [place] [address]    Report Status PENDING  Incomplete         Radiology Studies: DG Chest Port 1 View Result Date: 02/26/2023 CLINICAL DATA:  Hypoxia. EXAM: PORTABLE CHEST 1 VIEW COMPARISON:  02/25/2023. FINDINGS: There is diffuse pulmonary vascular congestion, slightly more pronounced than the prior exam. Redemonstration of left retrocardiac airspace opacity obscuring the left hemidiaphragm, descending thoracic aorta and blunting the left lateral costophrenic angle suggesting  combination of left lower lobe atelectasis and/or consolidation with pleural effusion. Right lateral costophrenic angle is clear. Stable cardio-mediastinal silhouette. There are surgical staples along the heart border and sternotomy wires, status post CABG (coronary artery bypass graft). No acute osseous abnormalities. The soft tissues are within normal limits. IMPRESSION: *Findings favor worsening congestive heart failure/pulmonary edema. *Persistent left retrocardiac opacity, as described above. Electronically Signed   By: Jules Schick M.D.   On: 02/26/2023 18:06   ECHOCARDIOGRAM COMPLETE Result Date: 02/26/2023    ECHOCARDIOGRAM REPORT   Patient Name:   AJEE CUMPSTON Date of Exam: 02/26/2023 Medical Rec #:  [id number]         Height:       75.0 in Accession #:    [id number]        Weight:       185.0 lb Date of Birth:  Dec 28, 1940         BSA:          2.123 m Patient Age:    82 years          BP:           142/68 mmHg Patient Gender: M                 HR:           106 bpm. Exam Location:  [facility] Procedure: 2D Echo, Cardiac Doppler, Color Doppler and 3D Echo Indications:     CHF  History:         Patient has prior history of Echocardiogram examinations, most                  recent 08/27/2018. CHF, CAD and Previous Myocardial Infarction,                  Prior CABG and Abnormal ECG, PAD, Signs/Symptoms:Chest Pain;                  Risk Factors:Hypertension, Diabetes and Dyslipidemia.  Sonographer:     Mikki Harbor Referring Phys:  [id number] Brien Few NIU Diagnosing Phys: Debbe Odea MD IMPRESSIONS  1. Left ventricular ejection fraction, by estimation, is 30 to 35%. The left ventricle has moderate to severely decreased function. The left ventricle demonstrates global hypokinesis. Left ventricular diastolic parameters are indeterminate.  2. Right ventricular systolic function is normal. The right ventricular size is mildly enlarged. There is mildly elevated pulmonary artery systolic pressure.  3. Left atrial  size was mildly dilated.  4. The mitral valve is normal in structure. Mild mitral valve regurgitation.  5. Tricuspid valve regurgitation is mild to moderate.  6.  The aortic valve has been repaired/replaced. Aortic valve regurgitation is not visualized.  7. The inferior vena cava is dilated in size with <50% respiratory variability, suggesting right atrial pressure of 15 mmHg. FINDINGS  Left Ventricle: Left ventricular ejection fraction, by estimation, is 30 to 35%. The left ventricle has moderate to severely decreased function. The left ventricle demonstrates global hypokinesis. The left ventricular internal cavity size was normal in size. There is no left ventricular hypertrophy. Left ventricular diastolic parameters are indeterminate. Right Ventricle: The right ventricular size is mildly enlarged. No increase in right ventricular wall thickness. Right ventricular systolic function is normal. There is mildly elevated pulmonary artery systolic pressure. The tricuspid regurgitant velocity is 2.67 m/s, and with an assumed right atrial pressure of 15 mmHg, the estimated right ventricular systolic pressure is 43.5 mmHg. Left Atrium: Left atrial size was mildly dilated. Right Atrium: Right atrial size was normal in size. Pericardium: There is no evidence of pericardial effusion. Mitral Valve: The mitral valve is normal in structure. Mild mitral valve regurgitation. MV peak gradient, 6.1 mmHg. The mean mitral valve gradient is 2.0 mmHg. Tricuspid Valve: The tricuspid valve is normal in structure. Tricuspid valve regurgitation is mild to moderate. Aortic Valve: The aortic valve has been repaired/replaced. Aortic valve regurgitation is not visualized. Aortic valve mean gradient measures 2.0 mmHg. Aortic valve peak gradient measures 5.2 mmHg. Aortic valve area, by VTI measures 2.67 cm. Pulmonic Valve: The pulmonic valve was normal in structure. Pulmonic valve regurgitation is mild. Aorta: The aortic root is normal in size  and structure. Venous: The inferior vena cava is dilated in size with less than 50% respiratory variability, suggesting right atrial pressure of 15 mmHg. IAS/Shunts: No atrial level shunt detected by color flow Doppler.  LEFT VENTRICLE PLAX 2D LVIDd:         5.60 cm LVIDs:         4.90 cm LV PW:         1.20 cm LV IVS:        0.80 cm LVOT diam:     2.00 cm LV SV:         48 LV SV Index:   22 LVOT Area:     3.14 cm  LV Volumes (MOD) LV vol d, MOD A2C: 114.0 ml LV vol d, MOD A4C: 95.0 ml LV vol s, MOD A2C: 73.3 ml LV vol s, MOD A4C: 70.6 ml LV SV MOD A2C:     40.7 ml LV SV MOD A4C:     95.0 ml LV SV MOD BP:      32.1 ml RIGHT VENTRICLE RV Basal diam:  3.95 cm RV Mid diam:    4.20 cm RV S prime:     9.03 cm/s LEFT ATRIUM             Index        RIGHT ATRIUM           Index LA diam:        4.20 cm 1.98 cm/m   RA Area:     14.30 cm LA Vol (A2C):   62.2 ml 29.30 ml/m  RA Volume:   34.00 ml  16.02 ml/m LA Vol (A4C):   78.8 ml 37.12 ml/m LA Biplane Vol: 71.0 ml 33.45 ml/m  AORTIC VALVE                    PULMONIC VALVE AV Area (Vmax):    2.53 cm  PV Vmax:       0.87 m/s AV Area (Vmean):   2.63 cm     PV Peak grad:  3.0 mmHg AV Area (VTI):     2.67 cm AV Vmax:           114.00 cm/s AV Vmean:          68.200 cm/s AV VTI:            0.179 m AV Peak Grad:      5.2 mmHg AV Mean Grad:      2.0 mmHg LVOT Vmax:         91.80 cm/s LVOT Vmean:        57.200 cm/s LVOT VTI:          0.152 m LVOT/AV VTI ratio: 0.85  AORTA Ao Root diam: 3.70 cm MITRAL VALVE                TRICUSPID VALVE MV Area (PHT): 4.54 cm     TR Peak grad:   28.5 mmHg MV Area VTI:   2.45 cm     TR Vmax:        267.00 cm/s MV Peak grad:  6.1 mmHg MV Mean grad:  2.0 mmHg     SHUNTS MV Vmax:       1.23 m/s     Systemic VTI:  0.15 m MV Vmean:      63.0 cm/s    Systemic Diam: 2.00 cm MV Decel Time: 167 msec MV E velocity: 109.00 cm/s Debbe Odea MD Electronically signed by Debbe Odea MD Signature Date/Time: 02/26/2023/1:02:11 PM    Final     DG Chest 2 View Result Date: 02/25/2023 CLINICAL DATA:  Shortness of breath and chest pain. EXAM: CHEST - 2 VIEW COMPARISON:  Chest radiograph dated 03/28/2020. FINDINGS: Small bilateral pleural effusions and bibasilar atelectasis or infiltrate. There is mild cardiomegaly with mild central vascular congestion. No pneumothorax. Median sternotomy wires and CABG vascular clips. No acute osseous pathology. IMPRESSION: 1. Small bilateral pleural effusions and bibasilar atelectasis or infiltrate. 2. Mild cardiomegaly with mild central vascular congestion. Electronically Signed   By: Elgie Collard M.D.   On: 02/25/2023 18:48        Scheduled Meds:  amLODipine  10 mg Oral Daily   aspirin EC  81 mg Oral Daily   atorvastatin  5 mg Oral q1800   carvedilol  6.25 mg Oral BID WC   fluticasone  2 spray Each Nare Daily   furosemide  40 mg Intravenous Q12H   hydrALAZINE  50 mg Oral Q8H   insulin aspart  0-5 Units Subcutaneous QHS   insulin aspart  0-9 Units Subcutaneous TID WC   isosorbide mononitrate  30 mg Oral Daily   Continuous Infusions:  heparin 1,150 Units/hr (02/27/23 1139)   nitroGLYCERIN Stopped (02/26/23 1036)     LOS: 2 days     Tresa Moore, MD [facility]   If 7PM-7AM, please contact night-coverage  02/27/2023, 12:41 PM

## 2023-02-27 NOTE — Consult Note (Signed)
Pharmacy Consult Note - Anticoagulation  Pharmacy Consult for heparin Indication: chest pain/ACS  PATIENT MEASUREMENTS: Height: 6\' 3"  (190.5 cm) Weight: 83.9 kg (185 lb) IBW/kg (Calculated) : 84.5 HEPARIN DW (KG): 83.9  VITAL SIGNS: Temp: 97.3 F (36.3 C) (12/14 2029) BP: 143/73 (12/14 2029) Pulse Rate: 98 (12/14 2029)  Recent Labs    02/26/23 1700 02/26/23 1929 02/27/23 0152 02/27/23 0846 02/27/23 1449 02/27/23 2002  HGB  --   --  10.7*  --   --   --   HCT  --   --  33.4*  --   --   --   PLT  --   --  195  --   --   --   APTT 38*  --   --   --   --   --   LABPROT 15.9*  --   --   --   --   --   INR 1.3*  --   --   --   --   --   HEPARINUNFRC  --   --  0.46   < >  --  0.31  CREATININE  --   --  1.45*  --   --   --   TROPONINIHS 1,715*   < >  --    < > 5,326*  --    < > = values in this interval not displayed.    Estimated Creatinine Clearance: 45 mL/min (A) (by C-G formula based on SCr of 1.45 mg/dL (H)).  PAST MEDICAL HISTORY: Past Medical History:  Diagnosis Date   CHF (congestive heart failure) (HCC)    Diabetes mellitus without complication (HCC)    Heart attack (HCC)    Hyperlipidemia    Hypertension     ASSESSMENT: 82 y.o. male with PMH CHF, HTN, HLD is presenting with chest pain. cTn trending up. Patient is not on chronic anticoagulation per chart review. Pharmacy has been consulted to initiate and manage heparin intravenous infusion.  Pertinent medications: No chronic AC prior to admission per chart review  Goal(s) of therapy: Heparin level 0.3 - 0.7 units/mL Monitor platelets by anticoagulation protocol: Yes   Baseline anticoagulation labs: Recent Labs    02/25/23 1739 02/26/23 0512 02/26/23 1700 02/27/23 0152  APTT  --   --  38*  --   INR 1.2  --  1.3*  --   HGB 11.7* 10.5*  --  10.7*  PLT 243 192  --  195    Date Time aPTT/HL Rate/Comment 12/14 0152 HL 0.46 Therapeutic x 1  12/14 1020 HL 0.28  Subtherapeutic  12/14   2002   HL 0.31             Therapeutic x 1  PLAN: Heparin level is therapeutic. Will continue heparin infusion at 1150 units/hr. Recheck heparin level in 8 hours for confirmation. CBC daily while on heparin.   Clovia Cuff, PharmD, BCPS 02/27/2023 9:47 PM

## 2023-02-27 NOTE — Consult Note (Signed)
Pharmacy Consult Note - Anticoagulation  Pharmacy Consult for heparin Indication: chest pain/ACS  PATIENT MEASUREMENTS: Height: 6\' 3"  (190.5 cm) Weight: 83.9 kg (185 lb) IBW/kg (Calculated) : 84.5 HEPARIN DW (KG): 83.9  VITAL SIGNS: Temp: 99.7 F (37.6 C) (12/13 2328) Temp Source: Oral (12/13 2328) BP: 145/64 (12/13 2328) Pulse Rate: 94 (12/13 2328)  Recent Labs    02/26/23 1700 02/26/23 1929 02/26/23 2303 02/27/23 0152  HGB  --   --   --  10.7*  HCT  --   --   --  33.4*  PLT  --   --   --  195  APTT 38*  --   --   --   LABPROT 15.9*  --   --   --   INR 1.3*  --   --   --   HEPARINUNFRC  --   --   --  0.46  CREATININE  --   --   --  1.45*  TROPONINIHS 1,715*   < > 3,866*  --    < > = values in this interval not displayed.    Estimated Creatinine Clearance: 45 mL/min (A) (by C-G formula based on SCr of 1.45 mg/dL (H)).  PAST MEDICAL HISTORY: Past Medical History:  Diagnosis Date   CHF (congestive heart failure) (HCC)    Diabetes mellitus without complication (HCC)    Heart attack (HCC)    Hyperlipidemia    Hypertension     ASSESSMENT: 82 y.o. male with PMH CHF, HTN, HLD is presenting with chest pain. cTn trending up. Patient is not on chronic anticoagulation per chart review. Pharmacy has been consulted to initiate and manage heparin intravenous infusion.  Pertinent medications: No chronic AC prior to admission per chart review  Goal(s) of therapy: Heparin level 0.3 - 0.7 units/mL Monitor platelets by anticoagulation protocol: Yes   Baseline anticoagulation labs: Recent Labs    02/25/23 1739 02/26/23 0512 02/26/23 1700 02/27/23 0152  APTT  --   --  38*  --   INR 1.2  --  1.3*  --   HGB 11.7* 10.5*  --  10.7*  PLT 243 192  --  195    Date Time aPTT/HL Rate/Comment 12/14 0152 HL 0.46 Therapeutic x 1    PLAN: Continue heparin infusion at 1000 units/hour. Recheck HL in 8 hours to confirm Monitor CBC daily while on heparin infusion.  [name], PharmD, [facility] 02/27/2023 2:53 AM

## 2023-02-27 NOTE — Progress Notes (Signed)
Patient ID: Christopher Snow, male   DOB: 08/13/1940, 82 y.o.   MRN: [id number] [facility]    SUBJECTIVE: Patient still short of breath denies any pain resting comfortably with supplemental oxygen.  Denies any fever chills or sweats   Vitals:   02/27/23 0900 02/27/23 1000 02/27/23 1139 02/27/23 1350  BP:   128/63   Pulse:   81 88  Resp: 18 18 17 20   Temp:   98.4 F (36.9 C)   TempSrc:      SpO2:   94% 90%  Weight:      Height:         Intake/Output Summary (Last 24 hours) at 02/27/2023 1553 Last data filed at 02/27/2023 0420 Gross per 24 hour  Intake --  Output 1750 ml  Net -1750 ml      PHYSICAL EXAM  General: Well developed, well nourished, in no acute distress HEENT:  Normocephalic and atramatic Neck:  No JVD.  Lungs: Clear bilaterally to auscultation and percussion. Heart: HRRR . Normal S1 and S2 without gallops or murmurs.  Abdomen: Bowel sounds are positive, abdomen soft and non-tender  Msk:  Back normal, normal gait. Normal strength and tone for age. Extremities: No clubbing, cyanosis or edema.   Neuro: Alert and oriented X 3. Psych:  Good affect, responds appropriately   LABS: Basic Metabolic Panel: Recent Labs    02/26/23 0512 02/27/23 0152  NA 127* 128*  K 4.1 3.5  CL 94* 93*  CO2 20* 22  GLUCOSE 247* 154*  BUN 26* 28*  CREATININE 1.26* 1.45*  CALCIUM 8.4* 8.1*  MG 1.8 2.1   Liver Function Tests: Recent Labs    02/25/23 2139  AST 26  ALT 20  ALKPHOS 70  BILITOT 1.1  PROT 7.4  ALBUMIN 4.3   No results for input(s): "LIPASE", "AMYLASE" in the last 72 hours. CBC: Recent Labs    02/26/23 0512 02/27/23 0152  WBC 13.5* 13.0*  NEUTROABS  --  10.1*  HGB 10.5* 10.7*  HCT 33.1* 33.4*  MCV 74.5* 72.9*  PLT 192 195   Cardiac Enzymes: No results for input(s): "CKTOTAL", "CKMB", "CKMBINDEX", "TROPONINI" in the last 72 hours. BNP: Invalid input(s): "POCBNP" D-Dimer: No results for input(s): "DDIMER" in the last 72  hours. Hemoglobin A1C: No results for input(s): "HGBA1C" in the last 72 hours. Fasting Lipid Panel: No results for input(s): "CHOL", "HDL", "LDLCALC", "TRIG", "CHOLHDL", "LDLDIRECT" in the last 72 hours. Thyroid Function Tests: No results for input(s): "TSH", "T4TOTAL", "T3FREE", "THYROIDAB" in the last 72 hours.  Invalid input(s): "FREET3" Anemia Panel: No results for input(s): "VITAMINB12", "FOLATE", "FERRITIN", "TIBC", "IRON", "RETICCTPCT" in the last 72 hours.  DG Chest Port 1 View Result Date: 02/26/2023 CLINICAL DATA:  Hypoxia. EXAM: PORTABLE CHEST 1 VIEW COMPARISON:  02/25/2023. FINDINGS: There is diffuse pulmonary vascular congestion, slightly more pronounced than the prior exam. Redemonstration of left retrocardiac airspace opacity obscuring the left hemidiaphragm, descending thoracic aorta and blunting the left lateral costophrenic angle suggesting combination of left lower lobe atelectasis and/or consolidation with pleural effusion. Right lateral costophrenic angle is clear. Stable cardio-mediastinal silhouette. There are surgical staples along the heart border and sternotomy wires, status post CABG (coronary artery bypass graft). No acute osseous abnormalities. The soft tissues are within normal limits. IMPRESSION: *Findings favor worsening congestive heart failure/pulmonary edema. *Persistent left retrocardiac opacity, as described above. Electronically Signed   By: Jules Schick M.D.   On: 02/26/2023 18:06   ECHOCARDIOGRAM COMPLETE Result Date: 02/26/2023  ECHOCARDIOGRAM REPORT   Patient Name:   Christopher Snow Date of Exam: 02/26/2023 Medical Rec #:  [id number]         Height:       75.0 in Accession #:    [id number]        Weight:       185.0 lb Date of Birth:  1941-03-03         BSA:          2.123 m Patient Age:    82 years          BP:           142/68 mmHg Patient Gender: M                 HR:           106 bpm. Exam Location:  [facility] Procedure: 2D Echo, Cardiac Doppler, Color  Doppler and 3D Echo Indications:     CHF  History:         Patient has prior history of Echocardiogram examinations, most                  recent 08/27/2018. CHF, CAD and Previous Myocardial Infarction,                  Prior CABG and Abnormal ECG, PAD, Signs/Symptoms:Chest Pain;                  Risk Factors:Hypertension, Diabetes and Dyslipidemia.  Sonographer:     Mikki Harbor Referring Phys:  [id number] Brien Few NIU Diagnosing Phys: Debbe Odea MD IMPRESSIONS  1. Left ventricular ejection fraction, by estimation, is 30 to 35%. The left ventricle has moderate to severely decreased function. The left ventricle demonstrates global hypokinesis. Left ventricular diastolic parameters are indeterminate.  2. Right ventricular systolic function is normal. The right ventricular size is mildly enlarged. There is mildly elevated pulmonary artery systolic pressure.  3. Left atrial size was mildly dilated.  4. The mitral valve is normal in structure. Mild mitral valve regurgitation.  5. Tricuspid valve regurgitation is mild to moderate.  6. The aortic valve has been repaired/replaced. Aortic valve regurgitation is not visualized.  7. The inferior vena cava is dilated in size with <50% respiratory variability, suggesting right atrial pressure of 15 mmHg. FINDINGS  Left Ventricle: Left ventricular ejection fraction, by estimation, is 30 to 35%. The left ventricle has moderate to severely decreased function. The left ventricle demonstrates global hypokinesis. The left ventricular internal cavity size was normal in size. There is no left ventricular hypertrophy. Left ventricular diastolic parameters are indeterminate. Right Ventricle: The right ventricular size is mildly enlarged. No increase in right ventricular wall thickness. Right ventricular systolic function is normal. There is mildly elevated pulmonary artery systolic pressure. The tricuspid regurgitant velocity is 2.67 m/s, and with an assumed right atrial pressure of 15  mmHg, the estimated right ventricular systolic pressure is 43.5 mmHg. Left Atrium: Left atrial size was mildly dilated. Right Atrium: Right atrial size was normal in size. Pericardium: There is no evidence of pericardial effusion. Mitral Valve: The mitral valve is normal in structure. Mild mitral valve regurgitation. MV peak gradient, 6.1 mmHg. The mean mitral valve gradient is 2.0 mmHg. Tricuspid Valve: The tricuspid valve is normal in structure. Tricuspid valve regurgitation is mild to moderate. Aortic Valve: The aortic valve has been repaired/replaced. Aortic valve regurgitation is not visualized. Aortic valve mean gradient measures 2.0 mmHg. Aortic valve peak gradient measures 5.2 mmHg.  Aortic valve area, by VTI measures 2.67 cm. Pulmonic Valve: The pulmonic valve was normal in structure. Pulmonic valve regurgitation is mild. Aorta: The aortic root is normal in size and structure. Venous: The inferior vena cava is dilated in size with less than 50% respiratory variability, suggesting right atrial pressure of 15 mmHg. IAS/Shunts: No atrial level shunt detected by color flow Doppler.  LEFT VENTRICLE PLAX 2D LVIDd:         5.60 cm LVIDs:         4.90 cm LV PW:         1.20 cm LV IVS:        0.80 cm LVOT diam:     2.00 cm LV SV:         48 LV SV Index:   22 LVOT Area:     3.14 cm  LV Volumes (MOD) LV vol d, MOD A2C: 114.0 ml LV vol d, MOD A4C: 95.0 ml LV vol s, MOD A2C: 73.3 ml LV vol s, MOD A4C: 70.6 ml LV SV MOD A2C:     40.7 ml LV SV MOD A4C:     95.0 ml LV SV MOD BP:      32.1 ml RIGHT VENTRICLE RV Basal diam:  3.95 cm RV Mid diam:    4.20 cm RV S prime:     9.03 cm/s LEFT ATRIUM             Index        RIGHT ATRIUM           Index LA diam:        4.20 cm 1.98 cm/m   RA Area:     14.30 cm LA Vol (A2C):   62.2 ml 29.30 ml/m  RA Volume:   34.00 ml  16.02 ml/m LA Vol (A4C):   78.8 ml 37.12 ml/m LA Biplane Vol: 71.0 ml 33.45 ml/m  AORTIC VALVE                    PULMONIC VALVE AV Area (Vmax):    2.53 cm      PV Vmax:       0.87 m/s AV Area (Vmean):   2.63 cm     PV Peak grad:  3.0 mmHg AV Area (VTI):     2.67 cm AV Vmax:           114.00 cm/s AV Vmean:          68.200 cm/s AV VTI:            0.179 m AV Peak Grad:      5.2 mmHg AV Mean Grad:      2.0 mmHg LVOT Vmax:         91.80 cm/s LVOT Vmean:        57.200 cm/s LVOT VTI:          0.152 m LVOT/AV VTI ratio: 0.85  AORTA Ao Root diam: 3.70 cm MITRAL VALVE                TRICUSPID VALVE MV Area (PHT): 4.54 cm     TR Peak grad:   28.5 mmHg MV Area VTI:   2.45 cm     TR Vmax:        267.00 cm/s MV Peak grad:  6.1 mmHg MV Mean grad:  2.0 mmHg     SHUNTS MV Vmax:       1.23 m/s     Systemic VTI:  0.15 m MV Vmean:      63.0 cm/s    Systemic Diam: 2.00 cm MV Decel Time: 167 msec MV E velocity: 109.00 cm/s Debbe Odea MD Electronically signed by Debbe Odea MD Signature Date/Time: 02/26/2023/1:02:11 PM    Final    DG Chest 2 View Result Date: 02/25/2023 CLINICAL DATA:  Shortness of breath and chest pain. EXAM: CHEST - 2 VIEW COMPARISON:  Chest radiograph dated 03/28/2020. FINDINGS: Small bilateral pleural effusions and bibasilar atelectasis or infiltrate. There is mild cardiomegaly with mild central vascular congestion. No pneumothorax. Median sternotomy wires and CABG vascular clips. No acute osseous pathology. IMPRESSION: 1. Small bilateral pleural effusions and bibasilar atelectasis or infiltrate. 2. Mild cardiomegaly with mild central vascular congestion. Electronically Signed   By: Elgie Collard M.D.   On: 02/25/2023 18:48     Echo moderately depressed left ventricular function EF around 30 to 35%  TELEMETRY: Normal sinus rhythm left axis deviation right bundle branch block nonspecific ST-T wave changes rate of 80:  ASSESSMENT AND PLAN:  Principal Problem:   Acute on chronic systolic CHF (congestive heart failure) (HCC) Active Problems:   CAD (coronary artery disease)   Hypertensive emergency   Acute respiratory failure with hypoxia  (HCC)   Myocardial injury   HLD (hyperlipidemia)   Diabetes mellitus with peripheral circulatory disorder (HCC)   Hyponatremia   Leukocytosis   Dementia without behavioral disturbance (HCC)    Plan Manage breath dyspnea COPD as well as heart failure continue supplemental oxygen and pulmonary input Acute on chronic systolic congestive heart failure continue diuresis Renal insufficiency agree with nephrology input Multivessel coronary disease including coronary bypass surgery with anginal symptoms Non-STEMI elevated troponins chest pain improved continue current management anticoagulation Diabetes type 2 uncomplicated continue current therapy Abnormal chest x-ray imaging possible heart failure pneumonia bronchitis continue broad-spectrum antibiotic therapy Dementia persistent continue current management Cath was considered but will deferred because of worsening renal function and worsening pulmonary status Will continue to follow hopefully his respiratory status is improved    Alwyn Pea, MD, 02/27/2023 3:53 PM

## 2023-02-28 DIAGNOSIS — F0390 Unspecified dementia without behavioral disturbance: Secondary | ICD-10-CM | POA: Diagnosis not present

## 2023-02-28 DIAGNOSIS — J9601 Acute respiratory failure with hypoxia: Secondary | ICD-10-CM | POA: Diagnosis not present

## 2023-02-28 DIAGNOSIS — I25119 Atherosclerotic heart disease of native coronary artery with unspecified angina pectoris: Secondary | ICD-10-CM

## 2023-02-28 DIAGNOSIS — J189 Pneumonia, unspecified organism: Secondary | ICD-10-CM | POA: Diagnosis not present

## 2023-02-28 DIAGNOSIS — I161 Hypertensive emergency: Secondary | ICD-10-CM | POA: Diagnosis not present

## 2023-02-28 DIAGNOSIS — Z515 Encounter for palliative care: Secondary | ICD-10-CM | POA: Diagnosis not present

## 2023-02-28 DIAGNOSIS — I5023 Acute on chronic systolic (congestive) heart failure: Secondary | ICD-10-CM | POA: Diagnosis not present

## 2023-02-28 LAB — CBC
HCT: 32.1 % — ABNORMAL LOW (ref 39.0–52.0)
Hemoglobin: 10.1 g/dL — ABNORMAL LOW (ref 13.0–17.0)
MCH: 23.2 pg — ABNORMAL LOW (ref 26.0–34.0)
MCHC: 31.5 g/dL (ref 30.0–36.0)
MCV: 73.6 fL — ABNORMAL LOW (ref 80.0–100.0)
Platelets: 196 10*3/uL (ref 150–400)
RBC: 4.36 MIL/uL (ref 4.22–5.81)
RDW: 18.7 % — ABNORMAL HIGH (ref 11.5–15.5)
WBC: 10.6 10*3/uL — ABNORMAL HIGH (ref 4.0–10.5)
nRBC: 0 % (ref 0.0–0.2)

## 2023-02-28 LAB — BASIC METABOLIC PANEL
Anion gap: 11 (ref 5–15)
BUN: 38 mg/dL — ABNORMAL HIGH (ref 8–23)
CO2: 24 mmol/L (ref 22–32)
Calcium: 8 mg/dL — ABNORMAL LOW (ref 8.9–10.3)
Chloride: 97 mmol/L — ABNORMAL LOW (ref 98–111)
Creatinine, Ser: 1.52 mg/dL — ABNORMAL HIGH (ref 0.61–1.24)
GFR, Estimated: 45 mL/min — ABNORMAL LOW (ref >60)
Glucose, Bld: 147 mg/dL — ABNORMAL HIGH (ref 70–99)
Potassium: 3.2 mmol/L — ABNORMAL LOW (ref 3.5–5.1)
Sodium: 132 mmol/L — ABNORMAL LOW (ref 135–145)

## 2023-02-28 LAB — GLUCOSE, CAPILLARY
Glucose-Capillary: 152 mg/dL — ABNORMAL HIGH (ref 70–99)
Glucose-Capillary: 264 mg/dL — ABNORMAL HIGH (ref 70–99)
Glucose-Capillary: 155 mg/dL — ABNORMAL HIGH (ref 70–99)
Glucose-Capillary: 178 mg/dL — ABNORMAL HIGH (ref 70–99)

## 2023-02-28 LAB — HEPARIN LEVEL (UNFRACTIONATED)
Heparin Unfractionated: 0.27 [IU]/mL — ABNORMAL LOW (ref 0.30–0.70)
Heparin Unfractionated: 0.37 [IU]/mL (ref 0.30–0.70)
Heparin Unfractionated: 0.3 [IU]/mL (ref 0.30–0.70)

## 2023-02-28 LAB — TROPONIN I (HIGH SENSITIVITY)
Troponin I (High Sensitivity): 2647 ng/L (ref <18)
Troponin I (High Sensitivity): 2483 ng/L (ref <18)

## 2023-02-28 LAB — MAGNESIUM: Magnesium: 2.1 mg/dL (ref 1.7–2.4)

## 2023-02-28 MED ORDER — HEPARIN BOLUS VIA INFUSION
1300.0000 [IU] | Freq: Once | INTRAVENOUS | Status: AC
  Administered 2023-02-28: 1300 [IU] via INTRAVENOUS
  Filled 2023-02-28: qty 1300

## 2023-02-28 MED ORDER — POTASSIUM CHLORIDE CRYS ER 20 MEQ PO TBCR
40.0000 meq | EXTENDED_RELEASE_TABLET | Freq: Once | ORAL | Status: AC
  Administered 2023-02-28: 40 meq via ORAL
  Filled 2023-02-28: qty 2

## 2023-02-28 NOTE — Consult Note (Signed)
PHARMACY CONSULT NOTE - ELECTROLYTES  Pharmacy Consult for Electrolyte Monitoring and Replacement   Recent Labs: Height: 6\' 3"  (190.5 cm) Weight: 76.7 kg (169 lb 1.5 oz) IBW/kg (Calculated) : 84.5 Estimated Creatinine Clearance: 39.2 mL/min (A) (by C-G formula based on SCr of 1.52 mg/dL (H)).  Potassium (mmol/L)  Date Value  02/28/2023 3.2 (L)  11/03/2011 4.6   Magnesium (mg/dL)  Date Value  91/47/8295 2.1   Calcium (mg/dL)  Date Value  62/13/0865 8.0 (L)   Calcium, Total (mg/dL)  Date Value  78/46/9629 8.9   Albumin (g/dL)  Date Value  52/84/1324 4.3   Sodium (mmol/L)  Date Value  02/28/2023 132 (L)  11/03/2011 137   Assessment  Christopher Snow is a 82 y.o. male presenting with chest pain. PMH significant for sCHF with EF 35-405, HTN, HLD, DM, CAD, CBAG, PVD, and dementia. Pharmacy has been consulted to monitor and replace electrolytes.  Diet: PO MIVF: None Pertinent medications: Lasix 40 mg IV Q12H, spironolactone 12.5/d, losartan 25/d  Goal of Therapy: Electrolytes WNL  Plan:  K+ 3.2; will order KCl 40 mEq PO x 1  Check BMP, Mg, Phos with AM labs  Thank you for allowing pharmacy to be a part of this patient's care.  Littie Deeds, PharmD Pharmacy Resident  02/28/2023 7:59 AM

## 2023-02-28 NOTE — Consult Note (Signed)
Pharmacy Consult Note - Anticoagulation  Pharmacy Consult for heparin Indication: chest pain/ACS  PATIENT MEASUREMENTS: Height: 6\' 3"  (190.5 cm) Weight: 83.9 kg (185 lb) IBW/kg (Calculated) : 84.5 HEPARIN DW (KG): 83.9  VITAL SIGNS: Temp: 98.1 F (36.7 C) (12/15 0400) Temp Source: Oral (12/15 0400) BP: 146/65 (12/15 0400) Pulse Rate: 94 (12/15 0400)  Recent Labs    02/26/23 1700 02/26/23 1929 02/27/23 1449 02/27/23 2002 02/28/23 0434  HGB  --    < >  --   --  10.1*  HCT  --    < >  --   --  32.1*  PLT  --    < >  --   --  196  APTT 38*  --   --   --   --   LABPROT 15.9*  --   --   --   --   INR 1.3*  --   --   --   --   HEPARINUNFRC  --    < >  --    < > 0.27*  CREATININE  --    < >  --   --  1.52*  TROPONINIHS 1,715*   < > 5,326*  --   --    < > = values in this interval not displayed.    Estimated Creatinine Clearance: 42.9 mL/min (A) (by C-G formula based on SCr of 1.52 mg/dL (H)).  PAST MEDICAL HISTORY: Past Medical History:  Diagnosis Date   CHF (congestive heart failure) (HCC)    Diabetes mellitus without complication (HCC)    Heart attack (HCC)    Hyperlipidemia    Hypertension     ASSESSMENT: 82 y.o. male with PMH CHF, HTN, HLD is presenting with chest pain. cTn trending up. Patient is not on chronic anticoagulation per chart review. Pharmacy has been consulted to initiate and manage heparin intravenous infusion.  Pertinent medications: No chronic AC prior to admission per chart review  Goal(s) of therapy: Heparin level 0.3 - 0.7 units/mL Monitor platelets by anticoagulation protocol: Yes   Baseline anticoagulation labs: Recent Labs    02/25/23 1739 02/26/23 0512 02/26/23 1700 02/27/23 0152 02/28/23 0434  APTT  --   --  38*  --   --   INR 1.2  --  1.3*  --   --   HGB 11.7* 10.5*  --  10.7* 10.1*  PLT 243 192  --  195 196    Date Time aPTT/HL Rate/Comment 12/14 0152 HL 0.46 Therapeutic x 1  12/14 1020 HL 0.28  Subtherapeutic  12/14    2002   HL 0.31            Therapeutic x 1 12/15 0434 HL 0.27  Subtherapeutic  PLAN: Bolus 1300 units x 1 Increase heparin infusion to 1300 units/hr.  Recheck heparin level in 8 hours after rate change.  CBC daily while on heparin.   [name], PharmD, [facility] 02/28/2023 5:46 AM

## 2023-02-28 NOTE — Consult Note (Signed)
Pharmacy Consult Note - Anticoagulation  Pharmacy Consult for heparin Indication: chest pain/ACS  PATIENT MEASUREMENTS: Height: 6\' 3"  (190.5 cm) Weight: 76.7 kg (169 lb 1.5 oz) IBW/kg (Calculated) : 84.5 HEPARIN DW (KG): 83.9  VITAL SIGNS: Temp: 98 F (36.7 C) (12/15 0910) Temp Source: Oral (12/15 0400) BP: 107/52 (12/15 1227) Pulse Rate: 63 (12/15 1227)  Recent Labs    02/26/23 1700 02/26/23 1929 02/28/23 0434 02/28/23 0807 02/28/23 0945 02/28/23 1405  HGB  --    < > 10.1*  --   --   --   HCT  --    < > 32.1*  --   --   --   PLT  --    < > 196  --   --   --   APTT 38*  --   --   --   --   --   LABPROT 15.9*  --   --   --   --   --   INR 1.3*  --   --   --   --   --   HEPARINUNFRC  --    < > 0.27*  --   --  0.37  CREATININE  --    < > 1.52*  --   --   --   TROPONINIHS 1,715*   < >  --    < > 2,483*  --    < > = values in this interval not displayed.    Estimated Creatinine Clearance: 39.2 mL/min (A) (by C-G formula based on SCr of 1.52 mg/dL (H)).  PAST MEDICAL HISTORY: Past Medical History:  Diagnosis Date   CHF (congestive heart failure) (HCC)    Diabetes mellitus without complication (HCC)    Heart attack (HCC)    Hyperlipidemia    Hypertension     ASSESSMENT: 82 y.o. male with PMH CHF, HTN, HLD is presenting with chest pain. cTn trending up. Patient is not on chronic anticoagulation per chart review. Pharmacy has been consulted to initiate and manage heparin intravenous infusion.  Pertinent medications: No chronic AC prior to admission per chart review  Goal(s) of therapy: Heparin level 0.3 - 0.7 units/mL Monitor platelets by anticoagulation protocol: Yes   Baseline anticoagulation labs: Recent Labs    02/25/23 1739 02/26/23 0512 02/26/23 1700 02/27/23 0152 02/28/23 0434  APTT  --   --  38*  --   --   INR 1.2  --  1.3*  --   --   HGB 11.7* 10.5*  --  10.7* 10.1*  PLT 243 192  --  195 196    Date Time aPTT/HL Rate/Comment 12/14 0152 HL  0.46 Therapeutic x 1  12/14 1020 HL 0.28  Subtherapeutic  12/14   2002   HL 0.31            Therapeutic x 1 12/15 0434 HL 0.27  Subtherapeutic 12/15 1405 HL 0.37   PLAN: Heparin level is therapeutic. Will continue heparin infusion at 1300 units/hr. Recheck heparin level in 8 hours. CBC daily while on heparin.   Paschal Dopp, PharmD, BCPS 02/28/2023 2:36 PM

## 2023-02-28 NOTE — Progress Notes (Signed)
Heparin verification with Isabelle Course.

## 2023-02-28 NOTE — Consult Note (Signed)
Consultation Note Date: 02/28/2023   Patient Name: Christopher Snow  DOB: 1940-11-24  MRN: [id number]  Age / Sex: 82 y.o., male  PCP: Christopher Nurse, MD Referring Physician: Delfino Lovett, MD  Reason for Consultation: Establishing goals of care   HPI/Brief Hospital Course: 82 y.o. male  with past medical history of sCHF with EF 30-35% on most recent echo, HTN, HLD, DM, CAD s/p CABG, PVD and dementia admitted from home on 02/25/2023 with chest pain.  Found to be hypoxic in ED, placed on 4L [place] which has now progressed to [facility] which he remains on. BNP 822, CXR revealing vascular congestion-admitted and being treated for acute on chronic CHF with acute hypoxia, possible NSTEMI and hypertensive emergency. Remains on heparin infusion and receiving IV lasix.  Palliative medicine was consulted for assisting with goals of care conversations.  Subjective:  Extensive chart review has been completed prior to meeting patient including labs, vital signs, imaging, progress notes, orders, and available advanced directive documents from current and previous encounters.  Visited with Mr. Christopher Snow at his bedside. He is awake, alert and finishing his breakfast. He is not able to answer orientation questions correctly and unable to explain reasoning for hospitalization. Mr. Christopher Snow reports feeling much better and is eager to return home. No family at beside.  Called and spoke with daughter-Christopher Snow.  Introduced myself as a Publishing rights manager as a member of the palliative care team. Explained palliative medicine is specialized medical care for people living with serious illness. It focuses on providing relief from the symptoms and stress of a serious illness. The goal is to improve quality of life for both the patient and the family.   Christopher Snow shares her parents have been married for over 63 years and she is the only child. Christopher Snow lives next door to her parents and is involved in caring  for them daily. Mr. Christopher Snow is fairly independent at home, unable to drive but able to complete ADL's independently. Christopher Snow shares at baseline, Mr. Christopher Snow has memory impairment. It is baseline for him to be disoriented to time and situation.  Christopher Snow shares she has been receiving regular updates from the medial team. Anticipating Dr. Darrold Snow assessment and recommendation tomorrow.  We discussed patient's current illness and what it means in the larger context of patient's on-going co-morbidities. Natural disease trajectory and expectations at EOL were discussed.   Christopher Snow does not believe Mr. Christopher Snow has completed advanced directives in the past. Christopher Snow shares Mr. Christopher Snow has always been clear that he would not want his life prolonged artificially but that he has been clear he would be accepting of resuscitation.  Encouraged Christopher Snow to consider and continue conversations with Mr. Christopher Snow as well as her mother regarding code status due to severity of underlying heart disease and to continue conversations with members of medical team.  I discussed importance of continued conversations with family/support persons and all members of their medical team regarding overall plan of care and treatment options ensuring decisions are in alignment with patients goals of care.  All questions/concerns addressed. PMT will continue to follow and support patient as needed.   Objective: Primary Diagnoses: Present on Admission:  Acute on chronic systolic CHF (congestive heart failure) (HCC)  Acute respiratory failure with hypoxia (HCC)  CAD (coronary artery disease)  Hypertensive emergency  Myocardial injury  HLD (hyperlipidemia)  Diabetes mellitus with peripheral circulatory disorder (HCC)  Hyponatremia  Leukocytosis  Dementia without behavioral disturbance (HCC)   Physical Exam Constitutional:  General: He is not in acute distress.    Appearance: He is ill-appearing.  Pulmonary:     Effort: Pulmonary effort is  normal. No respiratory distress.  Skin:    General: Skin is warm and dry.     Findings: Bruising present.  Neurological:     Mental Status: He is alert. He is disoriented.     Motor: Weakness present.     Vital Signs: BP (!) 107/52 (BP Location: Right Arm)   Pulse 63   Temp 98 F (36.7 C)   Resp 15   Ht 6\' 3"  (1.905 m)   Wt 76.7 kg   SpO2 92%   BMI 21.14 kg/m  Pain Scale: 0-10   Pain Score: 0-No pain  IO: Intake/output summary:  Intake/Output Summary (Last 24 hours) at 02/28/2023 1316 Last data filed at 02/28/2023 0500 Gross per 24 hour  Intake 756.39 ml  Output 1300 ml  Net -543.61 ml    LBM: Last BM Date : 02/26/23 Baseline Weight: Weight: 83.9 kg Most recent weight: Weight: 76.7 kg       Palliative Assessment/Data:70%   Assessment and Plan  SUMMARY OF RECOMMENDATIONS   Time for outcomes and ongoing GOC conversations  Palliative Prophylaxis:   Bowel Regimen, Delirium Protocol and Frequent Pain Assessment  Thank you for this consult and allowing Palliative Medicine to participate in the care of Christopher Snow. Christopher Snow. Palliative medicine will continue to follow and assist as needed.   Time Total: 75 minutes  Time spent includes: Detailed review of medical records (labs, imaging, vital signs), medically appropriate exam (mental status, respiratory, cardiac, skin), discussed with treatment team, counseling and educating patient, family and staff, documenting clinical information, medication management and coordination of care.   Signed by: Christopher Deed, DNP, AGNP-C Palliative Medicine    Please contact Palliative Medicine Team phone at [phone] for questions and concerns.  For individual provider: See Loretha Stapler

## 2023-02-28 NOTE — Plan of Care (Signed)

## 2023-02-28 NOTE — Plan of Care (Signed)
  Problem: Education: Goal: Ability to describe self-care measures that may prevent or decrease complications (Diabetes Survival Skills Education) will improve Outcome: Progressing   Problem: Coping: Goal: Ability to adjust to condition or change in health will improve Outcome: Progressing   Problem: Fluid Volume: Goal: Ability to maintain a balanced intake and output will improve Outcome: Progressing   

## 2023-02-28 NOTE — Consult Note (Addendum)
Pharmacy Consult Note - Anticoagulation  Pharmacy Consult for heparin Indication: chest pain/ACS  PATIENT MEASUREMENTS: Height: 6\' 3"  (190.5 cm) Weight: 76.7 kg (169 lb 1.5 oz) IBW/kg (Calculated) : 84.5 HEPARIN DW (KG): 83.9  VITAL SIGNS: Temp: 98.4 F (36.9 C) (12/15 2057) BP: 135/62 (12/15 2237) Pulse Rate: 67 (12/15 2057)  Recent Labs    02/26/23 1700 02/26/23 1929 02/28/23 0434 02/28/23 0807 02/28/23 0945 02/28/23 1405 02/28/23 2208  HGB  --    < > 10.1*  --   --   --   --   HCT  --    < > 32.1*  --   --   --   --   PLT  --    < > 196  --   --   --   --   APTT 38*  --   --   --   --   --   --   LABPROT 15.9*  --   --   --   --   --   --   INR 1.3*  --   --   --   --   --   --   HEPARINUNFRC  --    < > 0.27*  --   --    < > 0.30  CREATININE  --    < > 1.52*  --   --   --   --   TROPONINIHS 1,715*   < >  --    < > 2,483*  --   --    < > = values in this interval not displayed.    Estimated Creatinine Clearance: 39.2 mL/min (A) (by C-G formula based on SCr of 1.52 mg/dL (H)).  PAST MEDICAL HISTORY: Past Medical History:  Diagnosis Date   CHF (congestive heart failure) (HCC)    Diabetes mellitus without complication (HCC)    Heart attack (HCC)    Hyperlipidemia    Hypertension     ASSESSMENT: 82 y.o. male with PMH CHF, HTN, HLD is presenting with chest pain. cTn trending up. Patient is not on chronic anticoagulation per chart review. Pharmacy has been consulted to initiate and manage heparin intravenous infusion.  Pertinent medications: No chronic AC prior to admission per chart review  Goal(s) of therapy: Heparin level 0.3 - 0.7 units/mL Monitor platelets by anticoagulation protocol: Yes   Baseline anticoagulation labs: Recent Labs    02/26/23 0512 02/26/23 1700 02/27/23 0152 02/28/23 0434  APTT  --  38*  --   --   INR  --  1.3*  --   --   HGB 10.5*  --  10.7* 10.1*  PLT 192  --  195 196    Date Time aPTT/HL Rate/Comment 12/14 0152 HL  0.46 Therapeutic x 1  12/14 1020 HL 0.28  Subtherapeutic  12/14   2002   HL 0.31            Therapeutic x 1 12/15 0434 HL 0.27  Subtherapeutic 12/15 1405 HL 0.37 12/15 2208 HL 0.30 Therapeutic x 2  PLAN: Heparin level is therapeutic. Will continue heparin infusion at 1300 units/hr. Recheck heparin level daily w/ AM labs while therapeutic. CBC daily while on heparin.  [name], PharmD, [facility] 02/28/2023 10:42 PM

## 2023-02-28 NOTE — Progress Notes (Signed)
PROGRESS NOTE    Christopher Snow  ZOX:[id number] DOB: 1940/09/19 DOA: 02/25/2023 PCP: Gracelyn Nurse, MD    Brief Narrative:  82 y.o. male with medical history significant of sCHF with EF 35-405,  HTN, HLD, DM, CAD, CBAG, PVD, dementia, who presents with chest pain.   Per his daughter at bedside, pt started having chest pain since afternoon.  The chest pain is located in left side of chest, radiating to the left arm.  Patient cannot characterize his pain in detail due to dementia.  Patient has mild dry cough.  Patient does not have significant shortness breath, but was found to have oxygen desaturation to 80s on room air in ED, which improved to 92-96% on 4 L of oxygen.  Patient is normally not using oxygen at home.  Patient blood pressure is significantly elevated with SBP 200s, nitroglycerin drip is started in ED.   12/15: Palliative care consult.  Repleting electrolytes, weaning off nitroglycerin drip   Assessment & Plan:   Principal Problem:   Acute on chronic systolic CHF (congestive heart failure) (HCC) Active Problems:   Acute respiratory failure with hypoxia (HCC)   Hypertensive emergency   CAD (coronary artery disease)   Myocardial injury   HLD (hyperlipidemia)   Diabetes mellitus with peripheral circulatory disorder (HCC)   Hyponatremia   Leukocytosis   Dementia without behavioral disturbance (HCC)  Acute respiratory failure with hypoxia due to acute on chronic systolic CHF (congestive heart failure) (HCC):  Patient is 4 L of new oxygen requirement.  2D echo on 08/27/2018 showed EF of 35-40%.  Patient does not have leg edema, but has elevated BNP 822, positive JVD, crackles on auscultation, vascular congestion on chest x-ray, clinically consistent with CHF exacerbation.  Initially required only 4 L however oxygen requirements rapidly escalated on 12/13 and patient was placed on heated high flow nasal cannula.  2D echocardiogram with EF 30 to 35%, previous in 20 2035 to  40%. Plan: Continue oral antihypertensive regimen Lasix 40 mg IV twice daily (received 140 mg total on 12/13) Strict and outs, daily weights, fluid restrict Cardiology Dr. Juliann Pares following.  He is planning to discuss with Dr. Darrold Junker and decide the need for cardiac catheterization   Hypertensive emergency:  Initially noted systolic blood pressure in the 200s Plan: Wean off nitroglycerin drip Amlodipine 10 mg daily Hydralazine 50 mg 3 times daily Imdur Coreg Daughter states patient no longer on Entresto.  stop for now pending further cardiology recommendations    CAD (coronary artery disease) Elevated troponins Troponins peaked at 5398 and now trending down at 2647.  Appears to be plateauing.  Suspect supply/demand ischemia in the setting of decompensated heart failure and hypertensive urgency but unable to exclude NSTEMI at this time Plan: Cardiac telemetry Continue Aspirin Statin Cardiology to decide on need for cardiac catheterization  HLD (hyperlipidemia) -Lipitor   Diabetes mellitus with peripheral circulatory disorder [facility]):  Patient is taking metformin, Jardiance and glipizide -Sliding scale insulin   Hyponatremia:  Suspect hypervolemic hyponatremia Plan: Diuresis as above Careful monitoring of serum sodium   Leukocytosis:  WBC 16.2.  No fever, procalcitonin 0.11 No clinical evidence of infection Plan: Hold antibiotics Follow blood cultures Monitor vitals and fever curve   Dementia without behavioral disturbance (HCC)  Mental status at baseline -Fall precaution  DVT prophylaxis: Lovenox Code Status: Full Family Communication: Daughter at bedside 12/13, 12/14 Disposition Plan: Status is: Inpatient Remains inpatient appropriate because: Acute on chronic systolic congestive heart failure, hypertensive urgency, elevated troponins  Level of care: Progressive  Consultants:  None  Procedures:   None  Antimicrobials: None   Subjective:  Remains comfortable, no new issues daughter at bedside.   Objective: Vitals:   02/27/23 2346 02/28/23 0400 02/28/23 0500 02/28/23 0910  BP: (!) 145/63 (!) 146/65  (!) 132/58  Pulse: 90 94  68  Resp: (!) 22 20  16   Temp: 98.9 F (37.2 C) 98.1 F (36.7 C)  98 F (36.7 C)  TempSrc:  Oral    SpO2: 92% 92%  92%  Weight:   76.7 kg   Height:        Intake/Output Summary (Last 24 hours) at 02/28/2023 1014 Last data filed at 02/28/2023 0500 Gross per 24 hour  Intake 996.39 ml  Output 1900 ml  Net -903.61 ml   Filed Weights   02/25/23 1736 02/28/23 0500  Weight: 83.9 kg 76.7 kg    Examination:  General exam: NAD Respiratory system: Crackles.  Improved air entry.  Normal work of breathing.  Heated high flow Cardiovascular system: S1-S2, RRR, no murmurs, no pedal edema Gastrointestinal system: Soft, NT/ND, normal bowel sounds Central nervous system: Alert and oriented. No focal neurological deficits. Extremities: Symmetric 5 x 5 power. Skin: No rashes, lesions or ulcers Psychiatry: Judgement and insight appear normal. Mood & affect appropriate.     Data Reviewed: I have personally reviewed following labs and imaging studies  CBC: Recent Labs  Lab 02/25/23 1739 02/26/23 0512 02/27/23 0152 02/28/23 0434  WBC 16.2* 13.5* 13.0* 10.6*  NEUTROABS  --   --  10.1*  --   HGB 11.7* 10.5* 10.7* 10.1*  HCT 38.1* 33.1* 33.4* 32.1*  MCV 75.0* 74.5* 72.9* 73.6*  PLT 243 192 195 196   Basic Metabolic Panel: Recent Labs  Lab 02/25/23 1739 02/25/23 2355 02/26/23 0512 02/27/23 0152 02/28/23 0434  NA 128* 129* 127* 128* 132*  K 3.9 4.4 4.1 3.5 3.2*  CL 95* 96* 94* 93* 97*  CO2 19* 20* 20* 22 24  GLUCOSE 146* 256* 247* 154* 147*  BUN 24* 26* 26* 28* 38*  CREATININE 1.18 1.28* 1.26* 1.45* 1.52*  CALCIUM 8.9 8.6* 8.4* 8.1* 8.0*  MG 1.9  --  1.8 2.1 2.1   GFR: Estimated Creatinine Clearance: 39.2 mL/min (A) (by C-G formula  based on SCr of 1.52 mg/dL (H)). Liver Function Tests: Recent Labs  Lab 02/25/23 2139  AST 26  ALT 20  ALKPHOS 70  BILITOT 1.1  PROT 7.4  ALBUMIN 4.3   No results for input(s): "LIPASE", "AMYLASE" in the last 168 hours. No results for input(s): "AMMONIA" in the last 168 hours. Coagulation Profile: Recent Labs  Lab 02/25/23 1739 02/26/23 1700  INR 1.2 1.3*    CBG: Recent Labs  Lab 02/27/23 0824 02/27/23 1141 02/27/23 1720 02/27/23 2107 02/28/23 0926  GLUCAP 150* 235* 141* 293* 152*    Sepsis Labs: Recent Labs  Lab 02/25/23 1739 02/25/23 2209 02/25/23 2355  PROCALCITON 0.11  --   --   LATICACIDVEN  --  1.5 1.5    Recent Results (from the past 240 hours)  Resp panel by RT-PCR (RSV, Flu A&B, Covid) Anterior Nasal Swab     Status: None   Collection Time: 02/25/23 10:09 PM   Specimen: Anterior Nasal Swab  Result Value Ref Range Status   SARS Coronavirus 2 by RT PCR NEGATIVE NEGATIVE Final    Comment: (NOTE) SARS-CoV-2 target nucleic acids are NOT DETECTED.  The SARS-CoV-2 RNA is generally detectable in upper respiratory  specimens during the acute phase of infection. The lowest concentration of SARS-CoV-2 viral copies this assay can detect is 138 copies/mL. A negative result does not preclude SARS-Cov-2 infection and should not be used as the sole basis for treatment or other patient management decisions. A negative result may occur with  improper specimen collection/handling, submission of specimen other than nasopharyngeal swab, presence of viral mutation(s) within the areas targeted by this assay, and inadequate number of viral copies(<138 copies/mL). A negative result must be combined with clinical observations, patient history, and epidemiological information. The expected result is Negative.  Fact Sheet for Patients:  BloggerCourse.com  Fact Sheet for Healthcare Providers:  SeriousBroker.it  This  test is no t yet approved or cleared by the [place] FDA and  has been authorized for detection and/or diagnosis of SARS-CoV-2 by FDA under an Emergency Use Authorization (EUA). This EUA will remain  in effect (meaning this test can be used) for the duration of the COVID-19 declaration under Section 564(b)(1) of the Act, 21 U.S.C.section 360bbb-3(b)(1), unless the authorization is terminated  or revoked sooner.       Influenza A by PCR NEGATIVE NEGATIVE Final   Influenza B by PCR NEGATIVE NEGATIVE Final    Comment: (NOTE) The Xpert Xpress SARS-CoV-2/FLU/RSV plus assay is intended as an aid in the diagnosis of influenza from Nasopharyngeal swab specimens and should not be used as a sole basis for treatment. Nasal washings and aspirates are unacceptable for Xpert Xpress SARS-CoV-2/FLU/RSV testing.  Fact Sheet for Patients: BloggerCourse.com  Fact Sheet for Healthcare Providers: SeriousBroker.it  This test is not yet approved or cleared by the [place] FDA and has been authorized for detection and/or diagnosis of SARS-CoV-2 by FDA under an Emergency Use Authorization (EUA). This EUA will remain in effect (meaning this test can be used) for the duration of the COVID-19 declaration under Section 564(b)(1) of the Act, 21 U.S.C. section 360bbb-3(b)(1), unless the authorization is terminated or revoked.     Resp Syncytial Virus by PCR NEGATIVE NEGATIVE Final    Comment: (NOTE) Fact Sheet for Patients: BloggerCourse.com  Fact Sheet for Healthcare Providers: SeriousBroker.it  This test is not yet approved or cleared by the [place] FDA and has been authorized for detection and/or diagnosis of SARS-CoV-2 by FDA under an Emergency Use Authorization (EUA). This EUA will remain in effect (meaning this test can be used) for the duration of the COVID-19 declaration under  Section 564(b)(1) of the Act, 21 U.S.C. section 360bbb-3(b)(1), unless the authorization is terminated or revoked.  Performed at [facility], [address] [place], [place] [address]   Blood Culture (routine x 2)     Status: None (Preliminary result)   Collection Time: 02/25/23 10:10 PM   Specimen: BLOOD  Result Value Ref Range Status   Specimen Description BLOOD LEFT ASSIST CONTROL  Final   Special Requests   Final    BOTTLES DRAWN AEROBIC AND ANAEROBIC Blood Culture adequate volume   Culture   Final    NO GROWTH 3 DAYS Performed at [facility], [address] [place], [place] [address]    Report Status PENDING  Incomplete  Blood Culture (routine x 2)     Status: None (Preliminary result)   Collection Time: 02/25/23 10:10 PM   Specimen: BLOOD  Result Value Ref Range Status   Specimen Description BLOOD RIGHT FOREARM  Final   Special Requests   Final    BOTTLES DRAWN AEROBIC AND ANAEROBIC Blood Culture adequate  volume   Culture   Final    NO GROWTH 3 DAYS Performed at [facility], [address] [place], [place] [address]    Report Status PENDING  Incomplete         Radiology Studies: DG Chest [facility] 1 View Result Date: 02/26/2023 CLINICAL DATA:  Hypoxia. EXAM: PORTABLE CHEST 1 VIEW COMPARISON:  02/25/2023. FINDINGS: There is diffuse pulmonary vascular congestion, slightly more pronounced than the prior exam. Redemonstration of left retrocardiac airspace opacity obscuring the left hemidiaphragm, descending thoracic aorta and blunting the left lateral costophrenic angle suggesting combination of left lower lobe atelectasis and/or consolidation with pleural effusion. Right lateral costophrenic angle is clear. Stable cardio-mediastinal silhouette. There are surgical staples along the heart border and sternotomy wires, status post CABG (coronary artery bypass graft). No acute osseous abnormalities. The soft tissues are within normal limits.  IMPRESSION: *Findings favor worsening congestive heart failure/pulmonary edema. *Persistent left retrocardiac opacity, as described above. Electronically Signed   By: Jules Schick M.D.   On: 02/26/2023 18:06   ECHOCARDIOGRAM COMPLETE Result Date: 02/26/2023    ECHOCARDIOGRAM REPORT   Patient Name:   BLAISE FALANGA Date of Exam: 02/26/2023 Medical Rec #:  [id number]         Height:       75.0 in Accession #:    [id number]        Weight:       185.0 lb Date of Birth:  Jun 24, 1940         BSA:          2.123 m Patient Age:    82 years          BP:           142/68 mmHg Patient Gender: M                 HR:           106 bpm. Exam Location:  [facility] Procedure: 2D Echo, Cardiac Doppler, Color Doppler and 3D Echo Indications:     CHF  History:         Patient has prior history of Echocardiogram examinations, most                  recent 08/27/2018. CHF, CAD and Previous Myocardial Infarction,                  Prior CABG and Abnormal ECG, PAD, Signs/Symptoms:Chest Pain;                  Risk Factors:Hypertension, Diabetes and Dyslipidemia.  Sonographer:     Mikki Harbor Referring Phys:  [id number] Brien Few NIU Diagnosing Phys: Debbe Odea MD IMPRESSIONS  1. Left ventricular ejection fraction, by estimation, is 30 to 35%. The left ventricle has moderate to severely decreased function. The left ventricle demonstrates global hypokinesis. Left ventricular diastolic parameters are indeterminate.  2. Right ventricular systolic function is normal. The right ventricular size is mildly enlarged. There is mildly elevated pulmonary artery systolic pressure.  3. Left atrial size was mildly dilated.  4. The mitral valve is normal in structure. Mild mitral valve regurgitation.  5. Tricuspid valve regurgitation is mild to moderate.  6. The aortic valve has been repaired/replaced. Aortic valve regurgitation is not visualized.  7. The inferior vena cava is dilated in size with <50% respiratory variability, suggesting right atrial  pressure of 15 mmHg. FINDINGS  Left Ventricle: Left ventricular ejection fraction, by estimation, is 30 to 35%. The  left ventricle has moderate to severely decreased function. The left ventricle demonstrates global hypokinesis. The left ventricular internal cavity size was normal in size. There is no left ventricular hypertrophy. Left ventricular diastolic parameters are indeterminate. Right Ventricle: The right ventricular size is mildly enlarged. No increase in right ventricular wall thickness. Right ventricular systolic function is normal. There is mildly elevated pulmonary artery systolic pressure. The tricuspid regurgitant velocity is 2.67 m/s, and with an assumed right atrial pressure of 15 mmHg, the estimated right ventricular systolic pressure is 43.5 mmHg. Left Atrium: Left atrial size was mildly dilated. Right Atrium: Right atrial size was normal in size. Pericardium: There is no evidence of pericardial effusion. Mitral Valve: The mitral valve is normal in structure. Mild mitral valve regurgitation. MV peak gradient, 6.1 mmHg. The mean mitral valve gradient is 2.0 mmHg. Tricuspid Valve: The tricuspid valve is normal in structure. Tricuspid valve regurgitation is mild to moderate. Aortic Valve: The aortic valve has been repaired/replaced. Aortic valve regurgitation is not visualized. Aortic valve mean gradient measures 2.0 mmHg. Aortic valve peak gradient measures 5.2 mmHg. Aortic valve area, by VTI measures 2.67 cm. Pulmonic Valve: The pulmonic valve was normal in structure. Pulmonic valve regurgitation is mild. Aorta: The aortic root is normal in size and structure. Venous: The inferior vena cava is dilated in size with less than 50% respiratory variability, suggesting right atrial pressure of 15 mmHg. IAS/Shunts: No atrial level shunt detected by color flow Doppler.  LEFT VENTRICLE PLAX 2D LVIDd:         5.60 cm LVIDs:         4.90 cm LV PW:         1.20 cm LV IVS:        0.80 cm LVOT diam:     2.00 cm  LV SV:         48 LV SV Index:   22 LVOT Area:     3.14 cm  LV Volumes (MOD) LV vol d, MOD A2C: 114.0 ml LV vol d, MOD A4C: 95.0 ml LV vol s, MOD A2C: 73.3 ml LV vol s, MOD A4C: 70.6 ml LV SV MOD A2C:     40.7 ml LV SV MOD A4C:     95.0 ml LV SV MOD BP:      32.1 ml RIGHT VENTRICLE RV Basal diam:  3.95 cm RV Mid diam:    4.20 cm RV S prime:     9.03 cm/s LEFT ATRIUM             Index        RIGHT ATRIUM           Index LA diam:        4.20 cm 1.98 cm/m   RA Area:     14.30 cm LA Vol (A2C):   62.2 ml 29.30 ml/m  RA Volume:   34.00 ml  16.02 ml/m LA Vol (A4C):   78.8 ml 37.12 ml/m LA Biplane Vol: 71.0 ml 33.45 ml/m  AORTIC VALVE                    PULMONIC VALVE AV Area (Vmax):    2.53 cm     PV Vmax:       0.87 m/s AV Area (Vmean):   2.63 cm     PV Peak grad:  3.0 mmHg AV Area (VTI):     2.67 cm AV Vmax:  114.00 cm/s AV Vmean:          68.200 cm/s AV VTI:            0.179 m AV Peak Grad:      5.2 mmHg AV Mean Grad:      2.0 mmHg LVOT Vmax:         91.80 cm/s LVOT Vmean:        57.200 cm/s LVOT VTI:          0.152 m LVOT/AV VTI ratio: 0.85  AORTA Ao Root diam: 3.70 cm MITRAL VALVE                TRICUSPID VALVE MV Area (PHT): 4.54 cm     TR Peak grad:   28.5 mmHg MV Area VTI:   2.45 cm     TR Vmax:        267.00 cm/s MV Peak grad:  6.1 mmHg MV Mean grad:  2.0 mmHg     SHUNTS MV Vmax:       1.23 m/s     Systemic VTI:  0.15 m MV Vmean:      63.0 cm/s    Systemic Diam: 2.00 cm MV Decel Time: 167 msec MV E velocity: 109.00 cm/s Debbe Odea MD Electronically signed by Debbe Odea MD Signature Date/Time: 02/26/2023/1:02:11 PM    Final         Scheduled Meds:  aspirin EC  81 mg Oral Daily   atorvastatin  5 mg Oral q1800   carvedilol  12.5 mg Oral BID WC   fluticasone  2 spray Each Nare Daily   furosemide  40 mg Intravenous Q12H   hydrALAZINE  50 mg Oral Q8H   insulin aspart  0-5 Units Subcutaneous QHS   insulin aspart  0-9 Units Subcutaneous TID WC   isosorbide mononitrate  60  mg Oral Daily   losartan  25 mg Oral Daily   spironolactone  12.5 mg Oral Daily   Continuous Infusions:  heparin 1,300 Units/hr (02/28/23 0940)   nitroGLYCERIN Stopped (02/26/23 1036)     LOS: 3 days   Time spent 35 minutes  Delfino Lovett, MD [facility]   If 7PM-7AM, please contact night-coverage  02/28/2023, 10:14 AM

## 2023-03-01 ENCOUNTER — Inpatient Hospital Stay: Payer: No Typology Code available for payment source

## 2023-03-01 DIAGNOSIS — R0902 Hypoxemia: Secondary | ICD-10-CM | POA: Diagnosis not present

## 2023-03-01 DIAGNOSIS — I5023 Acute on chronic systolic (congestive) heart failure: Secondary | ICD-10-CM | POA: Diagnosis not present

## 2023-03-01 DIAGNOSIS — J189 Pneumonia, unspecified organism: Secondary | ICD-10-CM | POA: Diagnosis not present

## 2023-03-01 DIAGNOSIS — I509 Heart failure, unspecified: Secondary | ICD-10-CM | POA: Diagnosis not present

## 2023-03-01 DIAGNOSIS — I161 Hypertensive emergency: Secondary | ICD-10-CM | POA: Diagnosis not present

## 2023-03-01 DIAGNOSIS — I25119 Atherosclerotic heart disease of native coronary artery with unspecified angina pectoris: Secondary | ICD-10-CM | POA: Diagnosis not present

## 2023-03-01 DIAGNOSIS — J9601 Acute respiratory failure with hypoxia: Secondary | ICD-10-CM | POA: Diagnosis not present

## 2023-03-01 LAB — CBC
HCT: 34.4 % — ABNORMAL LOW (ref 39.0–52.0)
Hemoglobin: 10.9 g/dL — ABNORMAL LOW (ref 13.0–17.0)
MCH: 23.1 pg — ABNORMAL LOW (ref 26.0–34.0)
MCHC: 31.7 g/dL (ref 30.0–36.0)
MCV: 72.9 fL — ABNORMAL LOW (ref 80.0–100.0)
Platelets: 260 10*3/uL (ref 150–400)
RBC: 4.72 MIL/uL (ref 4.22–5.81)
RDW: 18.8 % — ABNORMAL HIGH (ref 11.5–15.5)
WBC: 9 10*3/uL (ref 4.0–10.5)
nRBC: 0 % (ref 0.0–0.2)

## 2023-03-01 LAB — BASIC METABOLIC PANEL
Anion gap: 12 (ref 5–15)
BUN: 48 mg/dL — ABNORMAL HIGH (ref 8–23)
CO2: 23 mmol/L (ref 22–32)
Calcium: 8.5 mg/dL — ABNORMAL LOW (ref 8.9–10.3)
Chloride: 95 mmol/L — ABNORMAL LOW (ref 98–111)
Creatinine, Ser: 1.72 mg/dL — ABNORMAL HIGH (ref 0.61–1.24)
GFR, Estimated: 39 mL/min — ABNORMAL LOW (ref >60)
Glucose, Bld: 175 mg/dL — ABNORMAL HIGH (ref 70–99)
Potassium: 4 mmol/L (ref 3.5–5.1)
Sodium: 130 mmol/L — ABNORMAL LOW (ref 135–145)

## 2023-03-01 LAB — GLUCOSE, CAPILLARY
Glucose-Capillary: 241 mg/dL — ABNORMAL HIGH (ref 70–99)
Glucose-Capillary: 193 mg/dL — ABNORMAL HIGH (ref 70–99)
Glucose-Capillary: 187 mg/dL — ABNORMAL HIGH (ref 70–99)

## 2023-03-01 LAB — HEPARIN LEVEL (UNFRACTIONATED)
Heparin Unfractionated: 0.27 [IU]/mL — ABNORMAL LOW (ref 0.30–0.70)
Heparin Unfractionated: 0.48 [IU]/mL (ref 0.30–0.70)
Heparin Unfractionated: 0.54 [IU]/mL (ref 0.30–0.70)

## 2023-03-01 LAB — PHOSPHORUS: Phosphorus: 4.1 mg/dL (ref 2.5–4.6)

## 2023-03-01 LAB — MAGNESIUM: Magnesium: 2.6 mg/dL — ABNORMAL HIGH (ref 1.7–2.4)

## 2023-03-01 LAB — C-REACTIVE PROTEIN: CRP: 19 mg/dL — ABNORMAL HIGH (ref <1.0)

## 2023-03-01 MED ORDER — CARVEDILOL 6.25 MG PO TABS
6.2500 mg | ORAL_TABLET | Freq: Two times a day (BID) | ORAL | Status: DC
  Administered 2023-03-01 – 2023-03-03 (×4): 6.25 mg via ORAL
  Filled 2023-03-01 (×4): qty 1

## 2023-03-01 MED ORDER — TORSEMIDE 20 MG PO TABS: 20.0000 mg | ORAL_TABLET | Freq: Every day | ORAL | Status: DC

## 2023-03-01 MED ORDER — TRAZODONE HCL 50 MG PO TABS
50.0000 mg | ORAL_TABLET | Freq: Once | ORAL | Status: AC
  Administered 2023-03-01: 50 mg via ORAL
  Filled 2023-03-01: qty 1

## 2023-03-01 MED ORDER — HEPARIN BOLUS VIA INFUSION
1300.0000 [IU] | Freq: Once | INTRAVENOUS | Status: AC
  Administered 2023-03-01: 1300 [IU] via INTRAVENOUS
  Filled 2023-03-01: qty 1300

## 2023-03-01 MED ORDER — ACETAMINOPHEN 325 MG PO TABS: 650.0000 mg | ORAL_TABLET | Freq: Four times a day (QID) | ORAL | Status: DC | PRN

## 2023-03-01 NOTE — Progress Notes (Addendum)
PROGRESS NOTE    Christopher Snow  ZOX:[id number] DOB: 1940/05/08 DOA: 02/25/2023 PCP: Gracelyn Nurse, MD    Brief Narrative:  82 y.o. male with medical history significant of sCHF with EF 35-405,  HTN, HLD, DM, CAD, CBAG, PVD, dementia, who presents with chest pain.   Per his daughter at bedside, pt started having chest pain since afternoon.  The chest pain is located in left side of chest, radiating to the left arm.  Patient cannot characterize his pain in detail due to dementia.  Patient has mild dry cough.  Patient does not have significant shortness breath, but was found to have oxygen desaturation to 80s on room air in ED, which improved to 92-96% on 4 L of oxygen.  Patient is normally not using oxygen at home.  Patient blood pressure is significantly elevated with SBP 200s, nitroglycerin drip is started in ED.   12/15: Palliative care consult.  Repleting electrolytes, weaning off nitroglycerin drip 12/16: CT chest without contrast, pulmonary and nephro consult   Assessment & Plan:   Principal Problem:   Acute on chronic systolic CHF (congestive heart failure) (HCC) Active Problems:   Acute respiratory failure with hypoxia (HCC)   Hypertensive emergency   CAD (coronary artery disease)   Myocardial injury   HLD (hyperlipidemia)   Diabetes mellitus with peripheral circulatory disorder (HCC)   Hyponatremia   Leukocytosis   Dementia without behavioral disturbance (HCC)  Acute respiratory failure with hypoxia due to acute on chronic systolic CHF (congestive heart failure) (HCC):  Patient is 4 L of new oxygen requirement.  2D echo on 08/27/2018 showed EF of 35-40%.  Patient does not have leg edema,  Initially required only 4 L however oxygen requirements rapidly escalated on 12/13 and patient was placed on heated high flow nasal cannula.  Currently on 12 L HFNC  2D echocardiogram with EF 30 to 35%, previous in 20 2035 to 40%. Plan: Continue oral antihypertensive regimen Lasix  40 mg IV twice daily (received 140 mg total on 12/13).  Pulmonary added torsemide 20 mg once daily. Strict and outs, daily weights, fluid restrict Net IO Since Admission: -4,417.53 mL [03/01/23 1541]  Cardiology following.  He is not a candidate for cardiac catheterization yet considering his high oxygen requirement Consult pulmonary.  Will obtain CT chest without contrast considering significant worsening of hypoxia (new)   Hypertensive emergency:  Initially noted systolic blood pressure in the 200s Plan: Weaned off nitroglycerin drip Continue hydralazine, Coreg, losartan, spironolactone.  Daughter states patient no longer on Entresto.  stop for now pending further cardiology recommendations  CAD (coronary artery disease) Elevated troponins Troponins peaked at 5398 and now trending down at 2647.  Appears to be plateauing.  Suspect supply/demand ischemia in the setting of decompensated heart failure and hypertensive urgency but unable to exclude NSTEMI at this time Plan: Cardiac telemetry Continue Aspirin Statin Cardiology to decide on need for cardiac catheterization  HLD (hyperlipidemia) -Lipitor   Diabetes mellitus with peripheral circulatory disorder [facility]):  Patient is taking metformin, Jardiance and glipizide -Sliding scale insulin   Hyponatremia:  Suspect hypervolemic hyponatremia Plan: Diuresis as above Careful monitoring of serum sodium   Leukocytosis:  WBC 16.2.  No fever, procalcitonin 0.11 on admission No clinical evidence of infection Plan: Hold off antibiotics Follow blood cultures WBC normalized   Dementia without behavioral disturbance (HCC)  Mental status at baseline -Fall precaution  AKI Likely due to diuresis consult nephrology Will obtain renal ultrasound for now Reviewing his old record -  I noticed CT abdomen and pelvis in November 2009 showing right renal mass and some mention of bladder mass.  I discussed with his daughter but she does not  recall anything about this Depending on renal ultrasound results, we may need repeat CT abdomen and pelvis to evaluate this   DVT prophylaxis: Lovenox Code Status: Full Family Communication: Daughter at bedside and updated Disposition Plan: Status is: Inpatient Remains inpatient appropriate because: Acute on chronic systolic congestive heart failure, hypertensive urgency, elevated troponins   Level of care: Progressive  Consultants:  Cardiology Pulmonary    Subjective:  Remains comfortable, in a good mood.  Oxygen requirement is at 12 L high flow nasal cannula.  Daughter at bedside Objective: Vitals:   03/01/23 0537 03/01/23 0600 03/01/23 0738 03/01/23 1345  BP:  (!) 156/73    Pulse:  93 73 72  Resp:  20 (!) 22 (!) 25  Temp: 98.3 F (36.8 C)     TempSrc:      SpO2:  93% 93% 92%  Weight:      Height:        Intake/Output Summary (Last 24 hours) at 03/01/2023 1539 Last data filed at 03/01/2023 0600 Gross per 24 hour  Intake 996.08 ml  Output 1200 ml  Net -203.92 ml   Filed Weights   02/25/23 1736 02/28/23 0500 03/01/23 0500  Weight: 83.9 kg 76.7 kg 76.4 kg    Examination:  General exam: NAD Respiratory system: Crackles and rhonchi at bases.  Improved air entry.  Normal work of breathing.  12 L HFNC Cardiovascular system: S1-S2, RRR, no murmurs, no pedal edema Gastrointestinal system: Soft, NT/ND, normal bowel sounds Central nervous system: Alert and oriented. No focal neurological deficits. Extremities: Symmetric 5 x 5 power. Skin: No rashes, lesions or ulcers Psychiatry: Judgement and insight appear normal. Mood & affect appropriate.     Data Reviewed: I have personally reviewed following labs and imaging studies  CBC: Recent Labs  Lab 02/25/23 1739 02/26/23 0512 02/27/23 0152 02/28/23 0434 03/01/23 0608  WBC 16.2* 13.5* 13.0* 10.6* 9.0  NEUTROABS  --   --  10.1*  --   --   HGB 11.7* 10.5* 10.7* 10.1* 10.9*  HCT 38.1* 33.1* 33.4* 32.1* 34.4*   MCV 75.0* 74.5* 72.9* 73.6* 72.9*  PLT 243 192 195 196 260   Basic Metabolic Panel: Recent Labs  Lab 02/25/23 1739 02/25/23 2355 02/26/23 0512 02/27/23 0152 02/28/23 0434 03/01/23 0608  NA 128* 129* 127* 128* 132* 130*  K 3.9 4.4 4.1 3.5 3.2* 4.0  CL 95* 96* 94* 93* 97* 95*  CO2 19* 20* 20* 22 24 23   GLUCOSE 146* 256* 247* 154* 147* 175*  BUN 24* 26* 26* 28* 38* 48*  CREATININE 1.18 1.28* 1.26* 1.45* 1.52* 1.72*  CALCIUM 8.9 8.6* 8.4* 8.1* 8.0* 8.5*  MG 1.9  --  1.8 2.1 2.1 2.6*  PHOS  --   --   --   --   --  4.1   GFR: Estimated Creatinine Clearance: 34.5 mL/min (A) (by C-G formula based on SCr of 1.72 mg/dL (H)). Liver Function Tests: Recent Labs  Lab 02/25/23 2139  AST 26  ALT 20  ALKPHOS 70  BILITOT 1.1  PROT 7.4  ALBUMIN 4.3   No results for input(s): "LIPASE", "AMYLASE" in the last 168 hours. No results for input(s): "AMMONIA" in the last 168 hours. Coagulation Profile: Recent Labs  Lab 02/25/23 1739 02/26/23 1700  INR 1.2 1.3*    CBG: Recent  Labs  Lab 02/28/23 0926 02/28/23 1241 02/28/23 1718 02/28/23 2059 03/01/23 1305  GLUCAP 152* 264* 155* 178* 241*    Sepsis Labs: Recent Labs  Lab 02/25/23 1739 02/25/23 2209 02/25/23 2355  PROCALCITON 0.11  --   --   LATICACIDVEN  --  1.5 1.5    Recent Results (from the past 240 hours)  Resp panel by RT-PCR (RSV, Flu A&B, Covid) Anterior Nasal Swab     Status: None   Collection Time: 02/25/23 10:09 PM   Specimen: Anterior Nasal Swab  Result Value Ref Range Status   SARS Coronavirus 2 by RT PCR NEGATIVE NEGATIVE Final    Comment: (NOTE) SARS-CoV-2 target nucleic acids are NOT DETECTED.  The SARS-CoV-2 RNA is generally detectable in upper respiratory specimens during the acute phase of infection. The lowest concentration of SARS-CoV-2 viral copies this assay can detect is 138 copies/mL. A negative result does not preclude SARS-Cov-2 infection and should not be used as the sole basis for  treatment or other patient management decisions. A negative result may occur with  improper specimen collection/handling, submission of specimen other than nasopharyngeal swab, presence of viral mutation(s) within the areas targeted by this assay, and inadequate number of viral copies(<138 copies/mL). A negative result must be combined with clinical observations, patient history, and epidemiological information. The expected result is Negative.  Fact Sheet for Patients:  BloggerCourse.com  Fact Sheet for Healthcare Providers:  SeriousBroker.it  This test is no t yet approved or cleared by the [place] FDA and  has been authorized for detection and/or diagnosis of SARS-CoV-2 by FDA under an Emergency Use Authorization (EUA). This EUA will remain  in effect (meaning this test can be used) for the duration of the COVID-19 declaration under Section 564(b)(1) of the Act, 21 U.S.C.section 360bbb-3(b)(1), unless the authorization is terminated  or revoked sooner.       Influenza A by PCR NEGATIVE NEGATIVE Final   Influenza B by PCR NEGATIVE NEGATIVE Final    Comment: (NOTE) The Xpert Xpress SARS-CoV-2/FLU/RSV plus assay is intended as an aid in the diagnosis of influenza from Nasopharyngeal swab specimens and should not be used as a sole basis for treatment. Nasal washings and aspirates are unacceptable for Xpert Xpress SARS-CoV-2/FLU/RSV testing.  Fact Sheet for Patients: BloggerCourse.com  Fact Sheet for Healthcare Providers: SeriousBroker.it  This test is not yet approved or cleared by the [place] FDA and has been authorized for detection and/or diagnosis of SARS-CoV-2 by FDA under an Emergency Use Authorization (EUA). This EUA will remain in effect (meaning this test can be used) for the duration of the COVID-19 declaration under Section 564(b)(1) of the Act, 21  U.S.C. section 360bbb-3(b)(1), unless the authorization is terminated or revoked.     Resp Syncytial Virus by PCR NEGATIVE NEGATIVE Final    Comment: (NOTE) Fact Sheet for Patients: BloggerCourse.com  Fact Sheet for Healthcare Providers: SeriousBroker.it  This test is not yet approved or cleared by the [place] FDA and has been authorized for detection and/or diagnosis of SARS-CoV-2 by FDA under an Emergency Use Authorization (EUA). This EUA will remain in effect (meaning this test can be used) for the duration of the COVID-19 declaration under Section 564(b)(1) of the Act, 21 U.S.C. section 360bbb-3(b)(1), unless the authorization is terminated or revoked.  Performed at [facility], [address] [place], [place] [address]   Blood Culture (routine x 2)     Status: None (Preliminary result)   Collection Time: 02/25/23 10:10  PM   Specimen: BLOOD  Result Value Ref Range Status   Specimen Description BLOOD LEFT ASSIST CONTROL  Final   Special Requests   Final    BOTTLES DRAWN AEROBIC AND ANAEROBIC Blood Culture adequate volume   Culture   Final    NO GROWTH 4 DAYS Performed at [facility], [address] [place], [place] [address]    Report Status PENDING  Incomplete  Blood Culture (routine x 2)     Status: None (Preliminary result)   Collection Time: 02/25/23 10:10 PM   Specimen: BLOOD  Result Value Ref Range Status   Specimen Description BLOOD RIGHT FOREARM  Final   Special Requests   Final    BOTTLES DRAWN AEROBIC AND ANAEROBIC Blood Culture adequate volume   Culture   Final    NO GROWTH 4 DAYS Performed at [facility], [address], [place], [place] [address]    Report Status PENDING  Incomplete         Radiology Studies: No results found.       Scheduled Meds:  aspirin EC  81 mg Oral Daily   atorvastatin  5 mg Oral q1800   carvedilol  6.25 mg Oral BID WC    fluticasone  2 spray Each Nare Daily   hydrALAZINE  50 mg Oral Q8H   insulin aspart  0-5 Units Subcutaneous QHS   insulin aspart  0-9 Units Subcutaneous TID WC   isosorbide mononitrate  60 mg Oral Daily   losartan  25 mg Oral Daily   spironolactone  12.5 mg Oral Daily   torsemide  20 mg Oral Daily   Continuous Infusions:  heparin 1,450 Units/hr (03/01/23 0740)     LOS: 4 days   Time spent 35 minutes  Delfino Lovett, MD [facility]   If 7PM-7AM, please contact night-coverage  03/01/2023, 3:39 PM

## 2023-03-01 NOTE — Consult Note (Signed)
PULMONOLOGY         Date: 03/01/2023,   MRN# [id number] Christopher Snow 1940-05-02     AdmissionWeight: 83.9 kg                 CurrentWeight: 76.4 kg  Referring provider: Dr. Clelia Croft   CHIEF COMPLAINT:   Acute hypoxemic respiratory failure   HISTORY OF PRESENT ILLNESS   This is a pleasant 82 year old male with a history of CHF, diabetes, previous MI, dyslipidemia, essential hypertension came in with hypoxemia.  He also does have a background history of dementia.  On arrival he was noted to be with SpO2 in the 80s and required 4 L of supplemental oxygen to reach saturation of over 92.  He also complained of chest discomfort and dry cough.  At home he has not previously been on oxygen at baseline.  He denied having abdominal or pelvic pain or dysuria.  In the ER he was noted to have accelerated hypertension.  Blood work on arrival showed elevated BNP negative viral workup lactic acid and procalcitonin within reference range renal function was normal troponin had mild elevation with maximum of 110.  He had chest x-ray performed with findings of cardiomegaly, interstitial and airspace opacification with bibasilar atelectasis.  Cardiology and pulmonology has been consulted to evaluate patient with acute on chronic hypoxemic respiratory failure.  He had CT ordered which was not performed quite yet.  He does have left heart cath scheduled however will be postponed till another day due to acute hypoxemia with increased O2 requirement   PAST MEDICAL HISTORY   Past Medical History:  Diagnosis Date   CHF (congestive heart failure) (HCC)    Diabetes mellitus without complication (HCC)    Heart attack (HCC)    Hyperlipidemia    Hypertension      SURGICAL HISTORY   Past Surgical History:  Procedure Laterality Date   APPENDECTOMY     CARDIAC SURGERY     five bypass   CORONARY ARTERY BYPASS GRAFT     HERNIA REPAIR     PR VEIN BYPASS GRAFT,AORTO-FEM-POP       FAMILY  HISTORY   Family History  Problem Relation Age of Onset   Heart disease Mother    Heart disease Father    Diabetes Maternal Grandmother    Diabetes Maternal Grandfather      SOCIAL HISTORY   Social History   Tobacco Use   Smoking status: Former   Smokeless tobacco: Never  Substance Use Topics   Alcohol use: No   Drug use: No     MEDICATIONS    Home Medication:    Current Medication:  Current Facility-Administered Medications:    acetaminophen (TYLENOL) tablet 650 mg, 650 mg, Oral, Q6H PRN, Allena Katz, Kishan S, RPH   albuterol (PROVENTIL) (2.5 MG/3ML) 0.083% nebulizer solution 2.5 mg, 2.5 mg, Nebulization, Q4H PRN, [name], RPH   aspirin EC tablet 81 mg, 81 mg, Oral, Daily, [name], MD, 81 mg at 03/01/23 0936   atorvastatin (LIPITOR) tablet 5 mg, 5 mg, Oral, q1800, [name], MD, 5 mg at 02/28/23 1727   carvedilol (COREG) tablet 12.5 mg, 12.5 mg, Oral, BID WC, [name], [name], MD, 12.5 mg at 03/01/23 0936   dextromethorphan-guaiFENesin (MUCINEX DM) 30-600 MG per 12 hr tablet 1 tablet, 1 tablet, Oral, BID PRN, [name], MD   diphenhydrAMINE (BENADRYL) injection 12.5 mg, 12.5 mg, Intravenous, Q8H PRN, [name], MD, 12.5 mg at 02/26/23 2041  fluticasone (FLONASE) 50 MCG/ACT nasal spray 2 spray, 2 spray, Each Nare, Daily, Sreenath, Sudheer B, MD, 2 spray at 03/01/23 0941   heparin ADULT infusion 100 units/mL (25000 units/255mL), 1,450 Units/hr, Intravenous, Continuous, [name], RPH, Last Rate: 14.5 mL/hr at 03/01/23 0740, 1,450 Units/hr at 03/01/23 0740   hydrALAZINE (APRESOLINE) injection 5 mg, 5 mg, Intravenous, Q2H PRN, [name], MD, 5 mg at 02/26/23 0102   hydrALAZINE (APRESOLINE) tablet 50 mg, 50 mg, Oral, Q8H, [name], MD, 50 mg at 03/01/23 1326   insulin aspart (novoLOG) injection 0-5 Units, 0-5 Units, Subcutaneous, QHS, [name], MD, 3 Units at 02/27/23 2108   insulin aspart (novoLOG) injection 0-9 Units, 0-9 Units, Subcutaneous, TID WC,  [name], MD, 3 Units at 03/01/23 1326   isosorbide mononitrate (IMDUR) 24 hr tablet 60 mg, 60 mg, Oral, Daily, [name], [name], MD, 60 mg at 03/01/23 0936   losartan (COZAAR) tablet 25 mg, 25 mg, Oral, Daily, [name], [name], MD, 25 mg at 03/01/23 0936   morphine (PF) 2 MG/ML injection 1 mg, 1 mg, Intravenous, Q4H PRN, [name], MD, 1 mg at 02/26/23 3474   spironolactone (ALDACTONE) tablet 12.5 mg, 12.5 mg, Oral, Daily, [name], [name], MD, 12.5 mg at 03/01/23 0940    ALLERGIES   Finasteride, Lisinopril, Penicillins, Statins, Tamsulosin, Terazosin, and Pseudoephedrine     REVIEW OF SYSTEMS    Review of Systems:  Gen:  Denies  fever, sweats, chills weigh loss  HEENT: Denies blurred vision, double vision, ear pain, eye pain, hearing loss, nose bleeds, sore throat Cardiac:  No dizziness, chest pain or heaviness, chest tightness,edema Resp:   reports dyspnea chronically  Gi: Denies swallowing difficulty, stomach pain, nausea or vomiting, diarrhea, constipation, bowel incontinence Gu:  Denies bladder incontinence, burning urine Ext:   Denies Joint pain, stiffness or swelling Skin: Denies  skin rash, easy bruising or bleeding or hives Endoc:  Denies polyuria, polydipsia , polyphagia or weight change Psych:   Denies depression, insomnia or hallucinations   Other:  All other systems negative   VS: BP (!) 156/73 (BP Location: Right Arm)   Pulse 73   Temp 98.3 F (36.8 C)   Resp (!) 22   Ht 6\' 3"  (1.905 m)   Wt 76.4 kg   SpO2 93%   BMI 21.05 kg/m      PHYSICAL EXAM    GENERAL:NAD, no fevers, chills, no weakness no fatigue HEAD: Normocephalic, atraumatic.  EYES: Pupils equal, round, reactive to light. Extraocular muscles intact. No scleral icterus.  MOUTH: Moist mucosal membrane. Dentition intact. No abscess noted.  EAR, NOSE, THROAT: Clear without exudates. No external lesions.  NECK: Supple. No thyromegaly. No nodules. No JVD.  PULMONARY: decreased breath  sounds with mild rhonchi worse at bases bilaterally.  CARDIOVASCULAR: S1 and S2. Regular rate and rhythm. No murmurs, rubs, or gallops. No edema. Pedal pulses 2+ bilaterally.  GASTROINTESTINAL: Soft, nontender, nondistended. No masses. Positive bowel sounds. No hepatosplenomegaly.  MUSCULOSKELETAL: No swelling, clubbing, or edema. Range of motion full in all extremities.  NEUROLOGIC: Cranial nerves II through XII are intact. No gross focal neurological deficits. Sensation intact. Reflexes intact.  SKIN: No ulceration, lesions, rashes, or cyanosis. Skin warm and dry. Turgor intact.  PSYCHIATRIC: Mood, affect within normal limits. The patient is awake, alert and oriented x 3. Insight, judgment intact.       IMAGING   Study Result  Narrative & Impression  CLINICAL DATA:  Hypoxia.   EXAM: PORTABLE CHEST  1 VIEW   COMPARISON:  02/25/2023.   FINDINGS: There is diffuse pulmonary vascular congestion, slightly more pronounced than the prior exam. Redemonstration of left retrocardiac airspace opacity obscuring the left hemidiaphragm, descending thoracic aorta and blunting the left lateral costophrenic angle suggesting combination of left lower lobe atelectasis and/or consolidation with pleural effusion. Right lateral costophrenic angle is clear.   Stable cardio-mediastinal silhouette.   There are surgical staples along the heart border and sternotomy wires, status post CABG (coronary artery bypass graft).   No acute osseous abnormalities.   The soft tissues are within normal limits.   IMPRESSION: *Findings favor worsening congestive heart failure/pulmonary edema. *Persistent left retrocardiac opacity, as described above.     Electronically Signed   By: Jules Schick M.D.    ASSESSMENT/PLAN   Acute hypoxemic respiratory failure   - patient denies having fevers of flu like illness   - he does have elevated BNP, but no LE edema.  Will diurese empirically.    - RVP for viral  workup   - CRP and procalcitonin trend    -Torsemide/aldactone combination   - currently on 12l/min O2 nasal canula            Thank you for allowing me to participate in the care of this patient.   Patient/Family are satisfied with care plan and all questions have been answered.    Provider disclosure: Patient with at least one acute or chronic illness or injury that poses a threat to life or bodily function and is being managed actively during this encounter.  All of the below services have been performed independently by signing provider:  review of prior documentation from internal and or external health records.  Review of previous and current lab results.  Interview and comprehensive assessment during patient visit today. Review of current and previous chest radiographs/CT scans. Discussion of management and test interpretation with health care team and patient/family.   This document was prepared using Dragon voice recognition software and may include unintentional dictation errors.     Vida Rigger, M.D.  Division of Pulmonary & Critical Care Medicine

## 2023-03-01 NOTE — Consult Note (Signed)
Pharmacy Consult Note - Anticoagulation  Pharmacy Consult for heparin Indication: chest pain/ACS  PATIENT MEASUREMENTS: Height: 6\' 3"  (190.5 cm) Weight: 76.4 kg (168 lb 6.9 oz) IBW/kg (Calculated) : 84.5 HEPARIN DW (KG): 83.9  VITAL SIGNS: Temp: 97.9 F (36.6 C) (12/16 1900) Temp Source: Oral (12/16 1900) Pulse Rate: 64 (12/16 2013)  Recent Labs    02/28/23 0945 02/28/23 1405 03/01/23 0608 03/01/23 1428 03/01/23 2223  HGB  --   --  10.9*  --   --   HCT  --   --  34.4*  --   --   PLT  --   --  260  --   --   HEPARINUNFRC  --    < > 0.27*   < > 0.54  CREATININE  --   --  1.72*  --   --   TROPONINIHS 2,483*  --   --   --   --    < > = values in this interval not displayed.    Estimated Creatinine Clearance: 34.5 mL/min (A) (by C-G formula based on SCr of 1.72 mg/dL (H)).  PAST MEDICAL HISTORY: Past Medical History:  Diagnosis Date   CHF (congestive heart failure) (HCC)    Diabetes mellitus without complication (HCC)    Heart attack (HCC)    Hyperlipidemia    Hypertension     ASSESSMENT: 82 y.o. male with PMH CHF, HTN, HLD is presenting with chest pain. cTn trending up. Patient is not on chronic anticoagulation per chart review. Pharmacy has been consulted to initiate and manage heparin intravenous infusion.  Pertinent medications: No chronic AC prior to admission per chart review  Goal(s) of therapy: Heparin level 0.3 - 0.7 units/mL Monitor platelets by anticoagulation protocol: Yes   Baseline anticoagulation labs: Recent Labs    02/27/23 0152 02/28/23 0434 03/01/23 0608  HGB 10.7* 10.1* 10.9*  PLT 195 196 260    Date Time aPTT/HL Rate/Comment 12/14 0152 HL 0.46 Therapeutic x 1  12/14 1020 HL 0.28  Subtherapeutic  12/14   2002   HL 0.31            Therapeutic x 1 12/15 0434 HL 0.27  Subtherapeutic 12/15 1405 HL 0.37 12/15 2208 HL 0.30 Therapeutic x 2 12/16 0608 HL 0.27 Subtherapeutic 12/16 1428 HL 0.48 Therapeutic x1 12/16   2223    HL 0.54            Therapeutic X 2   PLAN: Continue heparin infusion at 1450 units/hr.  Recheck heparin level on 12/17 with AM labs.  CBC daily while on heparin.  Thank you for involving pharmacy in this patient's care.   Ledarrius Beauchaine D Clinical Pharmacist 03/01/2023 11:16 PM

## 2023-03-01 NOTE — Progress Notes (Signed)
Patient anxious and agitated this evening. Patient states he has not been able to sleep at night for over 3 days. [place] NP notified Trazodone 50 mg ordered. Given as ordered.

## 2023-03-01 NOTE — Progress Notes (Signed)
Palliative Care Progress Note, Assessment & Plan   Patient Name: Christopher Snow       Date: 03/01/2023 DOB: 1940-11-22  Age: 82 y.o. MRN#: [id number] Attending Physician: Delfino Lovett, MD Primary Care Physician: Gracelyn Nurse, MD Admit Date: 02/25/2023  Subjective: Patient is sitting up in bed in no apparent distress.  He had the blanket draped over his head upon my entrance.  However, he acknowledged my presence, remove the blanket, and is able to make his wishes known.  No family or friends present visit.  HPI: 82 y.o. male  with past medical history of sCHF with EF 30-35% on most recent echo, HTN, HLD, DM, CAD s/p CABG, PVD and dementia admitted from home on 02/25/2023 with chest pain.   Found to be hypoxic in ED, placed on 4L [place] which has now progressed to [facility] which he remains on. BNP 822, CXR revealing vascular congestion-admitted and being treated for acute on chronic CHF with acute hypoxia, possible NSTEMI and hypertensive emergency. Remains on heparin infusion and receiving IV lasix.   Palliative medicine was consulted for assisting with goals of care conversations.  Summary of counseling/coordination of care: Extensive chart review completed prior to meeting patient including labs, vital signs, imaging, progress notes, orders, and available advanced directive documents from current and previous encounters.   After reviewing the patient's chart and assessing the patient at bedside, spoke with patient in regards to symptom management.  He shares he is feeling much better than when he first came to the hospital.  He says there is nothing wrong with him and he is ready to go home.  I highlighted that patient continues to have heparin GTT.  I shared that this is not sustainable outside of the  hospital and will either need to be converted to a pill form or removed from medication - creating one barrier for discharge at this time.  Patient looked surprised by this and said he thought he was going to leave today.  Patient pleasantly confused and unable to participate in goals of care medical decision making independently at this time.    However, he is alert and oriented x 2 and able to participate in symptom assessment.  No adjustment to [facility] needed at this time.  After meeting with the patient, I spoke with his daughter [name] over the phone.  Brief medical update given.  [name] has been in touch with cardiology in regards to plan of care.  She remains in agreement to hold off on left heart cath due to high oxygen requirements.  We reviewed his oxygen requirements have decreased to 12 L nasal cannula but that he continues to need to wean off of this in order to be stable for procedures.  She was appreciative of discussions of his current medical status.  Also reviewed pulmonology's consult and recommendations.  Questions and concerns were addressed.  I attempted to elicit values and goals important to [name].  One goal she shared is that she is hopeful the patient can wean off of oxygen, get a little bit stronger, and be able to return home.  However, she shares her concern that his dementia has progressed.  We discussed dementia as  a chronic, progressive, and irreversible disease that is often exacerbated and accelerated by acute illnesses and hospitalizations.  She shares she wants to give her father the best shot possible to get well but also appreciates that he has several medical issues to overcome.  PMT will continue to follow and support patient throughout his hospitalization.  Physical Exam Vitals reviewed.  Constitutional:      Appearance: He is normal weight.  HENT:     Head: Normocephalic.     Mouth/Throat:     Mouth: Mucous membranes are moist.  Eyes:     Pupils: Pupils are  equal, round, and reactive to light.  Cardiovascular:     Rate and Rhythm: Normal rate. Rhythm irregular.  Skin:    General: Skin is warm and dry.  Neurological:     Mental Status: He is alert.  Psychiatric:        Mood and Affect: Mood normal. Mood is not anxious.        Behavior: Behavior is not agitated.             Total Time 35 minutes   Time spent includes: Detailed review of medical records (labs, imaging, vital signs), medically appropriate exam (mental status, respiratory, cardiac, skin), discussed with treatment team, counseling and educating patient, family and staff, documenting clinical information, medication management and coordination of care.  Samara Deist L. Bonita Quin, DNP, FNP-BC Palliative Medicine Team

## 2023-03-01 NOTE — Progress Notes (Signed)
Consult to HF Navigation Team Placed. Unfortunately, this patient does not meet criteria given his degree of dementia limiting education opportunities. Please feel free to reach out with any specific questions or medication assistance needs.  Thank you for involving the HF Navigation Team in this patient's care.  Enos Fling, PharmD, BCPS Clinical Pharmacist 10/22/2022 12:50 PM

## 2023-03-01 NOTE — TOC Initial Note (Signed)
Transition of Care [facility]) - Initial/Assessment Note    Patient Details  Name: Christopher Snow MRN: [id number] Date of Birth: April 05, 1940  Transition [facility]) CM/SW Contact:    Margarito Liner, LCSW Phone Number: 03/01/2023, 4:11 PM  Clinical Narrative: Readmission prevention screen complete. CSW met with patient but he appeared mildly confused. CSW called his wife, introduced role, and explained that discharge planning would be discussed. PCP is Marcelino Duster, MD. Patient's daughter or son-in-law transport him to appointments. He uses [facility]. No issues obtaining medications. Wife stated they deliver his medications. Patient lives home with wife. Daughter and son-in-law live next door. No home health prior to admission. Patient has a standard walker and 4-prong cane at home. Wife confirmed he is not on oxygen at home. Patient is currently on HFNC 12 L. Will follow for this potential discharge need. No further concerns. CSW encouraged patient's wife to contact CSW as needed. CSW will continue to follow patient and his wife for support and facilitate return home once stable. Daughter or son-in-law will transport him home at discharge.                 Expected Discharge Plan: Home/Self Care Barriers to Discharge: Continued Medical Work up   Patient Goals and CMS Choice            Expected Discharge Plan and Services     Post Acute Care Choice: NA Living arrangements for the past 2 months: Single Family Home                                      Prior Living Arrangements/Services Living arrangements for the past 2 months: Single Family Home Lives with:: Spouse Patient language and need for interpreter reviewed:: Yes Do you feel safe going back to the place where you live?: Yes      Need for Family Participation in Patient Care: Yes (Comment) Care giver support system in place?: Yes (comment) Current home services: DME Criminal Activity/Legal Involvement  Pertinent to Current Situation/Hospitalization: No - Comment as needed  Activities of Daily Living   ADL Screening (condition at time of admission) Independently performs ADLs?: Yes (appropriate for developmental age) Is the patient deaf or have difficulty hearing?: No Does the patient have difficulty seeing, even when wearing glasses/contacts?: No Does the patient have difficulty concentrating, remembering, or making decisions?: No  Permission Sought/Granted Permission sought to share information with : Family Supports    Share Information with NAME: Mearl Kellom     Permission granted to share info w Relationship: Wife  Permission granted to share info w Contact Information: [phone]  Emotional Assessment Appearance:: Appears stated age Attitude/Demeanor/Rapport: Unable to Assess Affect (typically observed): Calm, Pleasant Orientation: : Oriented to Self, Oriented to Place, Oriented to  Time Alcohol / Substance Use: Not Applicable Psych Involvement: No (comment)  Admission diagnosis:  Hypoxia [R09.02] Acute on chronic systolic CHF (congestive heart failure) (HCC) [I50.23] Community acquired pneumonia, unspecified laterality [J18.9] Acute on chronic congestive heart failure, unspecified heart failure type [facility]) [I50.9] Patient Active Problem List   Diagnosis Date Noted   Dementia without behavioral disturbance (HCC) 02/26/2023   Acute on chronic systolic CHF (congestive heart failure) (HCC) 02/25/2023   Acute respiratory failure with hypoxia (HCC) 02/25/2023   Myocardial injury 02/25/2023   HLD (hyperlipidemia) 02/25/2023   Diabetes mellitus with peripheral circulatory disorder (HCC) 02/25/2023   Hyponatremia  02/25/2023   Leukocytosis 02/25/2023   CHF (congestive heart failure) (HCC) 08/26/2018   Pain due to onychomycosis of toenails of both feet 08/25/2018   Coagulation disorder (HCC) 08/25/2018   Hypertensive emergency 11/30/2017   CAD (coronary artery disease)  03/07/2017   Diabetes (HCC) 03/07/2017   Atherosclerosis of native arteries of the extremities with ulceration (HCC) 03/04/2017   Chest pain with high risk for cardiac etiology 03/06/2016   Benign essential hypertension 07/17/2013   CAD (coronary artery disease), native coronary artery 07/17/2013   Controlled type 2 diabetes mellitus without complication, without long-term current use of insulin (HCC) 07/17/2013   Hypertrophy of prostate without urinary obstruction and other lower urinary tract symptoms (LUTS) 07/17/2013   Other hyperlipidemia 07/17/2013   Other psoriasis 07/17/2013   Peripheral vascular disease (HCC) 07/17/2013   PCP:  Gracelyn Nurse, MD Pharmacy:   [facility] - [place], [place] - [address] [place] [address] Phone: [phone] Fax: [phone]     Social Drivers of Health (SDOH) Social History: SDOH Screenings   Food Insecurity: No Food Insecurity (02/26/2023)  Housing: Low Risk  (02/26/2023)  Transportation Needs: No Transportation Needs (02/26/2023)  Utilities: Not At Risk (02/26/2023)  Financial Resource Strain: Low Risk  (04/02/2020)   Received from [facility]  Physical Activity: Insufficiently Active (11/07/2018)  Social Connections: Moderately Integrated (11/07/2018)  Stress: No Stress Concern Present (11/07/2018)  Tobacco Use: Medium Risk (02/26/2023)   SDOH Interventions:     Readmission Risk Interventions    03/01/2023    4:11 PM  Readmission Risk Prevention Plan  Transportation Screening Complete  PCP or Specialist Appt within 3-5 Days Complete  Social Work Consult for Recovery Care Planning/Counseling Complete  Palliative Care Screening Complete  Medication Review Oceanographer) Complete

## 2023-03-01 NOTE — Consult Note (Signed)
Pharmacy Consult Note - Anticoagulation  Pharmacy Consult for heparin Indication: chest pain/ACS  PATIENT MEASUREMENTS: Height: 6\' 3"  (190.5 cm) Weight: 76.7 kg (169 lb 1.5 oz) IBW/kg (Calculated) : 84.5 HEPARIN DW (KG): 83.9  VITAL SIGNS: Temp: 98.3 F (36.8 C) (12/16 0537) BP: 156/73 (12/16 0600) Pulse Rate: 93 (12/16 0600)  Recent Labs    02/26/23 1700 02/26/23 1929 02/28/23 0434 02/28/23 0807 02/28/23 0945 02/28/23 1405 03/01/23 0608  HGB  --    < > 10.1*  --   --   --  10.9*  HCT  --    < > 32.1*  --   --   --  34.4*  PLT  --    < > 196  --   --   --  260  APTT 38*  --   --   --   --   --   --   LABPROT 15.9*  --   --   --   --   --   --   INR 1.3*  --   --   --   --   --   --   HEPARINUNFRC  --    < > 0.27*  --   --    < > 0.27*  CREATININE  --    < > 1.52*  --   --   --   --   TROPONINIHS 1,715*   < >  --    < > 2,483*  --   --    < > = values in this interval not displayed.    Estimated Creatinine Clearance: 39.2 mL/min (A) (by C-G formula based on SCr of 1.52 mg/dL (H)).  PAST MEDICAL HISTORY: Past Medical History:  Diagnosis Date   CHF (congestive heart failure) (HCC)    Diabetes mellitus without complication (HCC)    Heart attack (HCC)    Hyperlipidemia    Hypertension     ASSESSMENT: 82 y.o. male with PMH CHF, HTN, HLD is presenting with chest pain. cTn trending up. Patient is not on chronic anticoagulation per chart review. Pharmacy has been consulted to initiate and manage heparin intravenous infusion.  Pertinent medications: No chronic AC prior to admission per chart review  Goal(s) of therapy: Heparin level 0.3 - 0.7 units/mL Monitor platelets by anticoagulation protocol: Yes   Baseline anticoagulation labs: Recent Labs    02/26/23 1700 02/27/23 0152 02/28/23 0434 03/01/23 0608  APTT 38*  --   --   --   INR 1.3*  --   --   --   HGB  --  10.7* 10.1* 10.9*  PLT  --  195 196 260    Date Time aPTT/HL Rate/Comment 12/14 0152 HL  0.46 Therapeutic x 1  12/14 1020 HL 0.28  Subtherapeutic  12/14   2002   HL 0.31            Therapeutic x 1 12/15 0434 HL 0.27  Subtherapeutic 12/15 1405 HL 0.37 12/15 2208 HL 0.30 Therapeutic x 2 12/16 0608 HL 0.27 Subtherapeutic  PLAN: Bolus 1300 units x 1. Increase heparin infusion at 1450 units/hr. Recheck heparin level in 8 hrs after rate change. CBC daily while on heparin.  [name], PharmD, [facility] 03/01/2023 7:14 AM

## 2023-03-01 NOTE — Progress Notes (Signed)
[facility] CLINIC CARDIOLOGY PROGRESS NOTE   Patient ID: Christopher Snow MRN: [id number] DOB/AGE: [birth year] 82 y.o.  Admit date: 02/25/2023 Referring Physician Dr. Georgeann Oppenheim Primary Physician Gracelyn Nurse, MD  Primary Cardiologist Dr. Darrold Junker Reason for Consultation NSTEMI, AoCHF  HPI: Christopher Snow is a 82 y.o. male with a past medical history of coronary artery disease s/p CABG x5 at [facility] >20 years ago, ICM, abdominal aortic aneurysm s/p repair at [facility], hypertension, type II diabetes who presented to the ED on 02/25/2023 for shortness of breath.   Interval History:  -Patient seen and examined this AM, feeling ok. Difficult historian due to baseline dementia. -Cr uptrending today. Appears euvolemic on exam.  -BP and HR appear stable. Requiring a significant amount of supplemental O2 but reports his SOB is improved.  Review of systems complete and found to be negative unless listed above    Vitals:   03/01/23 0537 03/01/23 0600 03/01/23 0738 03/01/23 1345  BP:  (!) 156/73    Pulse:  93 73 72  Resp:  20 (!) 22 (!) 25  Temp: 98.3 F (36.8 C)     TempSrc:      SpO2:  93% 93% 92%  Weight:      Height:         Intake/Output Summary (Last 24 hours) at 03/01/2023 1433 Last data filed at 03/01/2023 0600 Gross per 24 hour  Intake 996.08 ml  Output 1200 ml  Net -203.92 ml     PHYSICAL EXAM General: Chronically ill appearing elderly male, well nourished, in no acute distress. HEENT: Normocephalic and atraumatic. Neck: No JVD.  Lungs: Normal respiratory effort on 12 L HFNC. Clear bilaterally to auscultation. No wheezes, crackles, rhonchi.  Heart: HRRR. Normal S1 and S2 without gallops or murmurs. Radial & DP pulses 2+ bilaterally. Abdomen: Non-distended appearing.  Msk: Normal strength and tone for age. Extremities: No clubbing, cyanosis or edema.   Neuro: Alert and oriented X 3. Psych: Mood appropriate, affect congruent.    LABS: Basic Metabolic Panel: Recent  Labs    02/28/23 0434 03/01/23 0608  NA 132* 130*  K 3.2* 4.0  CL 97* 95*  CO2 24 23  GLUCOSE 147* 175*  BUN 38* 48*  CREATININE 1.52* 1.72*  CALCIUM 8.0* 8.5*  MG 2.1 2.6*  PHOS  --  4.1   Liver Function Tests: No results for input(s): "AST", "ALT", "ALKPHOS", "BILITOT", "PROT", "ALBUMIN" in the last 72 hours. No results for input(s): "LIPASE", "AMYLASE" in the last 72 hours. CBC: Recent Labs    02/27/23 0152 02/28/23 0434 03/01/23 0608  WBC 13.0* 10.6* 9.0  NEUTROABS 10.1*  --   --   HGB 10.7* 10.1* 10.9*  HCT 33.4* 32.1* 34.4*  MCV 72.9* 73.6* 72.9*  PLT 195 196 260   Cardiac Enzymes: Recent Labs    02/27/23 1449 02/28/23 0807 02/28/23 0945  TROPONINIHS 5,326* 2,647* 2,483*   BNP: No results for input(s): "BNP" in the last 72 hours. D-Dimer: No results for input(s): "DDIMER" in the last 72 hours. Hemoglobin A1C: No results for input(s): "HGBA1C" in the last 72 hours. Fasting Lipid Panel: No results for input(s): "CHOL", "HDL", "LDLCALC", "TRIG", "CHOLHDL", "LDLDIRECT" in the last 72 hours. Thyroid Function Tests: No results for input(s): "TSH", "T4TOTAL", "T3FREE", "THYROIDAB" in the last 72 hours.  Invalid input(s): "FREET3" Anemia Panel: No results for input(s): "VITAMINB12", "FOLATE", "FERRITIN", "TIBC", "IRON", "RETICCTPCT" in the last 72 hours.  No results found.   ECHO 02/26/2023: 1. Left ventricular ejection  fraction, by estimation, is 30 to 35%. The left ventricle has moderate to severely decreased function. The left ventricle demonstrates global hypokinesis. Left ventricular diastolic parameters are indeterminate.   2. Right ventricular systolic function is normal. The right ventricular size is mildly enlarged. There is mildly elevated pulmonary artery systolic pressure.   3. Left atrial size was mildly dilated.   4. The mitral valve is normal in structure. Mild mitral valve regurgitation.   5. Tricuspid valve regurgitation is mild to  moderate.   6. The aortic valve has been repaired/replaced. Aortic valve  regurgitation is not visualized.   7. The inferior vena cava is dilated in size with <50% respiratory  variability, suggesting right atrial pressure of 15 mmHg.   TELEMETRY reviewed by me 03/01/23: sinus rhythm rate 60s  EKG reviewed by me 03/01/23: sinus rhythm rate 80 bpm, nonspecific ST-T changes  DATA reviewed by me 03/01/23: last 24h vitals tele labs imaging I/O, hospitalist progress note  Principal Problem:   Acute on chronic systolic CHF (congestive heart failure) (HCC) Active Problems:   CAD (coronary artery disease)   Hypertensive emergency   Acute respiratory failure with hypoxia (HCC)   Myocardial injury   HLD (hyperlipidemia)   Diabetes mellitus with peripheral circulatory disorder (HCC)   Hyponatremia   Leukocytosis   Dementia without behavioral disturbance (HCC)    ASSESSMENT AND PLAN: Christopher Snow is a 82 y.o. male with a past medical history of coronary artery disease s/p CABG x5 at [facility] >20 years ago, ICM, abdominal aortic aneurysm s/p repair at [facility], hypertension, type II diabetes who presented to the ED on 02/25/2023 for shortness of breath.   # Acute hypoxic respiratory failure Etiology unclear, does not appear significantly volume up on exam. Initially required 4 L now on 12 L HFNC, required up to 50L HFNC overnight. -Further workup per primary team. -Pulm consult.  # Acute on chronic HFrEF # Hypertensive emergency Patient presented with SOB, also noted to be hypertensive with systolics in the 200s. BNP elevated at 822. S/p IV diuresis. -Hold diuresis today as he appears euvolemic and Cr bumped. Consider po tomorrow.  -Continue hydralazine 50 mg tid, carvedilol 6.25 mg twice daily, losartan 25 mg daily, spironolactone 12.5 mg daily.   # NSTEMI # Coronary artery disease # Hyperlipidemia Patient with hx of CAD s/p CABG over 20 years ago. Denies any chest pain on evaluation this  AM. Troponins peaked in the 5000s.  -Consider LHC for additional evaluation however patient not stable to proceed with this at this time given elevated Cr and high O2 requirement.  -Continue heparin gtt.  -Continue imdur 60 mg daily.  -Continue atorvastatin 5 mg daily and aspirin 81 mg daily.   This patient's case was discussed and created with Dr. Corky Sing and he is in agreement.  Signed:  Gale Journey, PA-C  03/01/2023, 2:33 PM [facility]

## 2023-03-01 NOTE — Consult Note (Signed)
PHARMACY CONSULT NOTE - ELECTROLYTES  Pharmacy Consult for Electrolyte Monitoring and Replacement   Recent Labs: Height: 6\' 3"  (190.5 cm) Weight: 76.4 kg (168 lb 6.9 oz) IBW/kg (Calculated) : 84.5 Estimated Creatinine Clearance: 34.5 mL/min (A) (by C-G formula based on SCr of 1.72 mg/dL (H)).  Potassium (mmol/L)  Date Value  03/01/2023 4.0  11/03/2011 4.6   Magnesium (mg/dL)  Date Value  93/23/5573 2.6 (H)   Calcium (mg/dL)  Date Value  22/04/5425 8.5 (L)   Calcium, Total (mg/dL)  Date Value  09/06/7626 8.9   Albumin (g/dL)  Date Value  31/51/7616 4.3   Phosphorus (mg/dL)  Date Value  07/37/1062 4.1   Sodium (mmol/L)  Date Value  03/01/2023 130 (L)  11/03/2011 137   Assessment  Christopher Snow is a 82 y.o. male presenting with chest pain. PMH significant for sCHF with EF 35-40%, HTN, HLD, DM, CAD, CBAG, PVD, and dementia. Possible NSTEMI, troponins peaked at 5398 and now down trending (plateauing). Possible demand ischemia in the setting of decompensated HF and HTN urgency. Pharmacy has been consulted to monitor and replace electrolytes.  Diet: PO MIVF: None Pertinent medications: Lasix 40 mg IV Q12H, spironolactone 12.5/d, losartan 25/d  Goal of Therapy: Electrolytes WNL K = 4.0  Mg = 2.6 Phos = 4.1  Plan:  No electrolyte replacement required at this time Check BMP, Mg, Phos with AM labs  Thank you for allowing pharmacy to be a part of this patient's care.  Effie Shy, PharmD Pharmacy Resident  03/01/2023 7:34 AM

## 2023-03-01 NOTE — Consult Note (Signed)
Pharmacy Consult Note - Anticoagulation  Pharmacy Consult for heparin Indication: chest pain/ACS  PATIENT MEASUREMENTS: Height: 6\' 3"  (190.5 cm) Weight: 76.4 kg (168 lb 6.9 oz) IBW/kg (Calculated) : 84.5 HEPARIN DW (KG): 83.9  VITAL SIGNS: Temp: 98.3 F (36.8 C) (12/16 0537) BP: 156/73 (12/16 0600) Pulse Rate: 72 (12/16 1345)  Recent Labs    02/26/23 1700 02/26/23 1929 02/28/23 0945 02/28/23 1405 03/01/23 0608 03/01/23 1428  HGB  --    < >  --   --  10.9*  --   HCT  --    < >  --   --  34.4*  --   PLT  --    < >  --   --  260  --   APTT 38*  --   --   --   --   --   LABPROT 15.9*  --   --   --   --   --   INR 1.3*  --   --   --   --   --   HEPARINUNFRC  --    < >  --    < > 0.27* 0.48  CREATININE  --    < >  --   --  1.72*  --   TROPONINIHS 1,715*   < > 2,483*  --   --   --    < > = values in this interval not displayed.    Estimated Creatinine Clearance: 34.5 mL/min (A) (by C-G formula based on SCr of 1.72 mg/dL (H)).  PAST MEDICAL HISTORY: Past Medical History:  Diagnosis Date   CHF (congestive heart failure) (HCC)    Diabetes mellitus without complication (HCC)    Heart attack (HCC)    Hyperlipidemia    Hypertension     ASSESSMENT: 82 y.o. male with PMH CHF, HTN, HLD is presenting with chest pain. cTn trending up. Patient is not on chronic anticoagulation per chart review. Pharmacy has been consulted to initiate and manage heparin intravenous infusion.  Pertinent medications: No chronic AC prior to admission per chart review  Goal(s) of therapy: Heparin level 0.3 - 0.7 units/mL Monitor platelets by anticoagulation protocol: Yes   Baseline anticoagulation labs: Recent Labs    02/26/23 1700 02/27/23 0152 02/28/23 0434 03/01/23 0608  APTT 38*  --   --   --   INR 1.3*  --   --   --   HGB  --  10.7* 10.1* 10.9*  PLT  --  195 196 260    Date Time aPTT/HL Rate/Comment 12/14 0152 HL 0.46 Therapeutic x 1  12/14 1020 HL 0.28  Subtherapeutic  12/14    2002   HL 0.31            Therapeutic x 1 12/15 0434 HL 0.27  Subtherapeutic 12/15 1405 HL 0.37 12/15 2208 HL 0.30 Therapeutic x 2 12/16 0608 HL 0.27 Subtherapeutic 12/16 1428 HL 0.48 Therapeutic x1  PLAN: Continue heparin infusion at 1450 units/hr.  Recheck heparin level in 8 hrs  CBC daily while on heparin.  Thank you for involving pharmacy in this patient's care.   Rockwell Alexandria, PharmD Clinical Pharmacist 03/01/2023 4:14 PM

## 2023-03-02 ENCOUNTER — Encounter
Admission: EM | Disposition: A | Discharge status: Hospice | Payer: Self-pay | Source: Home / Self Care | Attending: Internal Medicine

## 2023-03-02 ENCOUNTER — Inpatient Hospital Stay: Payer: No Typology Code available for payment source

## 2023-03-02 ENCOUNTER — Encounter: Payer: Self-pay | Admitting: Internal Medicine

## 2023-03-02 DIAGNOSIS — J9601 Acute respiratory failure with hypoxia: Secondary | ICD-10-CM | POA: Diagnosis not present

## 2023-03-02 DIAGNOSIS — I5023 Acute on chronic systolic (congestive) heart failure: Secondary | ICD-10-CM | POA: Diagnosis not present

## 2023-03-02 DIAGNOSIS — I161 Hypertensive emergency: Secondary | ICD-10-CM | POA: Diagnosis not present

## 2023-03-02 DIAGNOSIS — F0390 Unspecified dementia without behavioral disturbance: Secondary | ICD-10-CM | POA: Diagnosis not present

## 2023-03-02 DIAGNOSIS — J189 Pneumonia, unspecified organism: Secondary | ICD-10-CM | POA: Diagnosis not present

## 2023-03-02 DIAGNOSIS — I509 Heart failure, unspecified: Secondary | ICD-10-CM | POA: Diagnosis not present

## 2023-03-02 DIAGNOSIS — I25119 Atherosclerotic heart disease of native coronary artery with unspecified angina pectoris: Secondary | ICD-10-CM | POA: Diagnosis not present

## 2023-03-02 LAB — CBC
HCT: 30.6 % — ABNORMAL LOW (ref 39.0–52.0)
Hemoglobin: 9.8 g/dL — ABNORMAL LOW (ref 13.0–17.0)
MCH: 23.3 pg — ABNORMAL LOW (ref 26.0–34.0)
MCHC: 32 g/dL (ref 30.0–36.0)
MCV: 72.9 fL — ABNORMAL LOW (ref 80.0–100.0)
Platelets: 257 10*3/uL (ref 150–400)
RBC: 4.2 MIL/uL — ABNORMAL LOW (ref 4.22–5.81)
RDW: 18.7 % — ABNORMAL HIGH (ref 11.5–15.5)
WBC: 9.8 10*3/uL (ref 4.0–10.5)
nRBC: 0 % (ref 0.0–0.2)

## 2023-03-02 LAB — RESPIRATORY PANEL BY PCR

## 2023-03-02 LAB — MAGNESIUM: Magnesium: 2.5 mg/dL — ABNORMAL HIGH (ref 1.7–2.4)

## 2023-03-02 LAB — BASIC METABOLIC PANEL
Anion gap: 12 (ref 5–15)
BUN: 53 mg/dL — ABNORMAL HIGH (ref 8–23)
CO2: 21 mmol/L — ABNORMAL LOW (ref 22–32)
Calcium: 8.3 mg/dL — ABNORMAL LOW (ref 8.9–10.3)
Chloride: 94 mmol/L — ABNORMAL LOW (ref 98–111)
Creatinine, Ser: 1.63 mg/dL — ABNORMAL HIGH (ref 0.61–1.24)
GFR, Estimated: 41 mL/min — ABNORMAL LOW (ref >60)
Glucose, Bld: 253 mg/dL — ABNORMAL HIGH (ref 70–99)
Potassium: 3.9 mmol/L (ref 3.5–5.1)
Sodium: 127 mmol/L — ABNORMAL LOW (ref 135–145)

## 2023-03-02 LAB — CULTURE, BLOOD (ROUTINE X 2)
Culture: NO GROWTH
Culture: NO GROWTH
Special Requests: ADEQUATE
Special Requests: ADEQUATE

## 2023-03-02 LAB — PROCALCITONIN: Procalcitonin: 0.22 ng/mL

## 2023-03-02 LAB — PHOSPHORUS: Phosphorus: 4.4 mg/dL (ref 2.5–4.6)

## 2023-03-02 LAB — GLUCOSE, CAPILLARY
Glucose-Capillary: 223 mg/dL — ABNORMAL HIGH (ref 70–99)
Glucose-Capillary: 213 mg/dL — ABNORMAL HIGH (ref 70–99)
Glucose-Capillary: 225 mg/dL — ABNORMAL HIGH (ref 70–99)
Glucose-Capillary: 225 mg/dL — ABNORMAL HIGH (ref 70–99)

## 2023-03-02 LAB — HEPARIN LEVEL (UNFRACTIONATED): Heparin Unfractionated: 0.58 [IU]/mL (ref 0.30–0.70)

## 2023-03-02 LAB — MRSA NEXT GEN BY PCR, NASAL: MRSA by PCR Next Gen: NOT DETECTED

## 2023-03-02 SURGERY — LEFT HEART CATH AND CORONARY ANGIOGRAPHY
Anesthesia: Moderate Sedation

## 2023-03-02 MED ORDER — TRAZODONE HCL 100 MG PO TABS
100.0000 mg | ORAL_TABLET | Freq: Once | ORAL | Status: AC
  Administered 2023-03-02: 100 mg via ORAL
  Filled 2023-03-02: qty 1

## 2023-03-02 MED ORDER — DEXAMETHASONE SODIUM PHOSPHATE 10 MG/ML IJ SOLN
4.0000 mg | INTRAMUSCULAR | Status: DC
  Administered 2023-03-02 – 2023-03-03 (×2): 4 mg via INTRAVENOUS
  Filled 2023-03-02 (×2): qty 1

## 2023-03-02 MED ORDER — IOHEXOL 300 MG/ML  SOLN
80.0000 mL | Freq: Once | INTRAMUSCULAR | Status: AC | PRN
  Administered 2023-03-02: 80 mL via INTRAVENOUS

## 2023-03-02 MED ORDER — FUROSEMIDE 10 MG/ML IJ SOLN
60.0000 mg | Freq: Two times a day (BID) | INTRAMUSCULAR | Status: DC
  Administered 2023-03-02 – 2023-03-03 (×3): 60 mg via INTRAVENOUS
  Filled 2023-03-02 (×3): qty 6

## 2023-03-02 NOTE — Consult Note (Signed)
[facility]  CONSULT NOTE    Date: 03/02/2023                  Patient Name:  Christopher Snow  MRN: [id number]  DOB: 1941/01/23  Age / Sex: 82 y.o., male         PCP: Gracelyn Nurse, MD                 Service Requesting Consult: [facility]                 Reason for Consult: Acute kidney injury            History of Present Illness: Mr. Christopher Snow is a 82 y.o.  male with past medical history of hypertension, diabetes, CHF, CAD, and dementia, who was admitted to [facility] on 02/25/2023 for Hypoxia [R09.02] Acute on chronic systolic CHF (congestive heart failure) (HCC) [I50.23] Community acquired pneumonia, unspecified laterality [J18.9] Acute on chronic congestive heart failure, unspecified heart failure type [facility]) [I50.9]  Patient presents to the emergency department with complaints of shortness of breath and chest pain.  Patient is seen resting in bed, no family present.  Patient sitting up, completed breakfast tray at bedside.  Heparin drip infusing.  Currently on 7 L high flow nasal cannula.  No lower extremity edema.  Able to speak in complete sentences.  Denies any active chest pain.  Normal renal function noted on admission however has peaked to 1.72 as of yesterday.  Echo completed on 02/26/2023 shows EF 30 to 35%.   Medications: Outpatient medications: Medications Prior to Admission  Medication Sig Dispense Refill Last Dose/Taking   albuterol (VENTOLIN HFA) 108 (90 Base) MCG/ACT inhaler 2 puffs q.i.d. p.r.n. short of breath, wheezing, or cough   Taking   amLODipine (NORVASC) 10 MG tablet Take 10 mg by mouth daily.   Past Week   aspirin EC 81 MG tablet Take 81 mg by mouth daily.   Past Week   atorvastatin (LIPITOR) 10 MG tablet Take 10 mg by mouth daily at 6 PM.   Past Week   furosemide (LASIX) 40 MG tablet Take 1 tablet (40 mg total) by mouth daily. 30 tablet 1 Past Week   glipiZIDE (GLUCOTROL) 5 MG tablet Take 2 tablets by mouth daily.   Past  Week   hydrALAZINE (APRESOLINE) 25 MG tablet Take 25 mg by mouth 3 (three) times daily.   Past Week   isosorbide mononitrate (IMDUR) 30 MG 24 hr tablet TAKE ONE TABLET BY MOUTH ONCE DAILY   Past Week   JARDIANCE 10 MG TABS tablet Take 1 tablet by mouth daily with breakfast.   Past Week   memantine (NAMENDA) 5 MG tablet Take 1 tablet by mouth 2 (two) times daily.   Past Week   metFORMIN (GLUCOPHAGE) 1000 MG tablet Take 1,000 mg by mouth 2 (two) times daily with a meal.   Past Week   sertraline (ZOLOFT) 50 MG tablet Take 1 tablet by mouth daily.   Past Week   carvedilol (COREG) 3.125 MG tablet Take 1 tablet (3.125 mg total) by mouth 2 (two) times daily with a meal. (Patient not taking: Reported on 02/26/2023) 30 tablet 1 Not Taking    Current medications: Current Facility-Administered Medications  Medication Dose Route Frequency Provider Last Rate Last Admin   acetaminophen (TYLENOL) tablet 650 mg  650 mg Oral Q6H PRN Paschal Dopp S, RPH       albuterol (PROVENTIL) (2.5 MG/3ML) 0.083% nebulizer  solution 2.5 mg  2.5 mg Nebulization Q4H PRN [name], RPH       aspirin EC tablet 81 mg  81 mg Oral Daily [name], MD   81 mg at 03/02/23 4010   atorvastatin (LIPITOR) tablet 5 mg  5 mg Oral q1800 [name], MD   5 mg at 03/01/23 1733   carvedilol (COREG) tablet 6.25 mg  6.25 mg Oral BID WC Vida Rigger, MD   6.25 mg at 03/02/23 2725   dexamethasone (DECADRON) injection 4 mg  4 mg Intravenous Q24H Vida Rigger, MD   4 mg at 03/02/23 1132   dextromethorphan-guaiFENesin (MUCINEX DM) 30-600 MG per 12 hr tablet 1 tablet  1 tablet Oral BID PRN [name], MD       fluticasone (FLONASE) 50 MCG/ACT nasal spray 2 spray  2 spray Each Nare Daily Sreenath, Sudheer B, MD   2 spray at 03/02/23 1133   furosemide (LASIX) injection 60 mg  60 mg Intravenous BID Hudson, Caralyn, PA-C   60 mg at 03/02/23 1330   heparin ADULT infusion 100 units/mL (25000 units/288mL)  1,450 Units/hr Intravenous Continuous  [name], RPH 14.5 mL/hr at 03/02/23 1136 1,450 Units/hr at 03/02/23 1136   hydrALAZINE (APRESOLINE) injection 5 mg  5 mg Intravenous Q2H PRN [name], MD   5 mg at 02/26/23 0102   hydrALAZINE (APRESOLINE) tablet 50 mg  50 mg Oral Q8H [name], MD   50 mg at 03/02/23 1330   insulin aspart (novoLOG) injection 0-5 Units  0-5 Units Subcutaneous QHS [name], MD   3 Units at 02/27/23 2108   insulin aspart (novoLOG) injection 0-9 Units  0-9 Units Subcutaneous TID WC [name], MD   3 Units at 03/02/23 1330   isosorbide mononitrate (IMDUR) 24 hr tablet 60 mg  60 mg Oral Daily [name], [name], MD   60 mg at 03/02/23 0927   losartan (COZAAR) tablet 25 mg  25 mg Oral Daily [name], [name], MD   25 mg at 03/02/23 0927   morphine (PF) 2 MG/ML injection 1 mg  1 mg Intravenous Q4H PRN [name], MD   1 mg at 02/26/23 3664   spironolactone (ALDACTONE) tablet 12.5 mg  12.5 mg Oral Daily [name], [name], MD   12.5 mg at 03/02/23 4034      Allergies: Allergies  Allergen Reactions   Finasteride     Christopher Reaction(s): Weakness present   Lisinopril     Christopher Reaction(s): Chest pain   Penicillins    Statins Christopher (See Comments)   Tamsulosin Christopher (See Comments)   Terazosin     Christopher Reaction(s): Orthostatic hypotension   Pseudoephedrine Palpitations      Past Medical History: Past Medical History:  Diagnosis Date   CHF (congestive heart failure) (HCC)    Diabetes mellitus without complication (HCC)    Heart attack (HCC)    Hyperlipidemia    Hypertension      Past Surgical History: Past Surgical History:  Procedure Laterality Date   APPENDECTOMY     CARDIAC SURGERY     five bypass   CORONARY ARTERY BYPASS GRAFT     HERNIA REPAIR     PR VEIN BYPASS GRAFT,AORTO-FEM-POP       Family History: Family History  Problem Relation Age of Onset   Heart disease Mother    Heart disease Father    Diabetes Maternal Grandmother    Diabetes Maternal Grandfather  Social History: Social History   Socioeconomic History   Marital status: Married    Spouse name: Not on file   Number of children: Not on file   Years of education: Not on file   Highest education level: Not on file  Occupational History   Occupation: retired  Tobacco Use   Smoking status: Former   Smokeless tobacco: Never  Substance and Sexual Activity   Alcohol use: No   Drug use: No   Sexual activity: Not on file  Christopher Topics Concern   Not on file  Social History Narrative   Not on file   Social Drivers of Health   Financial Resource Strain: Low Risk  (04/02/2020)   Received from [facility]   Overall Financial Resource Strain (CARDIA)    Difficulty of Paying Living Expenses: Not hard at all  Food Insecurity: No Food Insecurity (02/26/2023)   Hunger Vital Sign    Worried About Running Out of Food in the Last Year: Never true    Ran Out of Food in the Last Year: Never true  Transportation Needs: No Transportation Needs (02/26/2023)   PRAPARE - Administrator, Civil Service (Medical): No    Lack of Transportation (Non-Medical): No  Physical Activity: Insufficiently Active (11/07/2018)   Exercise Vital Sign    Days of Exercise per Week: 3 days    Minutes of Exercise per Session: 40 min  Stress: No Stress Concern Present (11/07/2018)   [employer] of Occupational Health - Occupational Stress Questionnaire    Feeling of Stress : Not at all  Social Connections: Moderately Integrated (11/07/2018)   Social Connection and Isolation Panel [NHANES]    Frequency of Communication with Friends and Family: Three times a week    Frequency of Social Gatherings with Friends and Family: Twice a week    Attends Religious Services: 1 to 4 times per year    Active Member of [employer] or Organizations: No    Attends Banker Meetings: Never    Marital Status: Married  Catering manager Violence: Not At Risk (02/26/2023)   Humiliation, Afraid, Rape,  and Kick questionnaire    Fear of Current or Ex-Partner: No    Emotionally Abused: No    Physically Abused: No    Sexually Abused: No     Review of Systems: Review of Systems  Constitutional:  Negative for chills, fever and malaise/fatigue.  HENT:  Negative for congestion, sore throat and tinnitus.   Eyes:  Negative for blurred vision and redness.  Respiratory:  Positive for shortness of breath. Negative for cough and wheezing.   Cardiovascular:  Positive for chest pain. Negative for palpitations, claudication and leg swelling.  Gastrointestinal:  Negative for abdominal pain, blood in stool, diarrhea, nausea and vomiting.  Genitourinary:  Negative for flank pain, frequency and hematuria.  Musculoskeletal:  Negative for back pain, falls and myalgias.  Skin:  Negative for rash.  Neurological:  Negative for dizziness, weakness and headaches.  Endo/Heme/Allergies:  Does not bruise/bleed easily.  Psychiatric/Behavioral:  Negative for depression. The patient is not nervous/anxious and does not have insomnia.     Vital Signs: Blood pressure (!) 156/67, pulse 75, temperature 97.8 F (36.6 C), temperature source Oral, resp. rate (!) 21, height 6\' 3"  (1.905 m), weight 76.1 kg, SpO2 93%.  Weight trends: Filed Weights   02/28/23 0500 03/01/23 0500 03/02/23 0347  Weight: 76.7 kg 76.4 kg 76.1 kg    Physical Exam: General: NAD  Head: Normocephalic,  atraumatic. Moist oral mucosal membranes  Eyes: Anicteric  Lungs:  Diminished, normal effort, high flow nasal cannula  Heart: Regular rate and rhythm  Abdomen:  Soft, nontender  Extremities: No peripheral edema.  Neurologic: Alert and oriented to self and place, moving all four extremities  Skin: No lesions        Lab results: Basic Metabolic Panel: Recent Labs  Lab 02/26/23 0512 02/27/23 0152 02/28/23 0434 03/01/23 0608 03/02/23 0456  NA 127* 128* 132* 130* 127*  K 4.1 3.5 3.2* 4.0 3.9  CL 94* 93* 97* 95* 94*  CO2 20* 22 24 23   21*  GLUCOSE 247* 154* 147* 175* 253*  BUN 26* 28* 38* 48* 53*  CREATININE 1.26* 1.45* 1.52* 1.72* 1.63*  CALCIUM 8.4* 8.1* 8.0* 8.5* 8.3*  MG 1.8 2.1 2.1 2.6* 2.5*  PHOS  --   --   --  4.1 4.4    Liver Function Tests: Recent Labs  Lab 02/25/23 2139  AST 26  ALT 20  ALKPHOS 70  BILITOT 1.1  PROT 7.4  ALBUMIN 4.3   No results for input(s): "LIPASE", "AMYLASE" in the last 168 hours. No results for input(s): "AMMONIA" in the last 168 hours.  CBC: Recent Labs  Lab 02/26/23 0512 02/27/23 0152 02/28/23 0434 03/01/23 0608 03/02/23 0456  WBC 13.5* 13.0* 10.6* 9.0 9.8  NEUTROABS  --  10.1*  --   --   --   HGB 10.5* 10.7* 10.1* 10.9* 9.8*  HCT 33.1* 33.4* 32.1* 34.4* 30.6*  MCV 74.5* 72.9* 73.6* 72.9* 72.9*  PLT 192 195 196 260 257    Cardiac Enzymes: No results for input(s): "CKTOTAL", "CKMB", "CKMBINDEX", "TROPONINI" in the last 168 hours.  BNP: Invalid input(s): "POCBNP"  CBG: Recent Labs  Lab 03/01/23 1305 03/01/23 1654 03/01/23 2136 03/02/23 0814 03/02/23 1159  GLUCAP 241* 193* 187* 223* 213*    Microbiology: Results for orders placed or performed during the hospital encounter of 02/25/23  Resp panel by RT-PCR (RSV, Flu A&B, Covid) Anterior Nasal Swab     Status: None   Collection Time: 02/25/23 10:09 PM   Specimen: Anterior Nasal Swab  Result Value Ref Range Status   SARS Coronavirus 2 by RT PCR NEGATIVE NEGATIVE Final    Comment: (NOTE) SARS-CoV-2 target nucleic acids are NOT DETECTED.  The SARS-CoV-2 RNA is generally detectable in upper respiratory specimens during the acute phase of infection. The lowest concentration of SARS-CoV-2 viral copies this assay can detect is 138 copies/mL. A negative result does not preclude SARS-Cov-2 infection and should not be used as the sole basis for treatment or Christopher patient management decisions. A negative result may occur with  improper specimen collection/handling, submission of specimen Christopher than  nasopharyngeal swab, presence of viral mutation(s) within the areas targeted by this assay, and inadequate number of viral copies(<138 copies/mL). A negative result must be combined with clinical observations, patient history, and epidemiological information. The expected result is Negative.  Fact Sheet for Patients:  BloggerCourse.com  Fact Sheet for Healthcare Providers:  SeriousBroker.it  This test is no t yet approved or cleared by the [place] FDA and  has been authorized for detection and/or diagnosis of SARS-CoV-2 by FDA under an Emergency Use Authorization (EUA). This EUA will remain  in effect (meaning this test can be used) for the duration of the COVID-19 declaration under Section 564(b)(1) of the Act, 21 U.S.C.section 360bbb-3(b)(1), unless the authorization is terminated  or revoked sooner.       Influenza A  by PCR NEGATIVE NEGATIVE Final   Influenza B by PCR NEGATIVE NEGATIVE Final    Comment: (NOTE) The Xpert Xpress SARS-CoV-2/FLU/RSV plus assay is intended as an aid in the diagnosis of influenza from Nasopharyngeal swab specimens and should not be used as a sole basis for treatment. Nasal washings and aspirates are unacceptable for Xpert Xpress SARS-CoV-2/FLU/RSV testing.  Fact Sheet for Patients: BloggerCourse.com  Fact Sheet for Healthcare Providers: SeriousBroker.it  This test is not yet approved or cleared by the [place] FDA and has been authorized for detection and/or diagnosis of SARS-CoV-2 by FDA under an Emergency Use Authorization (EUA). This EUA will remain in effect (meaning this test can be used) for the duration of the COVID-19 declaration under Section 564(b)(1) of the Act, 21 U.S.C. section 360bbb-3(b)(1), unless the authorization is terminated or revoked.     Resp Syncytial Virus by PCR NEGATIVE NEGATIVE Final    Comment:  (NOTE) Fact Sheet for Patients: BloggerCourse.com  Fact Sheet for Healthcare Providers: SeriousBroker.it  This test is not yet approved or cleared by the [place] FDA and has been authorized for detection and/or diagnosis of SARS-CoV-2 by FDA under an Emergency Use Authorization (EUA). This EUA will remain in effect (meaning this test can be used) for the duration of the COVID-19 declaration under Section 564(b)(1) of the Act, 21 U.S.C. section 360bbb-3(b)(1), unless the authorization is terminated or revoked.  Performed at [facility], [address] [place], [place] [address]   Blood Culture (routine x 2)     Status: None   Collection Time: 02/25/23 10:10 PM   Specimen: BLOOD  Result Value Ref Range Status   Specimen Description BLOOD LEFT ASSIST CONTROL  Final   Special Requests   Final    BOTTLES DRAWN AEROBIC AND ANAEROBIC Blood Culture adequate volume   Culture   Final    NO GROWTH 5 DAYS Performed at [facility], [address] [place], [place] [address]    Report Status 03/02/2023 FINAL  Final  Blood Culture (routine x 2)     Status: None   Collection Time: 02/25/23 10:10 PM   Specimen: BLOOD  Result Value Ref Range Status   Specimen Description BLOOD RIGHT FOREARM  Final   Special Requests   Final    BOTTLES DRAWN AEROBIC AND ANAEROBIC Blood Culture adequate volume   Culture   Final    NO GROWTH 5 DAYS Performed at [facility], [address] [place], [place] [address]    Report Status 03/02/2023 FINAL  Final  MRSA Next Gen by PCR, Nasal     Status: None   Collection Time: 03/02/23  6:00 AM   Specimen: Nasal Mucosa; Nasal Swab  Result Value Ref Range Status   MRSA by PCR Next Gen NOT DETECTED NOT DETECTED Final    Comment: (NOTE) The GeneXpert MRSA Assay (FDA approved for NASAL specimens only), is one component of a comprehensive MRSA colonization surveillance program.  It is not intended to diagnose MRSA infection nor to guide or monitor treatment for MRSA infections. Test performance is not FDA approved in patients less than 67 years old. Performed at [facility], [address] [place], [place] [address]   Respiratory (~20 pathogens) panel by PCR     Status: None   Collection Time: 03/02/23  6:00 AM   Specimen: Nasopharyngeal Swab; Respiratory  Result Value Ref Range Status   Adenovirus NOT DETECTED NOT DETECTED Final   Coronavirus 229E NOT DETECTED NOT  DETECTED Final    Comment: (NOTE) The Coronavirus on the Respiratory Panel, DOES NOT test for the novel  Coronavirus (2019 nCoV)    Coronavirus HKU1 NOT DETECTED NOT DETECTED Final   Coronavirus NL63 NOT DETECTED NOT DETECTED Final   Coronavirus OC43 NOT DETECTED NOT DETECTED Final   Metapneumovirus NOT DETECTED NOT DETECTED Final   Rhinovirus / Enterovirus NOT DETECTED NOT DETECTED Final   Influenza A NOT DETECTED NOT DETECTED Final   Influenza B NOT DETECTED NOT DETECTED Final   Parainfluenza Virus 1 NOT DETECTED NOT DETECTED Final   Parainfluenza Virus 2 NOT DETECTED NOT DETECTED Final   Parainfluenza Virus 3 NOT DETECTED NOT DETECTED Final   Parainfluenza Virus 4 NOT DETECTED NOT DETECTED Final   Respiratory Syncytial Virus NOT DETECTED NOT DETECTED Final   Bordetella pertussis NOT DETECTED NOT DETECTED Final   Bordetella Parapertussis NOT DETECTED NOT DETECTED Final   Chlamydophila pneumoniae NOT DETECTED NOT DETECTED Final   Mycoplasma pneumoniae NOT DETECTED NOT DETECTED Final    Comment: Performed at [facility], [address], [place], [place] [address]    Coagulation Studies: No results for input(s): "LABPROT", "INR" in the last 72 hours.  Urinalysis: No results for input(s): "COLORURINE", "LABSPEC", "PHURINE", "GLUCOSEU", "HGBUR", "BILIRUBINUR", "KETONESUR", "PROTEINUR", "UROBILINOGEN", "NITRITE", "LEUKOCYTESUR" in the last 72 hours.  Invalid input(s):  "APPERANCEUR"    Imaging: CT CHEST WO CONTRAST Result Date: 03/01/2023 CLINICAL DATA:  Dyspnea hypoxemia EXAM: CT CHEST WITHOUT CONTRAST TECHNIQUE: Multidetector CT imaging of the chest was performed following the standard protocol without IV contrast. RADIATION DOSE REDUCTION: This exam was performed according to the departmental dose-optimization program which includes automated exposure control, adjustment of the mA and/or kV according to patient size and/or use of iterative reconstruction technique. COMPARISON:  Chest x-ray 02/26/2023, CT 04/16/2009 FINDINGS: Cardiovascular: Advanced aortic atherosclerosis. No aneurysm. Post CABG changes. Coronary vascular calcification. Mild cardiomegaly. No pericardial effusion Mediastinum/Nodes: Patent trachea. No suspicious thyroid mass. Borderline right paratracheal node measuring 10 mm. Subcarinal lymph node slightly enlarged measuring 19 mm. Esophagus within normal limits. Lungs/Pleura: Moderate bilateral pleural effusions. Emphysema. Partial bilateral lower lobe consolidations. Septal thickening with mild ground-glass density in the upper lobes. Calcified granuloma at the right middle lobe. 7 mm left upper lobe pulmonary nodule, series 4, image 58. Upper Abdomen: No acute finding. Gallstones. Rim calcified complex cyst upper pole right kidney, present since 2011 though with slight increase peripheral calcification, no imaging follow-up is recommended. Slight interval enlargement slightly dense right midpole lesion, measuring 3.6 cm, compared with 2.8 cm previously. Musculoskeletal: Post sternotomy changes. No acute or suspicious osseous abnormality. IMPRESSION: 1. Moderate bilateral pleural effusions with partial bilateral lower lobe consolidations, either representing atelectasis or pneumonia. Septal thickening with mild ground-glass density in the upper lobes suggestive of edema. 2. Emphysema. 7 mm left upper lobe pulmonary nodule. Non-contrast chest CT at 6-12  months is recommended. If the nodule is stable at time of repeat CT, then future CT at 18-24 months (from today's scan) is considered optional for low-risk patients, but is recommended for high-risk patients. This recommendation follows the consensus statement: Guidelines for Management of Incidental Pulmonary Nodules Detected on CT Images: From the Fleischner Society 2017; Radiology 2017; 284:228-243. 3. Slight interval enlargement of slightly dense right midpole renal lesion, measuring 3.6 cm, compared with 2.8 cm previously. When the patient is clinically stable and able to follow directions and hold their breath (preferably as an outpatient) further evaluation with dedicated abdominal MRI should be considered. 4.  Gallstones 5. Aortic atherosclerosis. Aortic Atherosclerosis (ICD10-I70.0) and Emphysema (ICD10-J43.9). Electronically Signed   By: Jasmine Pang M.D.   On: 03/01/2023 17:23     Assessment & Plan: Mr. Jourdon Laflash is a 82 y.o.  male with  past medical history of hypertension, diabetes, CHF, CAD, and dementia, who was admitted to [facility] on 02/25/2023 for Hypoxia [R09.02] Acute on chronic systolic CHF (congestive heart failure) (HCC) [I50.23] Community acquired pneumonia, unspecified laterality [J18.9] Acute on chronic congestive heart failure, unspecified heart failure type (HCC) [I50.9]  Acute kidney injury likely secondary to cardiorenal and diuresis.  Normal function noted on admission.  Elevated troponins noted.  Creatinine peaked at 1.72, now 1.63.  IV contrast exposure today with CT scan.  Cardiology deferring LHC due to decreased renal function. Awaiting CT abd. No acute indication for dialysis. Will continue to monitor renal indices and urine output.   2. Hypertension emergency. Blood pressure critically elevated on ED arrival.  Home regimen includes amlodipine, hydralazine, isosorbide, carvedilol, and furosemide.  Amlodipine currently he will.  Receiving the above-mentioned  medications along with losartan and spironolactone.  3.  Hyponatremia likely secondary to hypervolemia.  Currently diuresing with furosemide and spironolactone.   LOS: 5 Ranae Casebier 12/17/20243:28 PM

## 2023-03-02 NOTE — Consult Note (Signed)
Pharmacy Consult Note - Anticoagulation  Pharmacy Consult for heparin Indication: chest pain/ACS  PATIENT MEASUREMENTS: Height: 6\' 3"  (190.5 cm) Weight: 76.1 kg (167 lb 12.3 oz) IBW/kg (Calculated) : 84.5 HEPARIN DW (KG): 83.9  VITAL SIGNS: Temp: 97.8 F (36.6 C) (12/17 0339) Temp Source: Oral (12/17 0339) BP: 136/72 (12/17 0339) Pulse Rate: 75 (12/17 0000)  Recent Labs    02/28/23 0945 02/28/23 1405 03/01/23 0608 03/01/23 1428 03/02/23 0456  HGB  --   --  10.9*  --  9.8*  HCT  --   --  34.4*  --  30.6*  PLT  --   --  260  --  257  HEPARINUNFRC  --    < > 0.27*   < > 0.58  CREATININE  --   --  1.72*  --   --   TROPONINIHS 2,483*  --   --   --   --    < > = values in this interval not displayed.    Estimated Creatinine Clearance: 34.4 mL/min (A) (by C-G formula based on SCr of 1.72 mg/dL (H)).  PAST MEDICAL HISTORY: Past Medical History:  Diagnosis Date   CHF (congestive heart failure) (HCC)    Diabetes mellitus without complication (HCC)    Heart attack (HCC)    Hyperlipidemia    Hypertension     ASSESSMENT: 82 y.o. male with PMH CHF, HTN, HLD is presenting with chest pain. cTn trending up. Patient is not on chronic anticoagulation per chart review. Pharmacy has been consulted to initiate and manage heparin intravenous infusion.  Pertinent medications: No chronic AC prior to admission per chart review  Goal(s) of therapy: Heparin level 0.3 - 0.7 units/mL Monitor platelets by anticoagulation protocol: Yes   Baseline anticoagulation labs: Recent Labs    02/28/23 0434 03/01/23 0608 03/02/23 0456  HGB 10.1* 10.9* 9.8*  PLT 196 260 257    Date Time aPTT/HL Rate/Comment 12/14 0152 HL 0.46 Therapeutic x 1  12/14 1020 HL 0.28  Subtherapeutic  12/14   2002   HL 0.31            Therapeutic x 1 12/15 0434 HL 0.27  Subtherapeutic 12/15 1405 HL 0.37 12/15 2208 HL 0.30 Therapeutic x 2 12/16 0608 HL 0.27 Subtherapeutic 12/16 1428 HL 0.48 Therapeutic  x1 12/16   2223    HL 0.54           Therapeutic X 2  12/17   0456    HL 0.58           Therapeutic X 3   PLAN: Continue heparin infusion at 1450 units/hr.  Recheck heparin level on 12/18 with AM labs.  CBC daily while on heparin.  Thank you for involving pharmacy in this patient's care.   Jackquline Branca D Clinical Pharmacist 03/02/2023 6:43 AM

## 2023-03-02 NOTE — Progress Notes (Signed)
Palliative Care Progress Note, Assessment & Plan   Patient Name: Christopher Snow       Date: 03/02/2023 DOB: 06/08/1940  Age: 82 y.o. MRN#: [id number] Attending Physician: Delfino Lovett, MD Primary Care Physician: Gracelyn Nurse, MD Admit Date: 02/25/2023  Subjective: Patient is sitting up in bed, awake, alert, and in no apparent distress.  He shares that the providers that have met with him this morning have told him that he is doing really well and improving.  He shows me how well he can perform with his incentive spirometer (1200).  No family or friends present during my visit.  HPI: 82 y.o. male  with past medical history of sCHF with EF 30-35% on most recent echo, HTN, HLD, DM, CAD s/p CABG, PVD and dementia admitted from home on 02/25/2023 with chest pain.   Found to be hypoxic in ED, placed on 4L [place] which has now progressed to [facility] which he remains on. BNP 822, CXR revealing vascular congestion-admitted and being treated for acute on chronic CHF with acute hypoxia, possible NSTEMI and hypertensive emergency. Remains on heparin infusion and receiving IV lasix.   Palliative medicine was consulted for assisting with goals of care conversations.  Summary of counseling/coordination of care: Extensive chart review completed prior to meeting patient including labs, vital signs, imaging, progress notes, orders, and available advanced directive documents from current and previous encounters.   After reviewing the patient's chart and assessing the patient at bedside, I spoke with patient in regards to plan of care.  He is unable to participate in goals of care discussions independently at this time.  However, he is able to participate in his symptom/pain assessment.  He denies acute complaints at this time.   No adjustment to [facility] needed.  Outlined that we are going to scan his abdomen with a CT and ultrasound his bladder to ensure there are no further problems that we need to investigate.  He says that sounds fine.  He is appreciative of the help that he has been offered so far.  He shook my hand and told me that he left me.  CT and [place] pending.  PMT remains available to patient and family throughout his hospitalization and will continue to follow and support.  Physical Exam Vitals reviewed.  Constitutional:      General: He is not in acute distress.    Appearance: He is normal weight.  HENT:     Head: Normocephalic.  Eyes:     Pupils: Pupils are equal, round, and reactive to light.  Pulmonary:     Breath sounds: Examination of the right-lower field reveals decreased breath sounds. Examination of the left-lower field reveals decreased breath sounds. Decreased breath sounds present.  Neurological:     Mental Status: He is alert.     Comments: Oriented to self and place  Psychiatric:        Mood and Affect: Mood normal. Mood is not anxious.        Behavior: Behavior normal. Behavior is not agitated.             Total Time 25 minutes   Time spent includes: Detailed review of medical records (labs, imaging,  vital signs), medically appropriate exam (mental status, respiratory, cardiac, skin), discussed with treatment team, counseling and educating patient, family and staff, documenting clinical information, medication management and coordination of care.  Samara Deist L. Bonita Quin, DNP, FNP-BC Palliative Medicine Team

## 2023-03-02 NOTE — Progress Notes (Addendum)
PROGRESS NOTE    Christopher Snow  JYN:[id number] DOB: 07-01-1940 DOA: 02/25/2023 PCP: Gracelyn Nurse, MD    Brief Narrative:  82 y.o. male with medical history significant of sCHF with EF 35-405,  HTN, HLD, DM, CAD, CBAG, PVD, dementia, who presents with chest pain.   Per his daughter at bedside, pt started having chest pain since afternoon.  The chest pain is located in left side of chest, radiating to the left arm.  Patient cannot characterize his pain in detail due to dementia.  Patient has mild dry cough.  Patient does not have significant shortness breath, but was found to have oxygen desaturation to 80s on room air in ED, which improved to 92-96% on 4 L of oxygen.  Patient is normally not using oxygen at home.  Patient blood pressure is significantly elevated with SBP 200s, nitroglycerin drip is started in ED.   12/15: Palliative care consult.  Repleting electrolytes, weaning off nitroglycerin drip 12/16: CT chest without contrast, pulmonary and nephro consult 12/17: CT abdomen and pelvis for further evaluation renal mass.  Renal ultrasound pending, Decadron started   Assessment & Plan:   Principal Problem:   Acute on chronic systolic CHF (congestive heart failure) (HCC) Active Problems:   Acute respiratory failure with hypoxia (HCC)   Hypertensive emergency   CAD (coronary artery disease)   Myocardial injury   HLD (hyperlipidemia)   Diabetes mellitus with peripheral circulatory disorder (HCC)   Hyponatremia   Leukocytosis   Dementia without behavioral disturbance (HCC)  Acute respiratory failure with hypoxia due to acute on chronic systolic CHF (congestive heart failure) (HCC):  Patient is 4 L of new oxygen requirement.  2D echo on 08/27/2018 showed EF of 35-40%.  Patient does not have leg edema,  Initially required only 4 L however oxygen requirements rapidly escalated on 12/13 and patient was placed on heated high flow nasal cannula.  Remains on 12 L HFNC  2D  echocardiogram with EF 30 to 35%, previous in 20 2035 to 40%. Continue oral antihypertensive regimen Lasix 60 mg IV twice daily per cardiology continue spironolactone.  CT showing bilateral pleural effusion Strict and outs, daily weights, fluid restrict Net IO Since Admission: -4,365.92 mL [03/02/23 1032]  Cardiology following.  He is not a candidate for cardiac catheterization yet considering his high oxygen requirement Pulmonary following -CT chest showing moderate bilateral pleural effusion with partial bilateral lower lobe consolidation.  Enlargement of slightly dense right midpole renal lesion measuring 3.6 cm   Hypertensive emergency:  Initially noted systolic blood pressure in the 200s Weaned off nitroglycerin drip Continue hydralazine, Coreg, losartan, spironolactone.  Daughter states patient no longer on Entresto.  stop for now pending further cardiology recommendations  CAD (coronary artery disease) Elevated troponins Troponins peaked at 5398 and now trending down at 2647.  Appears to be plateauing.  Suspect supply/demand ischemia in the setting of decompensated heart failure and hypertensive urgency but unable to exclude NSTEMI at this time Monitor on telemetry Continue Aspirin, Statin,  Cardiology to decide on need for cardiac catheterization.  Deferred for now due to his significant hypoxia  HLD (hyperlipidemia) -Lipitor   Diabetes mellitus with peripheral circulatory disorder [facility]):  Patient is taking metformin, Jardiance and glipizide -Sliding scale insulin   Hyponatremia:  Suspect hypervolemic hyponatremia Careful monitoring of serum sodium   Leukocytosis:  WBC 16.2.  No fever, procalcitonin 0.11 on admission No clinical evidence of infection Hold off antibiotics Follow blood cultures WBC normalized   Dementia without behavioral  disturbance (HCC)  Mental status at baseline.  He did have agitation last night and seems somewhat confused this morning. -Fall  precaution  AKI Likely due to diuresis consult nephrology Will obtain renal ultrasound for now Reviewing his old record -I noticed CT abdomen and pelvis in November 2009 showing right renal mass and some mention of bladder mass.  CT chest from yesterday does show enlargement of renal mass.  I discussed with his daughter but she does not recall anything about this Will obtain CT abdomen and pelvis to evaluate this as he could not do MRI which was ordered last night   DVT prophylaxis: Lovenox Code Status: Full Family Communication: Daughter at bedside and updated Disposition Plan: Status is: Inpatient Remains inpatient appropriate because: Acute on chronic systolic congestive heart failure, hypertensive urgency, elevated troponins   Level of care: Progressive  Consultants:  Cardiology Pulmonary    Subjective:  He had agitation overnight and could not get MRI done.  He seems somewhat confused this morning.  Oxygen requirement is at 12 L high flow nasal cannula.    Objective: Vitals:   03/02/23 0300 03/02/23 0339 03/02/23 0347 03/02/23 0816  BP:  136/72  (!) 156/67  Pulse:      Resp: (!) 21 (!) 21    Temp:  97.8 F (36.6 C)  97.8 F (36.6 C)  TempSrc:  Oral  Oral  SpO2:      Weight:   76.1 kg   Height:        Intake/Output Summary (Last 24 hours) at 03/02/2023 1032 Last data filed at 03/02/2023 0517 Gross per 24 hour  Intake 551.61 ml  Output 500 ml  Net 51.61 ml   Filed Weights   02/28/23 0500 03/01/23 0500 03/02/23 0347  Weight: 76.7 kg 76.4 kg 76.1 kg    Examination:  General exam: NAD Respiratory system: Crackles and rhonchi at bases.  Improved air entry.  Normal work of breathing.  12 L HFNC Cardiovascular system: S1-S2, RRR, no murmurs, no pedal edema Gastrointestinal system: Soft, NT/ND, normal bowel sounds Central nervous system: Alert and pleasantly confused no focal neurological deficits. Extremities: Symmetric 5 x 5 power. Skin: No rashes,  lesions or ulcers Psychiatry: Judgement and insight appear normal. Mood & affect appropriate.     Data Reviewed: I have personally reviewed following labs and imaging studies  CBC: Recent Labs  Lab 02/26/23 0512 02/27/23 0152 02/28/23 0434 03/01/23 0608 03/02/23 0456  WBC 13.5* 13.0* 10.6* 9.0 9.8  NEUTROABS  --  10.1*  --   --   --   HGB 10.5* 10.7* 10.1* 10.9* 9.8*  HCT 33.1* 33.4* 32.1* 34.4* 30.6*  MCV 74.5* 72.9* 73.6* 72.9* 72.9*  PLT 192 195 196 260 257   Basic Metabolic Panel: Recent Labs  Lab 02/26/23 0512 02/27/23 0152 02/28/23 0434 03/01/23 0608 03/02/23 0456  NA 127* 128* 132* 130* 127*  K 4.1 3.5 3.2* 4.0 3.9  CL 94* 93* 97* 95* 94*  CO2 20* 22 24 23  21*  GLUCOSE 247* 154* 147* 175* 253*  BUN 26* 28* 38* 48* 53*  CREATININE 1.26* 1.45* 1.52* 1.72* 1.63*  CALCIUM 8.4* 8.1* 8.0* 8.5* 8.3*  MG 1.8 2.1 2.1 2.6* 2.5*  PHOS  --   --   --  4.1 4.4   GFR: Estimated Creatinine Clearance: 36.3 mL/min (A) (by C-G formula based on SCr of 1.63 mg/dL (H)). Liver Function Tests: Recent Labs  Lab 02/25/23 2139  AST 26  ALT 20  ALKPHOS  70  BILITOT 1.1  PROT 7.4  ALBUMIN 4.3   No results for input(s): "LIPASE", "AMYLASE" in the last 168 hours. No results for input(s): "AMMONIA" in the last 168 hours. Coagulation Profile: Recent Labs  Lab 02/25/23 1739 02/26/23 1700  INR 1.2 1.3*    CBG: Recent Labs  Lab 02/28/23 2059 03/01/23 1305 03/01/23 1654 03/01/23 2136 03/02/23 0814  GLUCAP 178* 241* 193* 187* 223*    Sepsis Labs: Recent Labs  Lab 02/25/23 1739 02/25/23 2209 02/25/23 2355 03/02/23 0456  PROCALCITON 0.11  --   --  0.22  LATICACIDVEN  --  1.5 1.5  --     Recent Results (from the past 240 hours)  Resp panel by RT-PCR (RSV, Flu A&B, Covid) Anterior Nasal Swab     Status: None   Collection Time: 02/25/23 10:09 PM   Specimen: Anterior Nasal Swab  Result Value Ref Range Status   SARS Coronavirus 2 by RT PCR NEGATIVE NEGATIVE Final     Comment: (NOTE) SARS-CoV-2 target nucleic acids are NOT DETECTED.  The SARS-CoV-2 RNA is generally detectable in upper respiratory specimens during the acute phase of infection. The lowest concentration of SARS-CoV-2 viral copies this assay can detect is 138 copies/mL. A negative result does not preclude SARS-Cov-2 infection and should not be used as the sole basis for treatment or other patient management decisions. A negative result may occur with  improper specimen collection/handling, submission of specimen other than nasopharyngeal swab, presence of viral mutation(s) within the areas targeted by this assay, and inadequate number of viral copies(<138 copies/mL). A negative result must be combined with clinical observations, patient history, and epidemiological information. The expected result is Negative.  Fact Sheet for Patients:  BloggerCourse.com  Fact Sheet for Healthcare Providers:  SeriousBroker.it  This test is no t yet approved or cleared by the [place] FDA and  has been authorized for detection and/or diagnosis of SARS-CoV-2 by FDA under an Emergency Use Authorization (EUA). This EUA will remain  in effect (meaning this test can be used) for the duration of the COVID-19 declaration under Section 564(b)(1) of the Act, 21 U.S.C.section 360bbb-3(b)(1), unless the authorization is terminated  or revoked sooner.       Influenza A by PCR NEGATIVE NEGATIVE Final   Influenza B by PCR NEGATIVE NEGATIVE Final    Comment: (NOTE) The Xpert Xpress SARS-CoV-2/FLU/RSV plus assay is intended as an aid in the diagnosis of influenza from Nasopharyngeal swab specimens and should not be used as a sole basis for treatment. Nasal washings and aspirates are unacceptable for Xpert Xpress SARS-CoV-2/FLU/RSV testing.  Fact Sheet for Patients: BloggerCourse.com  Fact Sheet for Healthcare  Providers: SeriousBroker.it  This test is not yet approved or cleared by the [place] FDA and has been authorized for detection and/or diagnosis of SARS-CoV-2 by FDA under an Emergency Use Authorization (EUA). This EUA will remain in effect (meaning this test can be used) for the duration of the COVID-19 declaration under Section 564(b)(1) of the Act, 21 U.S.C. section 360bbb-3(b)(1), unless the authorization is terminated or revoked.     Resp Syncytial Virus by PCR NEGATIVE NEGATIVE Final    Comment: (NOTE) Fact Sheet for Patients: BloggerCourse.com  Fact Sheet for Healthcare Providers: SeriousBroker.it  This test is not yet approved or cleared by the [place] FDA and has been authorized for detection and/or diagnosis of SARS-CoV-2 by FDA under an Emergency Use Authorization (EUA). This EUA will remain in effect (meaning this test can be used)  for the duration of the COVID-19 declaration under Section 564(b)(1) of the Act, 21 U.S.C. section 360bbb-3(b)(1), unless the authorization is terminated or revoked.  Performed at [facility], [address] [place], [place] [address]   Blood Culture (routine x 2)     Status: None   Collection Time: 02/25/23 10:10 PM   Specimen: BLOOD  Result Value Ref Range Status   Specimen Description BLOOD LEFT ASSIST CONTROL  Final   Special Requests   Final    BOTTLES DRAWN AEROBIC AND ANAEROBIC Blood Culture adequate volume   Culture   Final    NO GROWTH 5 DAYS Performed at [facility], [address], [place], [place] [address]    Report Status 03/02/2023 FINAL  Final  Blood Culture (routine x 2)     Status: None   Collection Time: 02/25/23 10:10 PM   Specimen: BLOOD  Result Value Ref Range Status   Specimen Description BLOOD RIGHT FOREARM  Final   Special Requests   Final    BOTTLES DRAWN AEROBIC AND ANAEROBIC Blood Culture  adequate volume   Culture   Final    NO GROWTH 5 DAYS Performed at [facility], [address], [place], [place] [address]    Report Status 03/02/2023 FINAL  Final  MRSA Next Gen by PCR, Nasal     Status: None   Collection Time: 03/02/23  6:00 AM   Specimen: Nasal Mucosa; Nasal Swab  Result Value Ref Range Status   MRSA by PCR Next Gen NOT DETECTED NOT DETECTED Final    Comment: (NOTE) The GeneXpert MRSA Assay (FDA approved for NASAL specimens only), is one component of a comprehensive MRSA colonization surveillance program. It is not intended to diagnose MRSA infection nor to guide or monitor treatment for MRSA infections. Test performance is not FDA approved in patients less than 38 years old. Performed at [facility], [address] [place], [place] [address]          Radiology Studies: CT CHEST WO CONTRAST Result Date: 03/01/2023 CLINICAL DATA:  Dyspnea hypoxemia EXAM: CT CHEST WITHOUT CONTRAST TECHNIQUE: Multidetector CT imaging of the chest was performed following the standard protocol without IV contrast. RADIATION DOSE REDUCTION: This exam was performed according to the departmental dose-optimization program which includes automated exposure control, adjustment of the mA and/or kV according to patient size and/or use of iterative reconstruction technique. COMPARISON:  Chest x-ray 02/26/2023, CT 04/16/2009 FINDINGS: Cardiovascular: Advanced aortic atherosclerosis. No aneurysm. Post CABG changes. Coronary vascular calcification. Mild cardiomegaly. No pericardial effusion Mediastinum/Nodes: Patent trachea. No suspicious thyroid mass. Borderline right paratracheal node measuring 10 mm. Subcarinal lymph node slightly enlarged measuring 19 mm. Esophagus within normal limits. Lungs/Pleura: Moderate bilateral pleural effusions. Emphysema. Partial bilateral lower lobe consolidations. Septal thickening with mild ground-glass density in the upper lobes. Calcified  granuloma at the right middle lobe. 7 mm left upper lobe pulmonary nodule, series 4, image 58. Upper Abdomen: No acute finding. Gallstones. Rim calcified complex cyst upper pole right kidney, present since 2011 though with slight increase peripheral calcification, no imaging follow-up is recommended. Slight interval enlargement slightly dense right midpole lesion, measuring 3.6 cm, compared with 2.8 cm previously. Musculoskeletal: Post sternotomy changes. No acute or suspicious osseous abnormality. IMPRESSION: 1. Moderate bilateral pleural effusions with partial bilateral lower lobe consolidations, either representing atelectasis or pneumonia. Septal thickening with mild ground-glass density in the upper lobes suggestive of edema. 2. Emphysema. 7 mm left upper lobe pulmonary nodule. Non-contrast chest CT  at 6-12 months is recommended. If the nodule is stable at time of repeat CT, then future CT at 18-24 months (from today's scan) is considered optional for low-risk patients, but is recommended for high-risk patients. This recommendation follows the consensus statement: Guidelines for Management of Incidental Pulmonary Nodules Detected on CT Images: From the Fleischner Society 2017; Radiology 2017; 284:228-243. 3. Slight interval enlargement of slightly dense right midpole renal lesion, measuring 3.6 cm, compared with 2.8 cm previously. When the patient is clinically stable and able to follow directions and hold their breath (preferably as an outpatient) further evaluation with dedicated abdominal MRI should be considered. 4. Gallstones 5. Aortic atherosclerosis. Aortic Atherosclerosis (ICD10-I70.0) and Emphysema (ICD10-J43.9). Electronically Signed   By: Jasmine Pang M.D.   On: 03/01/2023 17:23         Scheduled Meds:  aspirin EC  81 mg Oral Daily   atorvastatin  5 mg Oral q1800   carvedilol  6.25 mg Oral BID WC   dexamethasone (DECADRON) injection  4 mg Intravenous Q24H   fluticasone  2 spray Each  Nare Daily   hydrALAZINE  50 mg Oral Q8H   insulin aspart  0-5 Units Subcutaneous QHS   insulin aspart  0-9 Units Subcutaneous TID WC   isosorbide mononitrate  60 mg Oral Daily   losartan  25 mg Oral Daily   spironolactone  12.5 mg Oral Daily   Continuous Infusions:  heparin 1,450 Units/hr (03/02/23 0300)     LOS: 5 days   Time spent 35 minutes  Delfino Lovett, MD [facility]   If 7PM-7AM, please contact night-coverage  03/02/2023, 10:32 AM

## 2023-03-02 NOTE — Progress Notes (Signed)
Heparin rate verified with 7 AM-7 PM Nurse Alberteen Spindle. Running at 14.5 ml/hr

## 2023-03-02 NOTE — Progress Notes (Addendum)
[facility] CLINIC CARDIOLOGY PROGRESS NOTE   Patient ID: Christopher Snow MRN: [id number] DOB/AGE: [birth year] 82 y.o.  Admit date: 02/25/2023 Referring Physician Dr. Georgeann Oppenheim Primary Physician Gracelyn Nurse, MD  Primary Cardiologist Dr. Darrold Junker Reason for Consultation NSTEMI, AoCHF  HPI: Christopher Snow is a 82 y.o. male with a past medical history of coronary artery disease s/p CABG x5 at [facility] >20 years ago, ICM, abdominal aortic aneurysm s/p repair at [facility], hypertension, type II diabetes who presented to the ED on 02/25/2023 for shortness of breath.   Interval History:  -Patient seen and examined this AM, reports he feels well overall. -Cr improved with holding diuresis. Denies any SOB or LE edema. Still requiring 10L [place]. -BP and HR appear stable. Denies any chest pain.   Review of systems complete and found to be negative unless listed above    Vitals:   03/02/23 0300 03/02/23 0339 03/02/23 0347 03/02/23 0816  BP:  136/72  (!) 156/67  Pulse:      Resp: (!) 21 (!) 21    Temp:  97.8 F (36.6 C)  97.8 F (36.6 C)  TempSrc:  Oral  Oral  SpO2:      Weight:   76.1 kg   Height:         Intake/Output Summary (Last 24 hours) at 03/02/2023 1214 Last data filed at 03/02/2023 1136 Gross per 24 hour  Intake 675.76 ml  Output 500 ml  Net 175.76 ml     PHYSICAL EXAM General: Chronically ill appearing elderly male, well nourished, in no acute distress sitting upright in hospital bed. HEENT: Normocephalic and atraumatic. Neck: No JVD.  Lungs: Normal respiratory effort on 10 L HFNC. Clear bilaterally to auscultation. No wheezes, crackles, rhonchi.  Heart: HRRR. Normal S1 and S2 without gallops or murmurs. Radial & DP pulses 2+ bilaterally. Abdomen: Non-distended appearing.  Msk: Normal strength and tone for age. Extremities: No clubbing, cyanosis or edema.   Neuro: Alert and oriented X 3. Psych: Mood appropriate, affect congruent.    LABS: Basic Metabolic  Panel: Recent Labs    03/01/23 0608 03/02/23 0456  NA 130* 127*  K 4.0 3.9  CL 95* 94*  CO2 23 21*  GLUCOSE 175* 253*  BUN 48* 53*  CREATININE 1.72* 1.63*  CALCIUM 8.5* 8.3*  MG 2.6* 2.5*  PHOS 4.1 4.4   Liver Function Tests: No results for input(s): "AST", "ALT", "ALKPHOS", "BILITOT", "PROT", "ALBUMIN" in the last 72 hours. No results for input(s): "LIPASE", "AMYLASE" in the last 72 hours. CBC: Recent Labs    03/01/23 0608 03/02/23 0456  WBC 9.0 9.8  HGB 10.9* 9.8*  HCT 34.4* 30.6*  MCV 72.9* 72.9*  PLT 260 257   Cardiac Enzymes: Recent Labs    02/27/23 1449 02/28/23 0807 02/28/23 0945  TROPONINIHS 5,326* 2,647* 2,483*   BNP: No results for input(s): "BNP" in the last 72 hours. D-Dimer: No results for input(s): "DDIMER" in the last 72 hours. Hemoglobin A1C: No results for input(s): "HGBA1C" in the last 72 hours. Fasting Lipid Panel: No results for input(s): "CHOL", "HDL", "LDLCALC", "TRIG", "CHOLHDL", "LDLDIRECT" in the last 72 hours. Thyroid Function Tests: No results for input(s): "TSH", "T4TOTAL", "T3FREE", "THYROIDAB" in the last 72 hours.  Invalid input(s): "FREET3" Anemia Panel: No results for input(s): "VITAMINB12", "FOLATE", "FERRITIN", "TIBC", "IRON", "RETICCTPCT" in the last 72 hours.  CT CHEST WO CONTRAST Result Date: 03/01/2023 CLINICAL DATA:  Dyspnea hypoxemia EXAM: CT CHEST WITHOUT CONTRAST TECHNIQUE: Multidetector CT imaging of the chest  was performed following the standard protocol without IV contrast. RADIATION DOSE REDUCTION: This exam was performed according to the departmental dose-optimization program which includes automated exposure control, adjustment of the mA and/or kV according to patient size and/or use of iterative reconstruction technique. COMPARISON:  Chest x-ray 02/26/2023, CT 04/16/2009 FINDINGS: Cardiovascular: Advanced aortic atherosclerosis. No aneurysm. Post CABG changes. Coronary vascular calcification. Mild cardiomegaly.  No pericardial effusion Mediastinum/Nodes: Patent trachea. No suspicious thyroid mass. Borderline right paratracheal node measuring 10 mm. Subcarinal lymph node slightly enlarged measuring 19 mm. Esophagus within normal limits. Lungs/Pleura: Moderate bilateral pleural effusions. Emphysema. Partial bilateral lower lobe consolidations. Septal thickening with mild ground-glass density in the upper lobes. Calcified granuloma at the right middle lobe. 7 mm left upper lobe pulmonary nodule, series 4, image 58. Upper Abdomen: No acute finding. Gallstones. Rim calcified complex cyst upper pole right kidney, present since 2011 though with slight increase peripheral calcification, no imaging follow-up is recommended. Slight interval enlargement slightly dense right midpole lesion, measuring 3.6 cm, compared with 2.8 cm previously. Musculoskeletal: Post sternotomy changes. No acute or suspicious osseous abnormality. IMPRESSION: 1. Moderate bilateral pleural effusions with partial bilateral lower lobe consolidations, either representing atelectasis or pneumonia. Septal thickening with mild ground-glass density in the upper lobes suggestive of edema. 2. Emphysema. 7 mm left upper lobe pulmonary nodule. Non-contrast chest CT at 6-12 months is recommended. If the nodule is stable at time of repeat CT, then future CT at 18-24 months (from today's scan) is considered optional for low-risk patients, but is recommended for high-risk patients. This recommendation follows the consensus statement: Guidelines for Management of Incidental Pulmonary Nodules Detected on CT Images: From the Fleischner Society 2017; Radiology 2017; 284:228-243. 3. Slight interval enlargement of slightly dense right midpole renal lesion, measuring 3.6 cm, compared with 2.8 cm previously. When the patient is clinically stable and able to follow directions and hold their breath (preferably as an outpatient) further evaluation with dedicated abdominal MRI should  be considered. 4. Gallstones 5. Aortic atherosclerosis. Aortic Atherosclerosis (ICD10-I70.0) and Emphysema (ICD10-J43.9). Electronically Signed   By: Jasmine Pang M.D.   On: 03/01/2023 17:23     ECHO 02/26/2023: 1. Left ventricular ejection fraction, by estimation, is 30 to 35%. The left ventricle has moderate to severely decreased function. The left ventricle demonstrates global hypokinesis. Left ventricular diastolic parameters are indeterminate.   2. Right ventricular systolic function is normal. The right ventricular size is mildly enlarged. There is mildly elevated pulmonary artery systolic pressure.   3. Left atrial size was mildly dilated.   4. The mitral valve is normal in structure. Mild mitral valve regurgitation.   5. Tricuspid valve regurgitation is mild to moderate.   6. The aortic valve has been repaired/replaced. Aortic valve  regurgitation is not visualized.   7. The inferior vena cava is dilated in size with <50% respiratory  variability, suggesting right atrial pressure of 15 mmHg.   TELEMETRY reviewed by me 03/02/23: sinus rhythm rate PVCs 70s  EKG reviewed by me 03/02/23: sinus rhythm rate 80 bpm, nonspecific ST-T changes  DATA reviewed by me 03/02/23: last 24h vitals tele labs imaging I/O, hospitalist progress note  Principal Problem:   Acute on chronic systolic CHF (congestive heart failure) (HCC) Active Problems:   CAD (coronary artery disease)   Hypertensive emergency   Acute respiratory failure with hypoxia (HCC)   Myocardial injury   HLD (hyperlipidemia)   Diabetes mellitus with peripheral circulatory disorder (HCC)   Hyponatremia   Leukocytosis   Dementia  without behavioral disturbance (HCC)    ASSESSMENT AND PLAN: Christopher Snow is a 82 y.o. male with a past medical history of coronary artery disease s/p CABG x5 at [facility] >20 years ago, ICM, abdominal aortic aneurysm s/p repair at [facility], hypertension, type II diabetes who presented to the ED on  02/25/2023 for shortness of breath.   # Acute hypoxic respiratory failure Etiology unclear, does not appear significantly volume up on exam. Initially required 4 L now on 10 L HFNC, required up to 50L HFNC overnight.  -Further workup per primary team. -Pulm consult.  # Acute on chronic HFrEF # Hypertensive emergency Patient presented with SOB, also noted to be hypertensive with systolics in the 200s. BNP elevated at 822. S/p IV diuresis. CT chest 03/01/23 with moderate bilateral pleural effusions.  -Will trial IV lasix 60 mg bid and assess response. -Continue hydralazine 50 mg tid, carvedilol 6.25 mg twice daily, losartan 25 mg daily, spironolactone 12.5 mg daily.   # NSTEMI # Coronary artery disease # Hyperlipidemia Patient with hx of CAD s/p CABG over 20 years ago. Denies any chest pain on evaluation this AM. Troponins peaked in the 5000s.  -Will defer LHC given he is asymptomatic and he is requiring a significant amount of O2. Plan for medical management.  -Continue heparin gtt.  -Continue imdur 60 mg daily.  -Continue atorvastatin 5 mg daily and aspirin 81 mg daily.   This patient's case was discussed and created with Dr. Corky Sing and he is in agreement.  Signed:  Gale Journey, PA-C  03/02/2023, 12:14 PM [facility]

## 2023-03-02 NOTE — Progress Notes (Signed)
Patient remained awake throughout the evening. Denies pain or discomfort. Encouraged patient to rest. Lights off, TV off. Personal items within reach

## 2023-03-02 NOTE — Consult Note (Signed)
PHARMACY CONSULT NOTE - ELECTROLYTES  Pharmacy Consult for Electrolyte Monitoring and Replacement   Recent Labs: Height: 6\' 3"  (190.5 cm) Weight: 76.1 kg (167 lb 12.3 oz) IBW/kg (Calculated) : 84.5 Estimated Creatinine Clearance: 36.3 mL/min (A) (by C-G formula based on SCr of 1.63 mg/dL (H)).  Potassium (mmol/L)  Date Value  03/02/2023 3.9  11/03/2011 4.6   Magnesium (mg/dL)  Date Value  84/13/2440 2.5 (H)   Calcium (mg/dL)  Date Value  01/10/2535 8.3 (L)   Calcium, Total (mg/dL)  Date Value  64/40/3474 8.9   Albumin (g/dL)  Date Value  25/95/6387 4.3   Phosphorus (mg/dL)  Date Value  56/43/3295 4.4   Sodium (mmol/L)  Date Value  03/02/2023 127 (L)  11/03/2011 137   Assessment  Christopher Snow is a 82 y.o. male presenting with chest pain. PMH significant for sCHF with EF 35-40%, HTN, HLD, DM, CAD, CBAG, PVD, and dementia. Possible NSTEMI, troponins peaked at 5398 and now down trending (plateauing). Possible demand ischemia in the setting of decompensated HF and HTN urgency. Pharmacy has been consulted to monitor and replace electrolytes.  Diet: PO MIVF: None Pertinent medications: spironolactone 12.5/d, losartan 25/d  Goal of Therapy: Electrolytes WNL K = 3.9 Mg = 2.6 Phos = 4.1  Plan:  No electrolyte replacement required at this time (patient no longer on lasix or torsemide) Check BMP, Mg, Phos with AM labs  Thank you for allowing pharmacy to be a part of this patient's care.  Effie Shy, PharmD Pharmacy Resident  03/02/2023 7:34 AM

## 2023-03-02 NOTE — Progress Notes (Signed)
PULMONOLOGY         Date: 03/02/2023,   MRN# [id number] Christopher Snow [birth year]     AdmissionWeight: 83.9 kg                 CurrentWeight: 76.1 kg  Referring provider: Dr. Clelia Croft   CHIEF COMPLAINT:   Acute hypoxemic respiratory failure   HISTORY OF PRESENT ILLNESS   This is a pleasant 82 year old male with a history of CHF, diabetes, previous MI, dyslipidemia, essential hypertension came in with hypoxemia.  He also does have a background history of dementia.  On arrival he was noted to be with SpO2 in the 80s and required 4 L of supplemental oxygen to reach saturation of over 92.  He also complained of chest discomfort and dry cough.  At home he has not previously been on oxygen at baseline.  He denied having abdominal or pelvic pain or dysuria.  In the ER he was noted to have accelerated hypertension.  Blood work on arrival showed elevated BNP negative viral workup lactic acid and procalcitonin within reference range renal function was normal troponin had mild elevation with maximum of 110.  He had chest x-ray performed with findings of cardiomegaly, interstitial and airspace opacification with bibasilar atelectasis.  Cardiology and pulmonology has been consulted to evaluate patient with acute on chronic hypoxemic respiratory failure.  He had CT ordered which was not performed quite yet.  He does have left heart cath scheduled however will be postponed till another day due to acute hypoxemia with increased O2 requirement.  03/02/23- patient weaning O2, reports less dyspnea.     PAST MEDICAL HISTORY   Past Medical History:  Diagnosis Date   CHF (congestive heart failure) (HCC)    Diabetes mellitus without complication (HCC)    Heart attack (HCC)    Hyperlipidemia    Hypertension      SURGICAL HISTORY   Past Surgical History:  Procedure Laterality Date   APPENDECTOMY     CARDIAC SURGERY     five bypass   CORONARY ARTERY BYPASS GRAFT     HERNIA REPAIR      PR VEIN BYPASS GRAFT,AORTO-FEM-POP       FAMILY HISTORY   Family History  Problem Relation Age of Onset   Heart disease Mother    Heart disease Father    Diabetes Maternal Grandmother    Diabetes Maternal Grandfather      SOCIAL HISTORY   Social History   Tobacco Use   Smoking status: Former   Smokeless tobacco: Never  Substance Use Topics   Alcohol use: No   Drug use: No     MEDICATIONS    Home Medication:    Current Medication:  Current Facility-Administered Medications:    acetaminophen (TYLENOL) tablet 650 mg, 650 mg, Oral, Q6H PRN, Allena Katz, Kishan S, RPH   albuterol (PROVENTIL) (2.5 MG/3ML) 0.083% nebulizer solution 2.5 mg, 2.5 mg, Nebulization, Q4H PRN, [name], RPH   aspirin EC tablet 81 mg, 81 mg, Oral, Daily, [name], MD, 81 mg at 03/01/23 0936   atorvastatin (LIPITOR) tablet 5 mg, 5 mg, Oral, q1800, [name], MD, 5 mg at 03/01/23 1733   carvedilol (COREG) tablet 6.25 mg, 6.25 mg, Oral, BID WC, Braun Rocca, MD, 6.25 mg at 03/01/23 1733   dextromethorphan-guaiFENesin (MUCINEX DM) 30-600 MG per 12 hr tablet 1 tablet, 1 tablet, Oral, BID PRN, [name], MD   fluticasone (FLONASE) 50 MCG/ACT nasal spray 2 spray, 2 spray,  Each Nare, Daily, [name], MD, 2 spray at 03/01/23 0941   heparin ADULT infusion 100 units/mL (25000 units/265mL), 1,450 Units/hr, Intravenous, Continuous, [name], [name], RPH, Last Rate: 14.5 mL/hr at 03/02/23 0300, 1,450 Units/hr at 03/02/23 0300   hydrALAZINE (APRESOLINE) injection 5 mg, 5 mg, Intravenous, Q2H PRN, [name], MD, 5 mg at 02/26/23 0102   hydrALAZINE (APRESOLINE) tablet 50 mg, 50 mg, Oral, Q8H, [name], MD, 50 mg at 03/02/23 0536   insulin aspart (novoLOG) injection 0-5 Units, 0-5 Units, Subcutaneous, QHS, [name], MD, 3 Units at 02/27/23 2108   insulin aspart (novoLOG) injection 0-9 Units, 0-9 Units, Subcutaneous, TID WC, [name], MD, 2 Units at 03/01/23 1734   isosorbide mononitrate  (IMDUR) 24 hr tablet 60 mg, 60 mg, Oral, Daily, [name], [name], MD, 60 mg at 03/01/23 0936   losartan (COZAAR) tablet 25 mg, 25 mg, Oral, Daily, [name], [name], MD, 25 mg at 03/01/23 0936   morphine (PF) 2 MG/ML injection 1 mg, 1 mg, Intravenous, Q4H PRN, [name], MD, 1 mg at 02/26/23 4782   spironolactone (ALDACTONE) tablet 12.5 mg, 12.5 mg, Oral, Daily, [name], [name], MD, 12.5 mg at 03/01/23 0940    ALLERGIES   Finasteride, Lisinopril, Penicillins, Statins, Tamsulosin, Terazosin, and Pseudoephedrine     REVIEW OF SYSTEMS    Review of Systems:  Gen:  Denies  fever, sweats, chills weigh loss  HEENT: Denies blurred vision, double vision, ear pain, eye pain, hearing loss, nose bleeds, sore throat Cardiac:  No dizziness, chest pain or heaviness, chest tightness,edema Resp:   reports dyspnea chronically  Gi: Denies swallowing difficulty, stomach pain, nausea or vomiting, diarrhea, constipation, bowel incontinence Gu:  Denies bladder incontinence, burning urine Ext:   Denies Joint pain, stiffness or swelling Skin: Denies  skin rash, easy bruising or bleeding or hives Endoc:  Denies polyuria, polydipsia , polyphagia or weight change Psych:   Denies depression, insomnia or hallucinations   Other:  All other systems negative   VS: BP 136/72 (BP Location: Right Arm)   Pulse 75   Temp 97.8 F (36.6 C) (Oral)   Resp (!) 21   Ht 6\' 3"  (1.905 m)   Wt 76.1 kg   SpO2 91%   BMI 20.97 kg/m      PHYSICAL EXAM    GENERAL:NAD, no fevers, chills, no weakness no fatigue HEAD: Normocephalic, atraumatic.  EYES: Pupils equal, round, reactive to light. Extraocular muscles intact. No scleral icterus.  MOUTH: Moist mucosal membrane. Dentition intact. No abscess noted.  EAR, NOSE, THROAT: Clear without exudates. No external lesions.  NECK: Supple. No thyromegaly. No nodules. No JVD.  PULMONARY: decreased breath sounds with mild rhonchi worse at bases bilaterally.   CARDIOVASCULAR: S1 and S2. Regular rate and rhythm. No murmurs, rubs, or gallops. No edema. Pedal pulses 2+ bilaterally.  GASTROINTESTINAL: Soft, nontender, nondistended. No masses. Positive bowel sounds. No hepatosplenomegaly.  MUSCULOSKELETAL: No swelling, clubbing, or edema. Range of motion full in all extremities.  NEUROLOGIC: Cranial nerves II through XII are intact. No gross focal neurological deficits. Sensation intact. Reflexes intact.  SKIN: No ulceration, lesions, rashes, or cyanosis. Skin warm and dry. Turgor intact.  PSYCHIATRIC: Mood, affect within normal limits. The patient is awake, alert and oriented x 3. Insight, judgment intact.       IMAGING   Study Result  Narrative & Impression  CLINICAL DATA:  Hypoxia.   EXAM: PORTABLE CHEST 1 VIEW   COMPARISON:  02/25/2023.   FINDINGS:  There is diffuse pulmonary vascular congestion, slightly more pronounced than the prior exam. Redemonstration of left retrocardiac airspace opacity obscuring the left hemidiaphragm, descending thoracic aorta and blunting the left lateral costophrenic angle suggesting combination of left lower lobe atelectasis and/or consolidation with pleural effusion. Right lateral costophrenic angle is clear.   Stable cardio-mediastinal silhouette.   There are surgical staples along the heart border and sternotomy wires, status post CABG (coronary artery bypass graft).   No acute osseous abnormalities.   The soft tissues are within normal limits.   IMPRESSION: *Findings favor worsening congestive heart failure/pulmonary edema. *Persistent left retrocardiac opacity, as described above.     Electronically Signed   By: Jules Schick M.D.    ASSESSMENT/PLAN   Acute hypoxemic respiratory failure -this seems to be LRTI induced acute on chronic CHF exacerbation.  Patient improved post diuresis.  Will add low dose decadron to help with inflammation and optimize respiratory status   - patient  denies having fevers of flu like illness   - he does have elevated BNP, but no LE edema.  Will diurese empirically.    - RVP for viral workup   - CRP and procalcitonin trend-      -Torsemide/aldactone combination   - currently on 12l/min O2 nasal canula            Thank you for allowing me to participate in the care of this patient.   Patient/Family are satisfied with care plan and all questions have been answered.    Provider disclosure: Patient with at least one acute or chronic illness or injury that poses a threat to life or bodily function and is being managed actively during this encounter.  All of the below services have been performed independently by signing provider:  review of prior documentation from internal and or external health records.  Review of previous and current lab results.  Interview and comprehensive assessment during patient visit today. Review of current and previous chest radiographs/CT scans. Discussion of management and test interpretation with health care team and patient/family.   This document was prepared using Dragon voice recognition software and may include unintentional dictation errors.     Vida Rigger, M.D.  Division of Pulmonary & Critical Care Medicine

## 2023-03-02 NOTE — Progress Notes (Signed)
Heparin rate verified  14.5 ml/hr with 7 AM-7 PM Nurse Alberteen Spindle.

## 2023-03-02 NOTE — Plan of Care (Signed)
  Problem: Fluid Volume: Goal: Ability to maintain a balanced intake and output will improve Outcome: Progressing   Problem: Metabolic: Goal: Ability to maintain appropriate glucose levels will improve Outcome: Progressing   Problem: Nutritional: Goal: Maintenance of adequate nutrition will improve Outcome: Progressing   Problem: Tissue Perfusion: Goal: Adequacy of tissue perfusion will improve Outcome: Progressing   Problem: Cardiac: Goal: Ability to achieve and maintain adequate cardiopulmonary perfusion will improve Outcome: Progressing

## 2023-03-03 DIAGNOSIS — J9601 Acute respiratory failure with hypoxia: Secondary | ICD-10-CM | POA: Diagnosis not present

## 2023-03-03 DIAGNOSIS — J189 Pneumonia, unspecified organism: Secondary | ICD-10-CM | POA: Diagnosis not present

## 2023-03-03 DIAGNOSIS — N2889 Other specified disorders of kidney and ureter: Secondary | ICD-10-CM

## 2023-03-03 DIAGNOSIS — I5023 Acute on chronic systolic (congestive) heart failure: Secondary | ICD-10-CM | POA: Diagnosis not present

## 2023-03-03 DIAGNOSIS — Z515 Encounter for palliative care: Secondary | ICD-10-CM

## 2023-03-03 DIAGNOSIS — F0390 Unspecified dementia without behavioral disturbance: Secondary | ICD-10-CM | POA: Diagnosis not present

## 2023-03-03 DIAGNOSIS — E871 Hypo-osmolality and hyponatremia: Secondary | ICD-10-CM | POA: Diagnosis not present

## 2023-03-03 DIAGNOSIS — I161 Hypertensive emergency: Secondary | ICD-10-CM | POA: Diagnosis not present

## 2023-03-03 DIAGNOSIS — I509 Heart failure, unspecified: Secondary | ICD-10-CM | POA: Diagnosis not present

## 2023-03-03 DIAGNOSIS — I25119 Atherosclerotic heart disease of native coronary artery with unspecified angina pectoris: Secondary | ICD-10-CM | POA: Diagnosis not present

## 2023-03-03 LAB — CBC
HCT: 30.2 % — ABNORMAL LOW (ref 39.0–52.0)
Hemoglobin: 9.6 g/dL — ABNORMAL LOW (ref 13.0–17.0)
MCH: 23.2 pg — ABNORMAL LOW (ref 26.0–34.0)
MCHC: 31.8 g/dL (ref 30.0–36.0)
MCV: 72.9 fL — ABNORMAL LOW (ref 80.0–100.0)
Platelets: 299 10*3/uL (ref 150–400)
RBC: 4.14 MIL/uL — ABNORMAL LOW (ref 4.22–5.81)
RDW: 18.8 % — ABNORMAL HIGH (ref 11.5–15.5)
WBC: 7.6 10*3/uL (ref 4.0–10.5)
nRBC: 0 % (ref 0.0–0.2)

## 2023-03-03 LAB — COMPREHENSIVE METABOLIC PANEL
ALT: 26 U/L (ref 0–44)
AST: 19 U/L (ref 15–41)
Albumin: 3.1 g/dL — ABNORMAL LOW (ref 3.5–5.0)
Alkaline Phosphatase: 83 U/L (ref 38–126)
Anion gap: 13 (ref 5–15)
BUN: 56 mg/dL — ABNORMAL HIGH (ref 8–23)
CO2: 20 mmol/L — ABNORMAL LOW (ref 22–32)
Calcium: 8.6 mg/dL — ABNORMAL LOW (ref 8.9–10.3)
Chloride: 95 mmol/L — ABNORMAL LOW (ref 98–111)
Creatinine, Ser: 1.4 mg/dL — ABNORMAL HIGH (ref 0.61–1.24)
GFR, Estimated: 50 mL/min — ABNORMAL LOW (ref >60)
Glucose, Bld: 225 mg/dL — ABNORMAL HIGH (ref 70–99)
Potassium: 4.4 mmol/L (ref 3.5–5.1)
Sodium: 128 mmol/L — ABNORMAL LOW (ref 135–145)
Total Bilirubin: 0.8 mg/dL (ref <1.2)
Total Protein: 6 g/dL — ABNORMAL LOW (ref 6.5–8.1)

## 2023-03-03 LAB — GLUCOSE, CAPILLARY
Glucose-Capillary: 219 mg/dL — ABNORMAL HIGH (ref 70–99)
Glucose-Capillary: 236 mg/dL — ABNORMAL HIGH (ref 70–99)
Glucose-Capillary: 381 mg/dL — ABNORMAL HIGH (ref 70–99)

## 2023-03-03 LAB — MAGNESIUM: Magnesium: 2.3 mg/dL (ref 1.7–2.4)

## 2023-03-03 LAB — HEPARIN LEVEL (UNFRACTIONATED): Heparin Unfractionated: 0.76 [IU]/mL — ABNORMAL HIGH (ref 0.30–0.70)

## 2023-03-03 LAB — PHOSPHORUS: Phosphorus: 4.6 mg/dL (ref 2.5–4.6)

## 2023-03-03 LAB — PROCALCITONIN: Procalcitonin: 0.13 ng/mL

## 2023-03-03 MED ORDER — EMPAGLIFLOZIN 10 MG PO TABS
10.0000 mg | ORAL_TABLET | Freq: Every day | ORAL | Status: DC
  Filled 2023-03-03: qty 1

## 2023-03-03 MED ORDER — GLYCOPYRROLATE 1 MG PO TABS: 1.0000 mg | ORAL_TABLET | ORAL | Status: DC | PRN

## 2023-03-03 MED ORDER — ACETAMINOPHEN 650 MG RE SUPP: 650.0000 mg | Freq: Four times a day (QID) | RECTAL | Status: DC | PRN

## 2023-03-03 MED ORDER — ONDANSETRON HCL 4 MG/2ML IJ SOLN: 4.0000 mg | Freq: Four times a day (QID) | INTRAMUSCULAR | Status: DC | PRN

## 2023-03-03 MED ORDER — GLYCOPYRROLATE 0.2 MG/ML IJ SOLN
0.2000 mg | INTRAMUSCULAR | Status: DC | PRN
Start: 2023-03-03 — End: 2023-03-05

## 2023-03-03 MED ORDER — MORPHINE SULFATE (PF) 2 MG/ML IV SOLN: 2.0000 mg | INTRAVENOUS | Status: DC | PRN

## 2023-03-03 MED ORDER — LORAZEPAM 2 MG/ML IJ SOLN
1.0000 mg | INTRAMUSCULAR | Status: DC | PRN
  Administered 2023-03-03: 1 mg via INTRAVENOUS
  Filled 2023-03-03: qty 1

## 2023-03-03 MED ORDER — MORPHINE SULFATE (PF) 2 MG/ML IV SOLN
1.0000 mg | INTRAVENOUS | Status: DC | PRN
  Administered 2023-03-03: 1 mg via INTRAVENOUS
  Filled 2023-03-03: qty 1

## 2023-03-03 MED ORDER — POLYVINYL ALCOHOL 1.4 % OP SOLN: 1.0000 [drp] | Freq: Four times a day (QID) | OPHTHALMIC | Status: DC | PRN

## 2023-03-03 MED ORDER — RISPERIDONE 0.5 MG PO TABS
0.5000 mg | ORAL_TABLET | Freq: Every day | ORAL | Status: DC
  Administered 2023-03-03 – 2023-03-04 (×2): 0.5 mg via ORAL
  Filled 2023-03-03 (×3): qty 1

## 2023-03-03 MED ORDER — HALOPERIDOL LACTATE 5 MG/ML IJ SOLN
2.5000 mg | INTRAMUSCULAR | Status: DC | PRN
Start: 2023-03-03 — End: 2023-03-05

## 2023-03-03 MED ORDER — DIPHENHYDRAMINE HCL 50 MG/ML IJ SOLN
25.0000 mg | INTRAMUSCULAR | Status: DC | PRN
Start: 2023-03-03 — End: 2023-03-05

## 2023-03-03 MED ORDER — ACETAMINOPHEN 325 MG PO TABS
650.0000 mg | ORAL_TABLET | Freq: Four times a day (QID) | ORAL | Status: DC | PRN
  Administered 2023-03-05: 650 mg via ORAL
  Filled 2023-03-03: qty 2

## 2023-03-03 MED ORDER — GLYCOPYRROLATE 0.2 MG/ML IJ SOLN: 0.2000 mg | INTRAMUSCULAR | Status: DC | PRN

## 2023-03-03 MED ORDER — ONDANSETRON 4 MG PO TBDP: 4.0000 mg | ORAL_TABLET | Freq: Four times a day (QID) | ORAL | Status: DC | PRN

## 2023-03-03 MED ORDER — LORAZEPAM 2 MG/ML IJ SOLN
2.0000 mg | INTRAMUSCULAR | Status: DC | PRN
  Administered 2023-03-03 – 2023-03-04 (×2): 2 mg via INTRAVENOUS
  Filled 2023-03-03 (×2): qty 1

## 2023-03-03 NOTE — Progress Notes (Signed)
   03/03/23 1400  Spiritual Encounters  Type of Visit Initial  Care provided to: Pt and family  Referral source Nurse (RN/NT/LPN)  Reason for visit End-of-life  OnCall Visit Yes  Spiritual Framework  Patient Stress Factors Major life changes  Family Stress Factors Major life changes  Interventions  Spiritual Care Interventions Made Established relationship of care and support;Compassionate presence;Reflective listening  Intervention Outcomes  Outcomes Awareness of support;Connection to spiritual care  Spiritual Care Plan  Spiritual Care Issues Still Outstanding No further spiritual care needs at this time (see row info)   Spend sometime talking to patient and family. Patient was very talkative during my visit and had a lot to say. Patient at time struggle to remember what year it was during out conversation. Patient family was happy that someone from the Spiritual Care department came to visit. Let patient and family know that we are here 24/7 if they needed someone to talk to during their time on the floor.

## 2023-03-03 NOTE — Plan of Care (Signed)
  Problem: Education: Goal: Ability to describe self-care measures that may prevent or decrease complications (Diabetes Survival Skills Education) will improve Outcome: Progressing Goal: Individualized Educational Video(s) Outcome: Progressing   Problem: Coping: Goal: Ability to adjust to condition or change in health will improve Outcome: Progressing   Problem: Fluid Volume: Goal: Ability to maintain a balanced intake and output will improve Outcome: Progressing   Problem: Health Behavior/Discharge Planning: Goal: Ability to identify and utilize available resources and services will improve Outcome: Progressing Goal: Ability to manage health-related needs will improve Outcome: Progressing   Problem: Metabolic: Goal: Ability to maintain appropriate glucose levels will improve Outcome: Progressing   Problem: Nutritional: Goal: Maintenance of adequate nutrition will improve Outcome: Progressing Goal: Progress toward achieving an optimal weight will improve Outcome: Progressing   Problem: Skin Integrity: Goal: Risk for impaired skin integrity will decrease Outcome: Progressing   Problem: Tissue Perfusion: Goal: Adequacy of tissue perfusion will improve Outcome: Progressing   Problem: Education: Goal: Knowledge of General Education information will improve Description: Including pain rating scale, medication(s)/side effects and non-pharmacologic comfort measures Outcome: Progressing   Problem: Health Behavior/Discharge Planning: Goal: Ability to manage health-related needs will improve Outcome: Progressing   Problem: Clinical Measurements: Goal: Ability to maintain clinical measurements within normal limits will improve Outcome: Progressing Goal: Will remain free from infection Outcome: Progressing Goal: Diagnostic test results will improve Outcome: Progressing Goal: Respiratory complications will improve Outcome: Progressing Goal: Cardiovascular complication will  be avoided Outcome: Progressing   Problem: Activity: Goal: Risk for activity intolerance will decrease Outcome: Progressing   Problem: Nutrition: Goal: Adequate nutrition will be maintained Outcome: Progressing   Problem: Coping: Goal: Level of anxiety will decrease Outcome: Progressing   Problem: Elimination: Goal: Will not experience complications related to bowel motility Outcome: Progressing Goal: Will not experience complications related to urinary retention Outcome: Progressing   Problem: Pain Management: Goal: General experience of comfort will improve Outcome: Progressing   Problem: Safety: Goal: Ability to remain free from injury will improve Outcome: Progressing   Problem: Skin Integrity: Goal: Risk for impaired skin integrity will decrease Outcome: Progressing   Problem: Education: Goal: Ability to demonstrate management of disease process will improve Outcome: Progressing Goal: Ability to verbalize understanding of medication therapies will improve Outcome: Progressing Goal: Individualized Educational Video(s) Outcome: Progressing   Problem: Activity: Goal: Capacity to carry out activities will improve Outcome: Progressing   Problem: Cardiac: Goal: Ability to achieve and maintain adequate cardiopulmonary perfusion will improve Outcome: Progressing   Problem: Education: Goal: Knowledge of the prescribed therapeutic regimen will improve Outcome: Progressing   Problem: Coping: Goal: Ability to identify and develop effective coping behavior will improve Outcome: Progressing   Problem: Clinical Measurements: Goal: Quality of life will improve Outcome: Progressing   Problem: Respiratory: Goal: Verbalizations of increased ease of respirations will increase Outcome: Progressing   Problem: Role Relationship: Goal: Family's ability to cope with current situation will improve Outcome: Progressing Goal: Ability to verbalize concerns, feelings, and  thoughts to partner or family member will improve Outcome: Progressing   Problem: Pain Management: Goal: Satisfaction with pain management regimen will improve Outcome: Progressing

## 2023-03-03 NOTE — Progress Notes (Signed)
PULMONOLOGY         Date: 03/03/2023,   MRN# [id number] Christopher Snow [birth year]     AdmissionWeight: 83.9 kg                 CurrentWeight: 78.1 kg  Referring provider: Dr. Clelia Croft   CHIEF COMPLAINT:   Acute hypoxemic respiratory failure   HISTORY OF PRESENT ILLNESS   This is a pleasant 82 year old male with a history of CHF, diabetes, previous MI, dyslipidemia, essential hypertension came in with hypoxemia.  He also does have a background history of dementia.  On arrival he was noted to be with SpO2 in the 80s and required 4 L of supplemental oxygen to reach saturation of over 92.  He also complained of chest discomfort and dry cough.  At home he has not previously been on oxygen at baseline.  He denied having abdominal or pelvic pain or dysuria.  In the ER he was noted to have accelerated hypertension.  Blood work on arrival showed elevated BNP negative viral workup lactic acid and procalcitonin within reference range renal function was normal troponin had mild elevation with maximum of 110.  He had chest x-ray performed with findings of cardiomegaly, interstitial and airspace opacification with bibasilar atelectasis.  Cardiology and pulmonology has been consulted to evaluate patient with acute on chronic hypoxemic respiratory failure.  He had CT ordered which was not performed quite yet.  He does have left heart cath scheduled however will be postponed till another day due to acute hypoxemia with increased O2 requirement.  03/02/23- patient weaning O2, reports less dyspnea.   03/03/23- patient is working with palliative care for comfort measures. PCCM will sign off and available if needed.    PAST MEDICAL HISTORY   Past Medical History:  Diagnosis Date   CHF (congestive heart failure) (HCC)    Diabetes mellitus without complication (HCC)    Heart attack (HCC)    Hyperlipidemia    Hypertension      SURGICAL HISTORY   Past Surgical History:  Procedure  Laterality Date   APPENDECTOMY     CARDIAC SURGERY     five bypass   CORONARY ARTERY BYPASS GRAFT     HERNIA REPAIR     PR VEIN BYPASS GRAFT,AORTO-FEM-POP       FAMILY HISTORY   Family History  Problem Relation Age of Onset   Heart disease Mother    Heart disease Father    Diabetes Maternal Grandmother    Diabetes Maternal Grandfather      SOCIAL HISTORY   Social History   Tobacco Use   Smoking status: Former   Smokeless tobacco: Never  Substance Use Topics   Alcohol use: No   Drug use: No     MEDICATIONS    Home Medication:    Current Medication:  Current Facility-Administered Medications:    acetaminophen (TYLENOL) tablet 650 mg, 650 mg, Oral, Q6H PRN, Allena Katz, Kishan S, RPH   albuterol (PROVENTIL) (2.5 MG/3ML) 0.083% nebulizer solution 2.5 mg, 2.5 mg, Nebulization, Q4H PRN, [name], RPH   aspirin EC tablet 81 mg, 81 mg, Oral, Daily, [name], MD, 81 mg at 03/02/23 0927   atorvastatin (LIPITOR) tablet 5 mg, 5 mg, Oral, q1800, [name], MD, 5 mg at 03/02/23 1721   carvedilol (COREG) tablet 6.25 mg, 6.25 mg, Oral, BID WC, Ameenah Prosser, MD, 6.25 mg at 03/02/23 1721   dexamethasone (DECADRON) injection 4 mg, 4 mg, Intravenous, Q24H, Vida Rigger, MD,  4 mg at 03/02/23 1132   dextromethorphan-guaiFENesin (MUCINEX DM) 30-600 MG per 12 hr tablet 1 tablet, 1 tablet, Oral, BID PRN, [name], MD   fluticasone (FLONASE) 50 MCG/ACT nasal spray 2 spray, 2 spray, Each Nare, Daily, Sreenath, Sudheer B, MD, 2 spray at 03/02/23 1133   furosemide (LASIX) injection 60 mg, 60 mg, Intravenous, BID, Hudson, Caralyn, PA-C, 60 mg at 03/02/23 1720   heparin ADULT infusion 100 units/mL (25000 units/257mL), 1,350 Units/hr, Intravenous, Continuous, Delfino Lovett, MD, Last Rate: 14.5 mL/hr at 03/02/23 1746, 1,450 Units/hr at 03/02/23 1746   hydrALAZINE (APRESOLINE) injection 5 mg, 5 mg, Intravenous, Q2H PRN, [name], MD, 5 mg at 02/26/23 0102   hydrALAZINE (APRESOLINE)  tablet 50 mg, 50 mg, Oral, Q8H, [name], MD, 50 mg at 03/03/23 0555   insulin aspart (novoLOG) injection 0-5 Units, 0-5 Units, Subcutaneous, QHS, [name], MD, 2 Units at 03/02/23 2141   insulin aspart (novoLOG) injection 0-9 Units, 0-9 Units, Subcutaneous, TID WC, [name], MD, 3 Units at 03/02/23 1721   isosorbide mononitrate (IMDUR) 24 hr tablet 60 mg, 60 mg, Oral, Daily, [name], [name], MD, 60 mg at 03/02/23 1914   losartan (COZAAR) tablet 25 mg, 25 mg, Oral, Daily, [name], [name], MD, 25 mg at 03/02/23 7829   morphine (PF) 2 MG/ML injection 1 mg, 1 mg, Intravenous, Q4H PRN, [name], MD, 1 mg at 02/26/23 5621   risperiDONE (RISPERDAL) tablet 0.5 mg, 0.5 mg, Oral, QHS, Delfino Lovett, MD   spironolactone (ALDACTONE) tablet 12.5 mg, 12.5 mg, Oral, Daily, [name], [name], MD, 12.5 mg at 03/02/23 3086    ALLERGIES   Finasteride, Lisinopril, Penicillins, Statins, Tamsulosin, Terazosin, and Pseudoephedrine     REVIEW OF SYSTEMS    Review of Systems:  Gen:  Denies  fever, sweats, chills weigh loss  HEENT: Denies blurred vision, double vision, ear pain, eye pain, hearing loss, nose bleeds, sore throat Cardiac:  No dizziness, chest pain or heaviness, chest tightness,edema Resp:   reports dyspnea chronically  Gi: Denies swallowing difficulty, stomach pain, nausea or vomiting, diarrhea, constipation, bowel incontinence Gu:  Denies bladder incontinence, burning urine Ext:   Denies Joint pain, stiffness or swelling Skin: Denies  skin rash, easy bruising or bleeding or hives Endoc:  Denies polyuria, polydipsia , polyphagia or weight change Psych:   Denies depression, insomnia or hallucinations   Other:  All other systems negative   VS: BP (!) 152/58 (BP Location: Right Arm)   Pulse 75   Temp 97.8 F (36.6 C) (Oral)   Resp (!) 21   Ht 6\' 3"  (1.905 m)   Wt 78.1 kg   SpO2 93%   BMI 21.52 kg/m      PHYSICAL EXAM    GENERAL:NAD, no fevers, chills, no weakness  no fatigue HEAD: Normocephalic, atraumatic.  EYES: Pupils equal, round, reactive to light. Extraocular muscles intact. No scleral icterus.  MOUTH: Moist mucosal membrane. Dentition intact. No abscess noted.  EAR, NOSE, THROAT: Clear without exudates. No external lesions.  NECK: Supple. No thyromegaly. No nodules. No JVD.  PULMONARY: decreased breath sounds with mild rhonchi worse at bases bilaterally.  CARDIOVASCULAR: S1 and S2. Regular rate and rhythm. No murmurs, rubs, or gallops. No edema. Pedal pulses 2+ bilaterally.  GASTROINTESTINAL: Soft, nontender, nondistended. No masses. Positive bowel sounds. No hepatosplenomegaly.  MUSCULOSKELETAL: No swelling, clubbing, or edema. Range of motion full in all extremities.  NEUROLOGIC: Cranial nerves II through XII are intact. No gross focal neurological deficits. Sensation  intact. Reflexes intact.  SKIN: No ulceration, lesions, rashes, or cyanosis. Skin warm and dry. Turgor intact.  PSYCHIATRIC: Mood, affect within normal limits. The patient is awake, alert and oriented x 3. Insight, judgment intact.       IMAGING   Study Result  Narrative & Impression  CLINICAL DATA:  Hypoxia.   EXAM: PORTABLE CHEST 1 VIEW   COMPARISON:  02/25/2023.   FINDINGS: There is diffuse pulmonary vascular congestion, slightly more pronounced than the prior exam. Redemonstration of left retrocardiac airspace opacity obscuring the left hemidiaphragm, descending thoracic aorta and blunting the left lateral costophrenic angle suggesting combination of left lower lobe atelectasis and/or consolidation with pleural effusion. Right lateral costophrenic angle is clear.   Stable cardio-mediastinal silhouette.   There are surgical staples along the heart border and sternotomy wires, status post CABG (coronary artery bypass graft).   No acute osseous abnormalities.   The soft tissues are within normal limits.   IMPRESSION: *Findings favor worsening congestive  heart failure/pulmonary edema. *Persistent left retrocardiac opacity, as described above.     Electronically Signed   By: Jules Schick M.D.    ASSESSMENT/PLAN   Acute hypoxemic respiratory failure -this seems to be LRTI induced acute on chronic CHF exacerbation.  Patient improved post diuresis.  Will add low dose decadron to help with inflammation and optimize respiratory status   - patient denies having fevers of flu like illness   - he does have elevated BNP, but no LE edema.  Will diurese empirically.    - RVP for viral workup   - CRP and procalcitonin trend-      -Torsemide/aldactone combination   - currently on 12l/min O2 nasal canula            Thank you for allowing me to participate in the care of this patient.   Patient/Family are satisfied with care plan and all questions have been answered.    Provider disclosure: Patient with at least one acute or chronic illness or injury that poses a threat to life or bodily function and is being managed actively during this encounter.  All of the below services have been performed independently by signing provider:  review of prior documentation from internal and or external health records.  Review of previous and current lab results.  Interview and comprehensive assessment during patient visit today. Review of current and previous chest radiographs/CT scans. Discussion of management and test interpretation with health care team and patient/family.   This document was prepared using Dragon voice recognition software and may include unintentional dictation errors.     Vida Rigger, M.D.  Division of Pulmonary & Critical Care Medicine

## 2023-03-03 NOTE — TOC Progression Note (Signed)
Transition of Care [facility]) - Progression Note    Patient Details  Name: Christopher Snow MRN: [id number] Date of Birth: 07-07-1940  Transition of Care [facility]) CM/SW Contact  Margarito Liner, LCSW Phone Number: 03/03/2023, 10:34 AM  Clinical Narrative:   Per MD, daughter is considering comfort care/hospice. No family at bedside. CSW called daughter. She is going home shortly to discuss with her mother. She is agreeable to having the hospice home to evaluate to see if he qualifies. We discussed home with hospice vs SNF with hospice if he does not qualify. Daughter does not think wife would be able to manage at home and stated they would not be able to pay privately for room and board at a SNF.  Expected Discharge Plan: Home/Self Care Barriers to Discharge: Continued Medical Work up  Expected Discharge Plan and Services     Post Acute Care Choice: NA Living arrangements for the past 2 months: Single Family Home                                       Social Determinants of Health (SDOH) Interventions SDOH Screenings   Food Insecurity: No Food Insecurity (02/26/2023)  Housing: Low Risk  (02/26/2023)  Transportation Needs: No Transportation Needs (02/26/2023)  Utilities: Not At Risk (02/26/2023)  Financial Resource Strain: Low Risk  (04/02/2020)   Received from [facility]  Physical Activity: Insufficiently Active (11/07/2018)  Social Connections: Moderately Integrated (11/07/2018)  Stress: No Stress Concern Present (11/07/2018)  Tobacco Use: Medium Risk (03/02/2023)    Readmission Risk Interventions    03/01/2023    4:11 PM  Readmission Risk Prevention Plan  Transportation Screening Complete  PCP or Specialist Appt within 3-5 Days Complete  Social Work Consult for Recovery Care Planning/Counseling Complete  Palliative Care Screening Complete  Medication Review [employer]) Complete

## 2023-03-03 NOTE — Plan of Care (Signed)
  Problem: Fluid Volume: Goal: Ability to maintain a balanced intake and output will improve Outcome: Progressing   Problem: Nutritional: Goal: Maintenance of adequate nutrition will improve Outcome: Progressing   Problem: Education: Goal: Ability to verbalize understanding of medication therapies will improve Outcome: Progressing   Problem: Cardiac: Goal: Ability to achieve and maintain adequate cardiopulmonary perfusion will improve Outcome: Progressing

## 2023-03-03 NOTE — Progress Notes (Signed)
Patient has been up all evening talking out load. Unable to redirect this evening, Increased anxiety this am. Continues to remove his 02. Continue to encourage patient to try and relax and rest. Personal items within reach. Safety maintained.

## 2023-03-03 NOTE — Progress Notes (Signed)
Palliative Care Progress Note, Assessment & Plan   Patient Name: Christopher Snow       Date: 03/03/2023 DOB: 04-25-1940  Age: 82 y.o. MRN#: [id number] Attending Physician: Delfino Lovett, MD Primary Care Physician: Gracelyn Nurse, MD Admit Date: 02/25/2023  Subjective: Patient is sitting up in bed in no apparent distress.  He is alert, able to recognize my presence, and appears able to make his wishes known.  However, he is speaking in a stream of consciousness without stopping.   HPI: 82 y.o. male  with past medical history of sCHF with EF 30-35% on most recent echo, HTN, HLD, DM, CAD s/p CABG, PVD and dementia admitted from home on 02/25/2023 with chest pain.   Found to be hypoxic in ED, placed on 4L [place] which has now progressed to [facility] which he remains on. BNP 822, CXR revealing vascular congestion-admitted and being treated for acute on chronic CHF with acute hypoxia, possible NSTEMI and hypertensive emergency.  After discussion with attending this morning, family in agreement to shift to full comfort measures.   Palliative medicine was consulted for assisting with goals of care conversations  Summary of counseling/coordination of care: Extensive chart review completed prior to meeting patient including labs, vital signs, imaging, progress notes, orders, and available advanced directive documents from current and previous encounters.   After reviewing the patient's chart and assessing the patient at bedside, I discussed symptom management with patient and RN.  Patient has been converted to full comfort measures.  RN history given Ativan and morphine as needed about 1 hour ago.  She endorses that it did not seem to help calm the patient.  He is in no distress at the moment but is fidgeting and having  constant conversation with himself.  In review of MAR, I agree with Risperdal dose to be given this evening to aid with sleep.  Should this not help patient rest, use of Seroquel should be considered.  After assessing the patient and speaking with his RN, I attempted to speak with the patient's daughter over the phone.  No answer.  HIPAA compliant voicemail with PMT contact info left.  Full comfort measures in place.  PMT will continue to follow and support patient throughout hospitalization.  Physical Exam Vitals reviewed.  Constitutional:      General: He is not in acute distress.    Appearance: He is normal weight.  Eyes:     Pupils: Pupils are equal, round, and reactive to light.  Cardiovascular:     Rate and Rhythm: Normal rate.  Pulmonary:     Breath sounds: Examination of the right-lower field reveals decreased breath sounds. Examination of the left-lower field reveals decreased breath sounds. Decreased breath sounds present.  Skin:    Coloration: Skin is pale.  Neurological:     Mental Status: He is alert.     Comments: Alert to self  Psychiatric:        Mood and Affect: Mood is not anxious.        Behavior: Behavior is not agitated.             Total Time 35 minutes   Time spent includes: Detailed review of medical  records (labs, imaging, vital signs), medically appropriate exam (mental status, respiratory, cardiac, skin), discussed with treatment team, counseling and educating patient, family and staff, documenting clinical information, medication management and coordination of care.  Samara Deist L. Bonita Quin, DNP, FNP-BC Palliative Medicine Team

## 2023-03-03 NOTE — Consult Note (Signed)
PHARMACY CONSULT NOTE - ELECTROLYTES  Pharmacy Consult for Electrolyte Monitoring and Replacement   Recent Labs: Height: 6\' 3"  (190.5 cm) Weight: 78.1 kg (172 lb 2.9 oz) IBW/kg (Calculated) : 84.5 Estimated Creatinine Clearance: 43.4 mL/min (A) (by C-G formula based on SCr of 1.4 mg/dL (H)).  Potassium (mmol/L)  Date Value  03/03/2023 4.4  11/03/2011 4.6   Magnesium (mg/dL)  Date Value  16/12/9602 2.3   Calcium (mg/dL)  Date Value  54/11/8117 8.6 (L)   Calcium, Total (mg/dL)  Date Value  14/78/2956 8.9   Albumin (g/dL)  Date Value  21/30/8657 3.1 (L)   Phosphorus (mg/dL)  Date Value  84/69/6295 4.6   Sodium (mmol/L)  Date Value  03/03/2023 128 (L)  11/03/2011 137   Assessment  Christopher Snow Odor is a 82 y.o. male presenting with chest pain. PMH significant for sCHF with EF 35-40%, HTN, HLD, DM, CAD, CBAG, PVD, and dementia. Possible NSTEMI, troponins peaked at 5398 and now down trending (plateauing). Possible demand ischemia in the setting of decompensated HF and HTN urgency. Pharmacy has been consulted to monitor and replace electrolytes.  Diet: PO MIVF: None Pertinent medications: spironolactone 12.5/d, losartan 25/d, lasix 60 mg IV BID.   Goal of Therapy: Electrolytes WNL  Plan:  No replacement needed today.  F/u with AM labs.   Thank you for allowing pharmacy to be a part of this patient's care.  Ronnald Ramp, PharmD 03/03/2023 7:57 AM

## 2023-03-03 NOTE — Consult Note (Signed)
Urology Consult  Requesting physician: Delfino Lovett, MD  Reason for consultation: Renal mass  Chief Complaint: N/A  History of Present Illness: Christopher Snow is a 82 y.o. y.o. male admitted 02/25/2023 with chest pain and shortness of breath.  He has dementia and was unable to obtain a history from the patient.  His daughter was present who provided history as well as chart review.  A chest CT was performed as part of his initial evaluation and he was incidentally noted on lower chest LUTS to have a right renal mass. Subsequent renal mass protocol CT of the abdomen pelvis was performed which showed a 3.3 x 3.5 solid-appearing partially exophytic right renal mass which was present on a prior CT performed in 2009 and measured 2.6 x 2.4 cm Also noted to have bilateral Bosniak 2 renal cysts No voiding problems or gross hematuria  Past Medical History:  Diagnosis Date   CHF (congestive heart failure) (HCC)    Diabetes mellitus without complication (HCC)    Heart attack (HCC)    Hyperlipidemia    Hypertension     Past Surgical History:  Procedure Laterality Date   APPENDECTOMY     CARDIAC SURGERY     five bypass   CORONARY ARTERY BYPASS GRAFT     HERNIA REPAIR     PR VEIN BYPASS GRAFT,AORTO-FEM-POP      Home Medications:  Current Meds  Medication Sig   albuterol (VENTOLIN HFA) 108 (90 Base) MCG/ACT inhaler 2 puffs q.i.d. p.r.n. short of breath, wheezing, or cough   amLODipine (NORVASC) 10 MG tablet Take 10 mg by mouth daily.   aspirin EC 81 MG tablet Take 81 mg by mouth daily.   atorvastatin (LIPITOR) 10 MG tablet Take 10 mg by mouth daily at 6 PM.   furosemide (LASIX) 40 MG tablet Take 1 tablet (40 mg total) by mouth daily.   glipiZIDE (GLUCOTROL) 5 MG tablet Take 2 tablets by mouth daily.   hydrALAZINE (APRESOLINE) 25 MG tablet Take 25 mg by mouth 3 (three) times daily.   isosorbide mononitrate (IMDUR) 30 MG 24 hr tablet TAKE ONE TABLET BY MOUTH ONCE DAILY   JARDIANCE 10  MG TABS tablet Take 1 tablet by mouth daily with breakfast.   memantine (NAMENDA) 5 MG tablet Take 1 tablet by mouth 2 (two) times daily.   metFORMIN (GLUCOPHAGE) 1000 MG tablet Take 1,000 mg by mouth 2 (two) times daily with a meal.   sertraline (ZOLOFT) 50 MG tablet Take 1 tablet by mouth daily.    Allergies:  Allergies  Allergen Reactions   Finasteride     Other Reaction(s): Weakness present   Lisinopril     Other Reaction(s): Chest pain   Penicillins    Statins Other (See Comments)   Tamsulosin Other (See Comments)   Terazosin     Other Reaction(s): Orthostatic hypotension   Pseudoephedrine Palpitations    Family History  Problem Relation Age of Onset   Heart disease Mother    Heart disease Father    Diabetes Maternal Grandmother    Diabetes Maternal Grandfather     Social History:  reports that he has quit smoking. He has never used smokeless tobacco. He reports that he does not drink alcohol and does not use drugs.  ROS: A complete review of systems was performed.  All systems are negative except for pertinent findings as noted.  Physical Exam:  Vital signs in last 24 hours: Temp:  [97.8 F (36.6 C)-98.2 F (36.8 C)]  98 F (36.7 C) (12/18 1932) Pulse Rate:  [89-94] 89 (12/18 1932) Resp:  [17-22] 22 (12/18 1932) BP: (140-152)/(58-84) 140/73 (12/18 1932) SpO2:  [95 %-97 %] 96 % (12/18 1932) Weight:  [78.1 kg] 78.1 kg (12/18 0349) Constitutional:  Alert , No acute distress HEENT: [place] AT Respiratory: Normal respiratory effort   Laboratory Data:  Recent Labs    03/01/23 0608 03/02/23 0456 03/03/23 0707  WBC 9.0 9.8 7.6  HGB 10.9* 9.8* 9.6*  HCT 34.4* 30.6* 30.2*   Recent Labs    03/01/23 0608 03/02/23 0456 03/03/23 0449  NA 130* 127* 128*  K 4.0 3.9 4.4  CL 95* 94* 95*  CO2 23 21* 20*  GLUCOSE 175* 253* 225*  BUN 48* 53* 56*  CREATININE 1.72* 1.63* 1.40*  CALCIUM 8.5* 8.3* 8.6*   No results for input(s): "LABPT", "INR" in the last 72  hours. No results for input(s): "LABURIN" in the last 72 hours. Results for orders placed or performed during the hospital encounter of 02/25/23  Resp panel by RT-PCR (RSV, Flu A&B, Covid) Anterior Nasal Swab     Status: None   Collection Time: 02/25/23 10:09 PM   Specimen: Anterior Nasal Swab  Result Value Ref Range Status   SARS Coronavirus 2 by RT PCR NEGATIVE NEGATIVE Final    Comment: (NOTE) SARS-CoV-2 target nucleic acids are NOT DETECTED.  The SARS-CoV-2 RNA is generally detectable in upper respiratory specimens during the acute phase of infection. The lowest concentration of SARS-CoV-2 viral copies this assay can detect is 138 copies/mL. A negative result does not preclude SARS-Cov-2 infection and should not be used as the sole basis for treatment or other patient management decisions. A negative result may occur with  improper specimen collection/handling, submission of specimen other than nasopharyngeal swab, presence of viral mutation(s) within the areas targeted by this assay, and inadequate number of viral copies(<138 copies/mL). A negative result must be combined with clinical observations, patient history, and epidemiological information. The expected result is Negative.  Fact Sheet for Patients:  BloggerCourse.com  Fact Sheet for Healthcare Providers:  SeriousBroker.it  This test is no t yet approved or cleared by the [place] FDA and  has been authorized for detection and/or diagnosis of SARS-CoV-2 by FDA under an Emergency Use Authorization (EUA). This EUA will remain  in effect (meaning this test can be used) for the duration of the COVID-19 declaration under Section 564(b)(1) of the Act, 21 U.S.C.section 360bbb-3(b)(1), unless the authorization is terminated  or revoked sooner.       Influenza A by PCR NEGATIVE NEGATIVE Final   Influenza B by PCR NEGATIVE NEGATIVE Final    Comment: (NOTE) The Xpert  Xpress SARS-CoV-2/FLU/RSV plus assay is intended as an aid in the diagnosis of influenza from Nasopharyngeal swab specimens and should not be used as a sole basis for treatment. Nasal washings and aspirates are unacceptable for Xpert Xpress SARS-CoV-2/FLU/RSV testing.  Fact Sheet for Patients: BloggerCourse.com  Fact Sheet for Healthcare Providers: SeriousBroker.it  This test is not yet approved or cleared by the [place] FDA and has been authorized for detection and/or diagnosis of SARS-CoV-2 by FDA under an Emergency Use Authorization (EUA). This EUA will remain in effect (meaning this test can be used) for the duration of the COVID-19 declaration under Section 564(b)(1) of the Act, 21 U.S.C. section 360bbb-3(b)(1), unless the authorization is terminated or revoked.     Resp Syncytial Virus by PCR NEGATIVE NEGATIVE Final    Comment: (NOTE) Fact  Sheet for Patients: BloggerCourse.com  Fact Sheet for Healthcare Providers: SeriousBroker.it  This test is not yet approved or cleared by the [place] FDA and has been authorized for detection and/or diagnosis of SARS-CoV-2 by FDA under an Emergency Use Authorization (EUA). This EUA will remain in effect (meaning this test can be used) for the duration of the COVID-19 declaration under Section 564(b)(1) of the Act, 21 U.S.C. section 360bbb-3(b)(1), unless the authorization is terminated or revoked.  Performed at [facility], [address] [place], [place] [address]   Blood Culture (routine x 2)     Status: None   Collection Time: 02/25/23 10:10 PM   Specimen: BLOOD  Result Value Ref Range Status   Specimen Description BLOOD LEFT ASSIST CONTROL  Final   Special Requests   Final    BOTTLES DRAWN AEROBIC AND ANAEROBIC Blood Culture adequate volume   Culture   Final    NO GROWTH 5 DAYS Performed at [facility], [address], [place], [place] [address]    Report Status 03/02/2023 FINAL  Final  Blood Culture (routine x 2)     Status: None   Collection Time: 02/25/23 10:10 PM   Specimen: BLOOD  Result Value Ref Range Status   Specimen Description BLOOD RIGHT FOREARM  Final   Special Requests   Final    BOTTLES DRAWN AEROBIC AND ANAEROBIC Blood Culture adequate volume   Culture   Final    NO GROWTH 5 DAYS Performed at [facility], [address] [place], [place] [address]    Report Status 03/02/2023 FINAL  Final  MRSA Next Gen by PCR, Nasal     Status: None   Collection Time: 03/02/23  6:00 AM   Specimen: Nasal Mucosa; Nasal Swab  Result Value Ref Range Status   MRSA by PCR Next Gen NOT DETECTED NOT DETECTED Final    Comment: (NOTE) The GeneXpert MRSA Assay (FDA approved for NASAL specimens only), is one component of a comprehensive MRSA colonization surveillance program. It is not intended to diagnose MRSA infection nor to guide or monitor treatment for MRSA infections. Test performance is not FDA approved in patients less than 32 years old. Performed at [facility], [address] [place], [place] [address]   Respiratory (~20 pathogens) panel by PCR     Status: None   Collection Time: 03/02/23  6:00 AM   Specimen: Nasopharyngeal Swab; Respiratory  Result Value Ref Range Status   Adenovirus NOT DETECTED NOT DETECTED Final   Coronavirus 229E NOT DETECTED NOT DETECTED Final    Comment: (NOTE) The Coronavirus on the Respiratory Panel, DOES NOT test for the novel  Coronavirus (2019 nCoV)    Coronavirus HKU1 NOT DETECTED NOT DETECTED Final   Coronavirus NL63 NOT DETECTED NOT DETECTED Final   Coronavirus OC43 NOT DETECTED NOT DETECTED Final   Metapneumovirus NOT DETECTED NOT DETECTED Final   Rhinovirus / Enterovirus NOT DETECTED NOT DETECTED Final   Influenza A NOT DETECTED NOT DETECTED Final   Influenza B NOT DETECTED NOT DETECTED Final    Parainfluenza Virus 1 NOT DETECTED NOT DETECTED Final   Parainfluenza Virus 2 NOT DETECTED NOT DETECTED Final   Parainfluenza Virus 3 NOT DETECTED NOT DETECTED Final   Parainfluenza Virus 4 NOT DETECTED NOT DETECTED Final   Respiratory Syncytial Virus NOT DETECTED NOT DETECTED Final   Bordetella pertussis NOT DETECTED NOT DETECTED Final   Bordetella Parapertussis NOT DETECTED NOT DETECTED Final   Chlamydophila pneumoniae NOT  DETECTED NOT DETECTED Final   Mycoplasma pneumoniae NOT DETECTED NOT DETECTED Final    Comment: Performed at [facility], [address] [place], [place] [address]     Radiologic Imaging: CT abdomen pelvis was personally reviewed and interpreted  CT ABDOMEN W WO CONTRAST Result Date: 03/02/2023 CLINICAL DATA:  Renal mass. EXAM: CT ABDOMEN WITHOUT AND WITH CONTRAST TECHNIQUE: Multidetector CT imaging of the abdomen was performed following the standard protocol before and following the bolus administration of intravenous contrast. RADIATION DOSE REDUCTION: This exam was performed according to the departmental dose-optimization program which includes automated exposure control, adjustment of the mA and/or kV according to patient size and/or use of iterative reconstruction technique. CONTRAST:  80mL OMNIPAQUE IOHEXOL 300 MG/ML  SOLN COMPARISON:  Chest CT without contrast 03/01/2023. Old CT scan abdomen pelvis 2011 February FINDINGS: Lower chest: Moderate pleural effusions are again seen as on the prior chest CT scan with the adjacent lung opacity. Please correlate with the chest CT. Calcified nodule in the right lung. Enlarged heart. Hepatobiliary: Gallstone seen in the nondilated gallbladder. Preserved hepatic parenchyma without obvious mass. Patent portal vein. Pancreas: Atrophy of the pancreas.  No obvious pancreatic mass Spleen: Normal in size without focal abnormality. Adrenals/Urinary Tract: The adrenal glands are preserved. Mild left-sided renal atrophy. There is  some heterogeneous enhancement of the upper pole left kidney, nonspecific. Few tiny low-attenuation lesions identified as well which are too small to completely characterize but likely benign cystic focus. There is 1 lesion however along the upper pole left kidney which may have a thickened wall measuring overall 13 x 11 mm on coronal images and axial series 17 image 14 AP dimension of 10 mm. Wall is somewhat more thick than expected. Wall thickness approaches 2-3 mm. Bosniak 2 Fahrenheit lesion but suspicious. No left-sided renal collecting system dilatation. The right kidney also has some Bosniak 2 small cystic foci. More preserved renal parenchyma. Along the upper pole exophytic laterally is a rim calcified cystic focus measuring 3.6 by 3.1 cm today. Going back to 2011 this lesion when measured in the same fashion as today would measure 3.7 3.2 cm, not significantly increased in size. There are more dystrophic calcifications today. However immediately caudal to this laterally and partially exophytic is a more solid-appearing lesion today measuring 3.3 by 3.5 cm. This lesion on precontrast has Hounsfield unit of 26, arterial phase of 22, nephrographic phase of 38 and on delay 48. With this type of enhancement this could be a slow growing renal cell carcinoma, papillary subtype. This has much smaller in 2011 measuring 2.7 cm. Stomach/Bowel: On this non oral contrast exam, the visualized bowel is nondilated. Significant colonic stool identified. Stomach is mildly distended with fluid and air. Wall thickening seen along the lower esophagus. Vascular/Lymphatic: Significant atherosclerotic calcified plaque identified along the native aorta with the areas extending into the branch vessels. Significant stenosis suggested of several vessels including the renal arteries bilaterally. The SMA, hepatic artery origin. There is evidence of the aorta bypass not included in the imaging field in its entirety. Please correlate with  surgical history. No specific abnormal lymph node enlargement identified in the visualized portions of the abdomen. Other: No free air or free fluid identified. Protuberance along the anterior abdominal wall with small fat containing hernia seen just above the umbilicus. Musculoskeletal: Curvature of the spine with some degenerative changes. IMPRESSION: Enlarging enhancing mass in the right kidney laterally measuring 3.3 x 3.5 cm. This has worrisome for developing renal cell  carcinoma. With the enhancement pattern greatest on the most delayed this could be a papillary subtype. Stable in size complex lesion more superior to this laterally in the right kidney with increasing dystrophic calcification. Benign lesion. There is mildly complex lesion along the upper pole left kidney which has a slightly thickened wall, recommend follow up in 6 months. Bosniak 2 F Significant calcified atherosclerotic plaque along the aorta and branch vessels with multiple areas of potential significant stenosis including the SMA, bilateral renal arteries. Please correlate with surgical and clinical history. Gallstones. Bilateral pleural effusions and opacities seen. Please correlate with separate chest CT scan. Significant colonic stool. Electronically Signed   By: Karen Kays M.D.   On: 03/02/2023 16:28    Impression/Recommendation:   1.  Right renal mass I discussed with his daughter this is most likely a slow-growing renal cell carcinoma.  It was initially identified in 2009 and measured 2.4 x 2.6 cm and 15 years later with minimal growth at 3.3 x 3.5 cm We discussed surveillance is an acceptable option and renal masses <4 cm in the chances of metastasis without growth are <5% Surgical removal risk would outweigh benefits We did discuss the possibility of percutaneous cryoablation by IR She has elected surveillance and would recommend follow-up CT in 6 months   03/03/2023, 9:57 PM  Irineo Axon,  MD

## 2023-03-03 NOTE — Progress Notes (Signed)
PROGRESS NOTE    Christopher Snow  HBZ:[id number] DOB: Nov 27, 1940 DOA: 02/25/2023 PCP: Gracelyn Nurse, MD    Brief Narrative:  82 y.o. male with medical history significant of sCHF with EF 35-405,  HTN, HLD, DM, CAD, CBAG, PVD, dementia, who presents with chest pain.   Per his daughter at bedside, pt started having chest pain since afternoon.  The chest pain is located in left side of chest, radiating to the left arm.  Patient cannot characterize his pain in detail due to dementia.  Patient has mild dry cough.  Patient does not have significant shortness breath, but was found to have oxygen desaturation to 80s on room air in ED, which improved to 92-96% on 4 L of oxygen.  Patient is normally not using oxygen at home.  Patient blood pressure is significantly elevated with SBP 200s, nitroglycerin drip is started in ED.   12/15: Palliative care consult.  Repleting electrolytes, weaning off nitroglycerin drip 12/16: CT chest without contrast, pulmonary and nephro consult 12/17: CT abdomen and pelvis for further evaluation renal mass.  Renal ultrasound pending, Decadron started 12/18: terminal agitation/delirium? GOC - Comfort care initiated   Assessment & Plan:   Principal Problem:   Acute on chronic systolic CHF (congestive heart failure) (HCC) Active Problems:   Acute respiratory failure with hypoxia (HCC)   Hypertensive emergency   CAD (coronary artery disease)   Myocardial injury   HLD (hyperlipidemia)   Diabetes mellitus with peripheral circulatory disorder (HCC)   Hyponatremia   Leukocytosis   Dementia without behavioral disturbance (HCC)   Hospice care  Acute respiratory failure with hypoxia due to acute on chronic systolic CHF (congestive heart failure) (HCC):  Hypertensive emergency:  CAD (coronary artery disease) Elevated troponins HLD (hyperlipidemia) Diabetes mellitus with peripheral circulatory disorder [facility]):  Hyponatremia:  Leukocytosis:  Dementia without  behavioral disturbance (HCC) AKI Agitation - could be terminal vs acute delirum  Family seeking full comfort care.   DVT prophylaxis: comfort care Code Status: Full Family Communication: Daughter at bedside and updated Disposition Plan: Status is: Inpatient Remains inpatient appropriate because: comfort care, hospice eval   Level of care: Progressive  Consultants:  Cardiology Pulmonary Nephro    Subjective:  Very agitated and delirious, hallucinating "I'm going to die" - daughter at bedside crying  Objective: Vitals:   03/03/23 0940 03/03/23 1644 03/03/23 1708 03/03/23 1932  BP: (!) 144/84 (!) 141/74  (!) 140/73  Pulse: 94 89  89  Resp:  17  (!) 22  Temp:  98.2 F (36.8 C)  98 F (36.7 C)  TempSrc:    Oral  SpO2:  97% 95% 96%  Weight:      Height:        Intake/Output Summary (Last 24 hours) at 03/03/2023 2057 Last data filed at 03/03/2023 2053 Gross per 24 hour  Intake 240 ml  Output 1850 ml  Net -1610 ml   Filed Weights   03/01/23 0500 03/02/23 0347 03/03/23 0349  Weight: 76.4 kg 76.1 kg 78.1 kg    Examination:  General exam: agitated Respiratory system: Crackles and rhonchi at bases.  Improved air entry.  Normal work of breathing.  12 L HFNC Cardiovascular system: S1-S2, RRR, no murmurs, no pedal edema Gastrointestinal system: Soft, NT/ND, normal bowel sounds Central nervous system: Awake, agitated, non-focal Extremities: Symmetric 5 x 5 power. Skin: No rashes, lesions or ulcers Psychiatry: delirious, hallucinating    Data Reviewed: I have personally reviewed following labs and imaging studies  CBC:  Recent Labs  Lab 02/27/23 0152 02/28/23 0434 03/01/23 0608 03/02/23 0456 03/03/23 0707  WBC 13.0* 10.6* 9.0 9.8 7.6  NEUTROABS 10.1*  --   --   --   --   HGB 10.7* 10.1* 10.9* 9.8* 9.6*  HCT 33.4* 32.1* 34.4* 30.6* 30.2*  MCV 72.9* 73.6* 72.9* 72.9* 72.9*  PLT 195 196 260 257 299   Basic Metabolic Panel: Recent Labs  Lab  02/27/23 0152 02/28/23 0434 03/01/23 0608 03/02/23 0456 03/03/23 0449  NA 128* 132* 130* 127* 128*  K 3.5 3.2* 4.0 3.9 4.4  CL 93* 97* 95* 94* 95*  CO2 22 24 23  21* 20*  GLUCOSE 154* 147* 175* 253* 225*  BUN 28* 38* 48* 53* 56*  CREATININE 1.45* 1.52* 1.72* 1.63* 1.40*  CALCIUM 8.1* 8.0* 8.5* 8.3* 8.6*  MG 2.1 2.1 2.6* 2.5* 2.3  PHOS  --   --  4.1 4.4 4.6   GFR: Estimated Creatinine Clearance: 43.4 mL/min (A) (by C-G formula based on SCr of 1.4 mg/dL (H)). Liver Function Tests: Recent Labs  Lab 02/25/23 2139 03/03/23 0449  AST 26 19  ALT 20 26  ALKPHOS 70 83  BILITOT 1.1 0.8  PROT 7.4 6.0*  ALBUMIN 4.3 3.1*   No results for input(s): "LIPASE", "AMYLASE" in the last 168 hours. No results for input(s): "AMMONIA" in the last 168 hours. Coagulation Profile: Recent Labs  Lab 02/25/23 1739 02/26/23 1700  INR 1.2 1.3*    CBG: Recent Labs  Lab 03/02/23 1553 03/02/23 2052 03/03/23 0858 03/03/23 1222 03/03/23 2051  GLUCAP 225* 225* 219* 236* 381*    Sepsis Labs: Recent Labs  Lab 02/25/23 1739 02/25/23 2209 02/25/23 2355 03/02/23 0456 03/03/23 0707  PROCALCITON 0.11  --   --  0.22 0.13  LATICACIDVEN  --  1.5 1.5  --   --     Recent Results (from the past 240 hours)  Resp panel by RT-PCR (RSV, Flu A&B, Covid) Anterior Nasal Swab     Status: None   Collection Time: 02/25/23 10:09 PM   Specimen: Anterior Nasal Swab  Result Value Ref Range Status   SARS Coronavirus 2 by RT PCR NEGATIVE NEGATIVE Final    Comment: (NOTE) SARS-CoV-2 target nucleic acids are NOT DETECTED.  The SARS-CoV-2 RNA is generally detectable in upper respiratory specimens during the acute phase of infection. The lowest concentration of SARS-CoV-2 viral copies this assay can detect is 138 copies/mL. A negative result does not preclude SARS-Cov-2 infection and should not be used as the sole basis for treatment or other patient management decisions. A negative result may occur with   improper specimen collection/handling, submission of specimen other than nasopharyngeal swab, presence of viral mutation(s) within the areas targeted by this assay, and inadequate number of viral copies(<138 copies/mL). A negative result must be combined with clinical observations, patient history, and epidemiological information. The expected result is Negative.  Fact Sheet for Patients:  BloggerCourse.com  Fact Sheet for Healthcare Providers:  SeriousBroker.it  This test is no t yet approved or cleared by the [place] FDA and  has been authorized for detection and/or diagnosis of SARS-CoV-2 by FDA under an Emergency Use Authorization (EUA). This EUA will remain  in effect (meaning this test can be used) for the duration of the COVID-19 declaration under Section 564(b)(1) of the Act, 21 U.S.C.section 360bbb-3(b)(1), unless the authorization is terminated  or revoked sooner.       Influenza A by PCR NEGATIVE NEGATIVE Final   Influenza  B by PCR NEGATIVE NEGATIVE Final    Comment: (NOTE) The Xpert Xpress SARS-CoV-2/FLU/RSV plus assay is intended as an aid in the diagnosis of influenza from Nasopharyngeal swab specimens and should not be used as a sole basis for treatment. Nasal washings and aspirates are unacceptable for Xpert Xpress SARS-CoV-2/FLU/RSV testing.  Fact Sheet for Patients: BloggerCourse.com  Fact Sheet for Healthcare Providers: SeriousBroker.it  This test is not yet approved or cleared by the [place] FDA and has been authorized for detection and/or diagnosis of SARS-CoV-2 by FDA under an Emergency Use Authorization (EUA). This EUA will remain in effect (meaning this test can be used) for the duration of the COVID-19 declaration under Section 564(b)(1) of the Act, 21 U.S.C. section 360bbb-3(b)(1), unless the authorization is terminated or revoked.      Resp Syncytial Virus by PCR NEGATIVE NEGATIVE Final    Comment: (NOTE) Fact Sheet for Patients: BloggerCourse.com  Fact Sheet for Healthcare Providers: SeriousBroker.it  This test is not yet approved or cleared by the [place] FDA and has been authorized for detection and/or diagnosis of SARS-CoV-2 by FDA under an Emergency Use Authorization (EUA). This EUA will remain in effect (meaning this test can be used) for the duration of the COVID-19 declaration under Section 564(b)(1) of the Act, 21 U.S.C. section 360bbb-3(b)(1), unless the authorization is terminated or revoked.  Performed at [facility], [address] [place], [place] [address]   Blood Culture (routine x 2)     Status: None   Collection Time: 02/25/23 10:10 PM   Specimen: BLOOD  Result Value Ref Range Status   Specimen Description BLOOD LEFT ASSIST CONTROL  Final   Special Requests   Final    BOTTLES DRAWN AEROBIC AND ANAEROBIC Blood Culture adequate volume   Culture   Final    NO GROWTH 5 DAYS Performed at [facility], [address] [place], [place] [address]    Report Status 03/02/2023 FINAL  Final  Blood Culture (routine x 2)     Status: None   Collection Time: 02/25/23 10:10 PM   Specimen: BLOOD  Result Value Ref Range Status   Specimen Description BLOOD RIGHT FOREARM  Final   Special Requests   Final    BOTTLES DRAWN AEROBIC AND ANAEROBIC Blood Culture adequate volume   Culture   Final    NO GROWTH 5 DAYS Performed at [facility], [address] [place], [place] [address]    Report Status 03/02/2023 FINAL  Final  MRSA Next Gen by PCR, Nasal     Status: None   Collection Time: 03/02/23  6:00 AM   Specimen: Nasal Mucosa; Nasal Swab  Result Value Ref Range Status   MRSA by PCR Next Gen NOT DETECTED NOT DETECTED Final    Comment: (NOTE) The GeneXpert MRSA Assay (FDA approved for NASAL specimens only), is one  component of a comprehensive MRSA colonization surveillance program. It is not intended to diagnose MRSA infection nor to guide or monitor treatment for MRSA infections. Test performance is not FDA approved in patients less than 36 years old. Performed at [facility], [address] [place], [place] [address]   Respiratory (~20 pathogens) panel by PCR     Status: None   Collection Time: 03/02/23  6:00 AM   Specimen: Nasopharyngeal Swab; Respiratory  Result Value Ref Range Status   Adenovirus NOT DETECTED NOT DETECTED Final   Coronavirus 229E NOT DETECTED NOT DETECTED Final    Comment: (NOTE) The  Coronavirus on the Respiratory Panel, DOES NOT test for the novel  Coronavirus (2019 nCoV)    Coronavirus HKU1 NOT DETECTED NOT DETECTED Final   Coronavirus NL63 NOT DETECTED NOT DETECTED Final   Coronavirus OC43 NOT DETECTED NOT DETECTED Final   Metapneumovirus NOT DETECTED NOT DETECTED Final   Rhinovirus / Enterovirus NOT DETECTED NOT DETECTED Final   Influenza A NOT DETECTED NOT DETECTED Final   Influenza B NOT DETECTED NOT DETECTED Final   Parainfluenza Virus 1 NOT DETECTED NOT DETECTED Final   Parainfluenza Virus 2 NOT DETECTED NOT DETECTED Final   Parainfluenza Virus 3 NOT DETECTED NOT DETECTED Final   Parainfluenza Virus 4 NOT DETECTED NOT DETECTED Final   Respiratory Syncytial Virus NOT DETECTED NOT DETECTED Final   Bordetella pertussis NOT DETECTED NOT DETECTED Final   Bordetella Parapertussis NOT DETECTED NOT DETECTED Final   Chlamydophila pneumoniae NOT DETECTED NOT DETECTED Final   Mycoplasma pneumoniae NOT DETECTED NOT DETECTED Final    Comment: Performed at [facility], [address] [place], [place] [address]         Radiology Studies: CT ABDOMEN W WO CONTRAST Result Date: 03/02/2023 CLINICAL DATA:  Renal mass. EXAM: CT ABDOMEN WITHOUT AND WITH CONTRAST TECHNIQUE: Multidetector CT imaging of the abdomen was performed following the standard  protocol before and following the bolus administration of intravenous contrast. RADIATION DOSE REDUCTION: This exam was performed according to the departmental dose-optimization program which includes automated exposure control, adjustment of the mA and/or kV according to patient size and/or use of iterative reconstruction technique. CONTRAST:  80mL OMNIPAQUE IOHEXOL 300 MG/ML  SOLN COMPARISON:  Chest CT without contrast 03/01/2023. Old CT scan abdomen pelvis 2011 February FINDINGS: Lower chest: Moderate pleural effusions are again seen as on the prior chest CT scan with the adjacent lung opacity. Please correlate with the chest CT. Calcified nodule in the right lung. Enlarged heart. Hepatobiliary: Gallstone seen in the nondilated gallbladder. Preserved hepatic parenchyma without obvious mass. Patent portal vein. Pancreas: Atrophy of the pancreas.  No obvious pancreatic mass Spleen: Normal in size without focal abnormality. Adrenals/Urinary Tract: The adrenal glands are preserved. Mild left-sided renal atrophy. There is some heterogeneous enhancement of the upper pole left kidney, nonspecific. Few tiny low-attenuation lesions identified as well which are too small to completely characterize but likely benign cystic focus. There is 1 lesion however along the upper pole left kidney which may have a thickened wall measuring overall 13 x 11 mm on coronal images and axial series 17 image 14 AP dimension of 10 mm. Wall is somewhat more thick than expected. Wall thickness approaches 2-3 mm. Bosniak 2 Fahrenheit lesion but suspicious. No left-sided renal collecting system dilatation. The right kidney also has some Bosniak 2 small cystic foci. More preserved renal parenchyma. Along the upper pole exophytic laterally is a rim calcified cystic focus measuring 3.6 by 3.1 cm today. Going back to 2011 this lesion when measured in the same fashion as today would measure 3.7 3.2 cm, not significantly increased in size. There are  more dystrophic calcifications today. However immediately caudal to this laterally and partially exophytic is a more solid-appearing lesion today measuring 3.3 by 3.5 cm. This lesion on precontrast has Hounsfield unit of 26, arterial phase of 22, nephrographic phase of 38 and on delay 48. With this type of enhancement this could be a slow growing renal cell carcinoma, papillary subtype. This has much smaller in 2011 measuring 2.7 cm. Stomach/Bowel: On this non oral contrast exam,  the visualized bowel is nondilated. Significant colonic stool identified. Stomach is mildly distended with fluid and air. Wall thickening seen along the lower esophagus. Vascular/Lymphatic: Significant atherosclerotic calcified plaque identified along the native aorta with the areas extending into the branch vessels. Significant stenosis suggested of several vessels including the renal arteries bilaterally. The SMA, hepatic artery origin. There is evidence of the aorta bypass not included in the imaging field in its entirety. Please correlate with surgical history. No specific abnormal lymph node enlargement identified in the visualized portions of the abdomen. Other: No free air or free fluid identified. Protuberance along the anterior abdominal wall with small fat containing hernia seen just above the umbilicus. Musculoskeletal: Curvature of the spine with some degenerative changes. IMPRESSION: Enlarging enhancing mass in the right kidney laterally measuring 3.3 x 3.5 cm. This has worrisome for developing renal cell carcinoma. With the enhancement pattern greatest on the most delayed this could be a papillary subtype. Stable in size complex lesion more superior to this laterally in the right kidney with increasing dystrophic calcification. Benign lesion. There is mildly complex lesion along the upper pole left kidney which has a slightly thickened wall, recommend follow up in 6 months. Bosniak 2 F Significant calcified atherosclerotic  plaque along the aorta and branch vessels with multiple areas of potential significant stenosis including the SMA, bilateral renal arteries. Please correlate with surgical and clinical history. Gallstones. Bilateral pleural effusions and opacities seen. Please correlate with separate chest CT scan. Significant colonic stool. Electronically Signed   By: Karen Kays M.D.   On: 03/02/2023 16:28         Scheduled Meds:  risperiDONE  0.5 mg Oral QHS   Continuous Infusions:     LOS: 6 days   Time spent 35 minutes  Delfino Lovett, MD [facility]   If 7PM-7AM, please contact night-coverage  03/03/2023, 8:57 PM

## 2023-03-03 NOTE — Plan of Care (Signed)
  Problem: Coping: Goal: Ability to adjust to condition or change in health will improve Outcome: Progressing   

## 2023-03-03 NOTE — Consult Note (Signed)
Pharmacy Consult Note - Anticoagulation  Pharmacy Consult for heparin Indication: chest pain/ACS  PATIENT MEASUREMENTS: Height: 6\' 3"  (190.5 cm) Weight: 78.1 kg (172 lb 2.9 oz) IBW/kg (Calculated) : 84.5 HEPARIN DW (KG): 83.9  VITAL SIGNS: Temp: 97.8 F (36.6 C) (12/18 0337) Temp Source: Oral (12/18 0337) BP: 152/58 (12/18 0337)  Recent Labs    02/28/23 0945 02/28/23 1405 03/02/23 0456 03/03/23 0449  HGB  --    < > 9.8*  --   HCT  --    < > 30.6*  --   PLT  --    < > 257  --   HEPARINUNFRC  --    < > 0.58 0.76*  CREATININE  --    < > 1.63* 1.40*  TROPONINIHS 2,483*  --   --   --    < > = values in this interval not displayed.    Estimated Creatinine Clearance: 43.4 mL/min (A) (by C-G formula based on SCr of 1.4 mg/dL (H)).  PAST MEDICAL HISTORY: Past Medical History:  Diagnosis Date   CHF (congestive heart failure) (HCC)    Diabetes mellitus without complication (HCC)    Heart attack (HCC)    Hyperlipidemia    Hypertension     ASSESSMENT: 82 y.o. male with PMH CHF, HTN, HLD is presenting with chest pain. cTn trending up. Patient is not on chronic anticoagulation per chart review. Pharmacy has been consulted to initiate and manage heparin intravenous infusion.  Pertinent medications: No chronic AC prior to admission per chart review  Goal(s) of therapy: Heparin level 0.3 - 0.7 units/mL Monitor platelets by anticoagulation protocol: Yes   Baseline anticoagulation labs: Recent Labs    03/01/23 0608 03/02/23 0456  HGB 10.9* 9.8*  PLT 260 257    Date Time aPTT/HL Rate/Comment 12/14 0152 HL 0.46 Therapeutic x 1  12/14 1020 HL 0.28  Subtherapeutic  12/14   2002   HL 0.31            Therapeutic x 1 12/15 0434 HL 0.27  Subtherapeutic 12/15 1405 HL 0.37 12/15 2208 HL 0.30 Therapeutic x 2 12/16 0608 HL 0.27 Subtherapeutic 12/16 1428 HL 0.48 Therapeutic x1 12/16   2223    HL 0.54           Therapeutic X 2  12/17   0456    HL 0.58           Therapeutic X 3   12/18   0449    HL 0.76           SUPRAtherapeutic   PLAN: 12/18: HL @ 0449 = 0.76, SUPRAtherapeutic - Will decrease heparin drip rate to 1350 units/hr and recheck HL 8 hrs after rate change    Thank you for involving pharmacy in this patient's care.   Teliah Buffalo D Clinical Pharmacist 03/03/2023 7:05 AM

## 2023-03-03 NOTE — IPAL (Signed)
  Interdisciplinary Goals of Care Family Meeting   Date carried out: 03/03/2023  Location of the meeting: Bedside  Member's involved: Physician and Family Member or next of kin/daughter  Durable Power of Attorney or Environmental health practitioner: daughter    Discussion: We discussed goals of care for [employer] .    Code status:   Code Status: Do not attempt resuscitation (DNR) - Comfort care   Disposition: In-patient comfort care  Time spent for the meeting: 35 mins    Delfino Lovett, MD  03/03/2023, 10:21 AM

## 2023-03-03 NOTE — Progress Notes (Signed)
[facility]- Civil engineer, contracting   Received a referral to evaluate patient for the Hospice Home and to discuss Hospice services at a LTC facility.  Patient does not appear to be Hospice Home appropriate at this time, but will re-evaluate tomorrow since patient was just transitioned to comfort care.  Daughter and son in law aware and agreeable to this plan.  HL also spoke with daughter and son in law about Hospice services at a LTC facility.   Please don't hesitate to call with any Hospice related questions or concerns.    Thank you for the opportunity to participate in this patient's care. [facility] Liaison [phone]

## 2023-03-04 DIAGNOSIS — R0902 Hypoxemia: Secondary | ICD-10-CM | POA: Diagnosis not present

## 2023-03-04 DIAGNOSIS — Z515 Encounter for palliative care: Secondary | ICD-10-CM | POA: Diagnosis not present

## 2023-03-04 DIAGNOSIS — F0390 Unspecified dementia without behavioral disturbance: Secondary | ICD-10-CM | POA: Diagnosis not present

## 2023-03-04 DIAGNOSIS — J189 Pneumonia, unspecified organism: Secondary | ICD-10-CM | POA: Diagnosis not present

## 2023-03-04 DIAGNOSIS — I509 Heart failure, unspecified: Secondary | ICD-10-CM | POA: Diagnosis not present

## 2023-03-04 DIAGNOSIS — I5023 Acute on chronic systolic (congestive) heart failure: Secondary | ICD-10-CM | POA: Diagnosis not present

## 2023-03-04 DIAGNOSIS — J9601 Acute respiratory failure with hypoxia: Secondary | ICD-10-CM | POA: Diagnosis not present

## 2023-03-04 NOTE — Progress Notes (Signed)
Palliative Care Progress Note, Assessment & Plan   Patient Name: Christopher Snow       Date: 03/04/2023 DOB: 1941-03-16  Age: 82 y.o. MRN#: [id number] Attending Physician: Christopher Lovett, MD Primary Care Physician: Christopher Nurse, MD Admit Date: 02/25/2023  Subjective: Patient is lying in bed, resting, in no apparent distress.  His respirations are even and unlabored.  He does not awaken to my presence.  His daughter Christopher Snow is at bedside during my visit.  HPI: 82 y.o. male  with past medical history of sCHF with EF 30-35% on most recent echo, HTN, HLD, DM, CAD s/p CABG, PVD and dementia admitted from home on 02/25/2023 with chest pain.   Found to be hypoxic in ED, placed on 4L [place] which has now progressed to [facility] which he remains on. BNP 822, CXR revealing vascular congestion-admitted and being treated for acute on chronic CHF with acute hypoxia, possible NSTEMI and hypertensive emergency.   After discussion with attending this morning, family in agreement to shift to full comfort measures.   Palliative medicine was consulted for assisting with goals of care conversations  Summary of counseling/coordination of care: Extensive chart review completed prior to meeting patient including labs, vital signs, imaging, progress notes, orders, and available advanced directive documents from current and previous encounters.   After reviewing the patient's chart and assessing the patient at bedside, I spoke with patient's daughter at bedside in regards to symptom management and goals of care.  Symptoms assessed.  Patient not exhibiting signs of nonverbal distress such as grimacing, brow furrowing, or fidgeting.  His respirations are even and unlabored.  Patient's daughter endorses patient has been resting  peacefully and quietly during her visit.  Space and opportunity provided for daughter to share her thoughts and emotions regarding patient's current health status.  She shares that there are a lot of unknowns and she is concerned about how her mother is going to handle visiting him.  She shares they have been married for 62 years so she knows this will be hard on her.  Therapeutic silence, active listening, and emotional support provided.  We discussed that patient is not appropriate for IPU placement at this time.  Patient is unable to return with hospice services at home as patient's family is unable to care for him.  Plan is for patient to remain in hospital until disposition planning is determined.  Christopher Snow is grateful for a single bedroom.  She is wondering if he will be transferred to a different room.  I shared that patient should remain in his own private room and that full comfort measures will continue.  She was grateful for my visit.  Her mother/patient's wife plans to visit this afternoon.  I shared that PMT remains available to patient and family throughout his hospitalization.  Full comfort measures continue.  No adjustment to [facility] or plan of care at this time.  Physical Exam Vitals reviewed.  Constitutional:      General: He is not in acute distress.    Appearance: He is not ill-appearing.  HENT:     Head: Normocephalic.  Pulmonary:     Effort: Pulmonary effort is normal.  Skin:  General: Skin is warm and dry.     Coloration: Skin is pale.  Psychiatric:        Mood and Affect: Mood is not anxious.        Behavior: Behavior is not agitated.             Total Time 35 minutes   Time spent includes: Detailed review of medical records (labs, imaging, vital signs), medically appropriate exam (mental status, respiratory, cardiac, skin), discussed with treatment team, counseling and educating patient, family and staff, documenting clinical information, medication management and  coordination of care.  Christopher Deist L. Bonita Quin, DNP, FNP-BC Palliative Medicine Team

## 2023-03-04 NOTE — Progress Notes (Signed)
PROGRESS NOTE    Christopher Snow  GNF:[id number] DOB: 1941/03/13 DOA: 02/25/2023 PCP: Gracelyn Nurse, MD    Brief Narrative:  82 y.o. male with medical history significant of sCHF with EF 35-405,  HTN, HLD, DM, CAD, CBAG, PVD, dementia, who presents with chest pain.   Per his daughter at bedside, pt started having chest pain since afternoon.  The chest pain is located in left side of chest, radiating to the left arm.  Patient cannot characterize his pain in detail due to dementia.  Patient has mild dry cough.  Patient does not have significant shortness breath, but was found to have oxygen desaturation to 80s on room air in ED, which improved to 92-96% on 4 L of oxygen.  Patient is normally not using oxygen at home.  Patient blood pressure is significantly elevated with SBP 200s, nitroglycerin drip is started in ED.   12/15: Palliative care consult.  Repleting electrolytes, weaning off nitroglycerin drip 12/16: CT chest without contrast, pulmonary and nephro consult 12/17: CT abdomen and pelvis for further evaluation renal mass.  Renal ultrasound pending, Decadron started 12/18: terminal agitation/delirium? GOC - Comfort care initiated 12/19: Full comfort care   Assessment & Plan:   Principal Problem:   Acute on chronic systolic CHF (congestive heart failure) (HCC) Active Problems:   Acute respiratory failure with hypoxia (HCC)   Hypertensive emergency   CAD (coronary artery disease)   Myocardial injury   HLD (hyperlipidemia)   Diabetes mellitus with peripheral circulatory disorder (HCC)   Hyponatremia   Leukocytosis   Dementia without behavioral disturbance (HCC)   Hospice care  Acute respiratory failure with hypoxia due to acute on chronic systolic CHF (congestive heart failure) (HCC):  Hypertensive emergency:  CAD (coronary artery disease) Elevated troponins HLD (hyperlipidemia) Diabetes mellitus with peripheral circulatory disorder [facility]):  Hyponatremia:   Leukocytosis:  Dementia without behavioral disturbance (HCC) AKI Agitation - could be terminal vs acute delirum  Family seeking full comfort care.  Not appropriate for hospice home yet   DVT prophylaxis: comfort care Code Status: Full Family Communication: Daughter at bedside and updated Disposition Plan: Status is: Inpatient Remains inpatient appropriate because: comfort care, hospice eval   Level of care: Med-Surg  Consultants:  Cardiology Pulmonary Nephro    Subjective:  Remains calm and comfortable.  Daughter at bedside  Objective: Vitals:   03/03/23 1708 03/03/23 1932 03/03/23 2252 03/04/23 0856  BP:  (!) 140/73 124/69 (!) 147/68  Pulse:  89 65 60  Resp:  (!) 22 (!) 22   Temp:  98 F (36.7 C) (!) 97 F (36.1 C) (!) 97 F (36.1 C)  TempSrc:  Oral  Axillary  SpO2: 95% 96% 97% 97%  Weight:      Height:        Intake/Output Summary (Last 24 hours) at 03/04/2023 1216 Last data filed at 03/04/2023 8469 Gross per 24 hour  Intake 240 ml  Output 1400 ml  Net -1160 ml   Filed Weights   03/01/23 0500 03/02/23 0347 03/03/23 0349  Weight: 76.4 kg 76.1 kg 78.1 kg    Examination:  General exam: Calm and comfortable Respiratory system:  Normal work of breathing.  5 L HFNC Cardiovascular system: S1-S2, RRR, no murmurs, no pedal edema Gastrointestinal system: Soft, benign Central nervous system: Obtunded, non-focal    Data Reviewed: I have personally reviewed following labs and imaging studies  CBC: Recent Labs  Lab 02/27/23 0152 02/28/23 0434 03/01/23 0608 03/02/23 0456 03/03/23 0707  WBC  13.0* 10.6* 9.0 9.8 7.6  NEUTROABS 10.1*  --   --   --   --   HGB 10.7* 10.1* 10.9* 9.8* 9.6*  HCT 33.4* 32.1* 34.4* 30.6* 30.2*  MCV 72.9* 73.6* 72.9* 72.9* 72.9*  PLT 195 196 260 257 299   Basic Metabolic Panel: Recent Labs  Lab 02/27/23 0152 02/28/23 0434 03/01/23 0608 03/02/23 0456 03/03/23 0449  NA 128* 132* 130* 127* 128*  K 3.5 3.2* 4.0 3.9 4.4   CL 93* 97* 95* 94* 95*  CO2 22 24 23  21* 20*  GLUCOSE 154* 147* 175* 253* 225*  BUN 28* 38* 48* 53* 56*  CREATININE 1.45* 1.52* 1.72* 1.63* 1.40*  CALCIUM 8.1* 8.0* 8.5* 8.3* 8.6*  MG 2.1 2.1 2.6* 2.5* 2.3  PHOS  --   --  4.1 4.4 4.6   GFR: Estimated Creatinine Clearance: 43.4 mL/min (A) (by C-G formula based on SCr of 1.4 mg/dL (H)). Liver Function Tests: Recent Labs  Lab 02/25/23 2139 03/03/23 0449  AST 26 19  ALT 20 26  ALKPHOS 70 83  BILITOT 1.1 0.8  PROT 7.4 6.0*  ALBUMIN 4.3 3.1*   No results for input(s): "LIPASE", "AMYLASE" in the last 168 hours. No results for input(s): "AMMONIA" in the last 168 hours. Coagulation Profile: Recent Labs  Lab 02/25/23 1739 02/26/23 1700  INR 1.2 1.3*    CBG: Recent Labs  Lab 03/02/23 1553 03/02/23 2052 03/03/23 0858 03/03/23 1222 03/03/23 2051  GLUCAP 225* 225* 219* 236* 381*    Sepsis Labs: Recent Labs  Lab 02/25/23 1739 02/25/23 2209 02/25/23 2355 03/02/23 0456 03/03/23 0707  PROCALCITON 0.11  --   --  0.22 0.13  LATICACIDVEN  --  1.5 1.5  --   --     Recent Results (from the past 240 hours)  Resp panel by RT-PCR (RSV, Flu A&B, Covid) Anterior Nasal Swab     Status: None   Collection Time: 02/25/23 10:09 PM   Specimen: Anterior Nasal Swab  Result Value Ref Range Status   SARS Coronavirus 2 by RT PCR NEGATIVE NEGATIVE Final    Comment: (NOTE) SARS-CoV-2 target nucleic acids are NOT DETECTED.  The SARS-CoV-2 RNA is generally detectable in upper respiratory specimens during the acute phase of infection. The lowest concentration of SARS-CoV-2 viral copies this assay can detect is 138 copies/mL. A negative result does not preclude SARS-Cov-2 infection and should not be used as the sole basis for treatment or other patient management decisions. A negative result may occur with  improper specimen collection/handling, submission of specimen other than nasopharyngeal swab, presence of viral mutation(s) within  the areas targeted by this assay, and inadequate number of viral copies(<138 copies/mL). A negative result must be combined with clinical observations, patient history, and epidemiological information. The expected result is Negative.  Fact Sheet for Patients:  BloggerCourse.com  Fact Sheet for Healthcare Providers:  SeriousBroker.it  This test is no t yet approved or cleared by the [place] FDA and  has been authorized for detection and/or diagnosis of SARS-CoV-2 by FDA under an Emergency Use Authorization (EUA). This EUA will remain  in effect (meaning this test can be used) for the duration of the COVID-19 declaration under Section 564(b)(1) of the Act, 21 U.S.C.section 360bbb-3(b)(1), unless the authorization is terminated  or revoked sooner.       Influenza A by PCR NEGATIVE NEGATIVE Final   Influenza B by PCR NEGATIVE NEGATIVE Final    Comment: (NOTE) The Xpert Xpress SARS-CoV-2/FLU/RSV plus  assay is intended as an aid in the diagnosis of influenza from Nasopharyngeal swab specimens and should not be used as a sole basis for treatment. Nasal washings and aspirates are unacceptable for Xpert Xpress SARS-CoV-2/FLU/RSV testing.  Fact Sheet for Patients: BloggerCourse.com  Fact Sheet for Healthcare Providers: SeriousBroker.it  This test is not yet approved or cleared by the [place] FDA and has been authorized for detection and/or diagnosis of SARS-CoV-2 by FDA under an Emergency Use Authorization (EUA). This EUA will remain in effect (meaning this test can be used) for the duration of the COVID-19 declaration under Section 564(b)(1) of the Act, 21 U.S.C. section 360bbb-3(b)(1), unless the authorization is terminated or revoked.     Resp Syncytial Virus by PCR NEGATIVE NEGATIVE Final    Comment: (NOTE) Fact Sheet for  Patients: BloggerCourse.com  Fact Sheet for Healthcare Providers: SeriousBroker.it  This test is not yet approved or cleared by the [place] FDA and has been authorized for detection and/or diagnosis of SARS-CoV-2 by FDA under an Emergency Use Authorization (EUA). This EUA will remain in effect (meaning this test can be used) for the duration of the COVID-19 declaration under Section 564(b)(1) of the Act, 21 U.S.C. section 360bbb-3(b)(1), unless the authorization is terminated or revoked.  Performed at [facility] [place], [address] [place], [place] [address]   Blood Culture (routine x 2)     Status: None   Collection Time: 02/25/23 10:10 PM   Specimen: BLOOD  Result Value Ref Range Status   Specimen Description BLOOD LEFT ASSIST CONTROL  Final   Special Requests   Final    BOTTLES DRAWN AEROBIC AND ANAEROBIC Blood Culture adequate volume   Culture   Final    NO GROWTH 5 DAYS Performed at [facility], [address], [place], [place] [address]    Report Status 03/02/2023 FINAL  Final  Blood Culture (routine x 2)     Status: None   Collection Time: 02/25/23 10:10 PM   Specimen: BLOOD  Result Value Ref Range Status   Specimen Description BLOOD RIGHT FOREARM  Final   Special Requests   Final    BOTTLES DRAWN AEROBIC AND ANAEROBIC Blood Culture adequate volume   Culture   Final    NO GROWTH 5 DAYS Performed at [facility], [address], [place], [place] [address]    Report Status 03/02/2023 FINAL  Final  MRSA Next Gen by PCR, Nasal     Status: None   Collection Time: 03/02/23  6:00 AM   Specimen: Nasal Mucosa; Nasal Swab  Result Value Ref Range Status   MRSA by PCR Next Gen NOT DETECTED NOT DETECTED Final    Comment: (NOTE) The GeneXpert MRSA Assay (FDA approved for NASAL specimens only), is one component of a comprehensive MRSA colonization surveillance program. It is not intended to  diagnose MRSA infection nor to guide or monitor treatment for MRSA infections. Test performance is not FDA approved in patients less than 76 years old. Performed at [facility], [address] [place], [place] [address]   Respiratory (~20 pathogens) panel by PCR     Status: None   Collection Time: 03/02/23  6:00 AM   Specimen: Nasopharyngeal Swab; Respiratory  Result Value Ref Range Status   Adenovirus NOT DETECTED NOT DETECTED Final   Coronavirus 229E NOT DETECTED NOT DETECTED Final    Comment: (NOTE) The Coronavirus on the Respiratory Panel, DOES NOT test for the novel  Coronavirus (2019 nCoV)  Coronavirus HKU1 NOT DETECTED NOT DETECTED Final   Coronavirus NL63 NOT DETECTED NOT DETECTED Final   Coronavirus OC43 NOT DETECTED NOT DETECTED Final   Metapneumovirus NOT DETECTED NOT DETECTED Final   Rhinovirus / Enterovirus NOT DETECTED NOT DETECTED Final   Influenza A NOT DETECTED NOT DETECTED Final   Influenza B NOT DETECTED NOT DETECTED Final   Parainfluenza Virus 1 NOT DETECTED NOT DETECTED Final   Parainfluenza Virus 2 NOT DETECTED NOT DETECTED Final   Parainfluenza Virus 3 NOT DETECTED NOT DETECTED Final   Parainfluenza Virus 4 NOT DETECTED NOT DETECTED Final   Respiratory Syncytial Virus NOT DETECTED NOT DETECTED Final   Bordetella pertussis NOT DETECTED NOT DETECTED Final   Bordetella Parapertussis NOT DETECTED NOT DETECTED Final   Chlamydophila pneumoniae NOT DETECTED NOT DETECTED Final   Mycoplasma pneumoniae NOT DETECTED NOT DETECTED Final    Comment: Performed at [facility], [address], [place], [place] [address]         Radiology Studies: CT ABDOMEN W WO CONTRAST Result Date: 03/02/2023 CLINICAL DATA:  Renal mass. EXAM: CT ABDOMEN WITHOUT AND WITH CONTRAST TECHNIQUE: Multidetector CT imaging of the abdomen was performed following the standard protocol before and following the bolus administration of intravenous contrast. RADIATION DOSE  REDUCTION: This exam was performed according to the departmental dose-optimization program which includes automated exposure control, adjustment of the mA and/or kV according to patient size and/or use of iterative reconstruction technique. CONTRAST:  80mL OMNIPAQUE IOHEXOL 300 MG/ML  SOLN COMPARISON:  Chest CT without contrast 03/01/2023. Old CT scan abdomen pelvis 2011 February FINDINGS: Lower chest: Moderate pleural effusions are again seen as on the prior chest CT scan with the adjacent lung opacity. Please correlate with the chest CT. Calcified nodule in the right lung. Enlarged heart. Hepatobiliary: Gallstone seen in the nondilated gallbladder. Preserved hepatic parenchyma without obvious mass. Patent portal vein. Pancreas: Atrophy of the pancreas.  No obvious pancreatic mass Spleen: Normal in size without focal abnormality. Adrenals/Urinary Tract: The adrenal glands are preserved. Mild left-sided renal atrophy. There is some heterogeneous enhancement of the upper pole left kidney, nonspecific. Few tiny low-attenuation lesions identified as well which are too small to completely characterize but likely benign cystic focus. There is 1 lesion however along the upper pole left kidney which may have a thickened wall measuring overall 13 x 11 mm on coronal images and axial series 17 image 14 AP dimension of 10 mm. Wall is somewhat more thick than expected. Wall thickness approaches 2-3 mm. Bosniak 2 Fahrenheit lesion but suspicious. No left-sided renal collecting system dilatation. The right kidney also has some Bosniak 2 small cystic foci. More preserved renal parenchyma. Along the upper pole exophytic laterally is a rim calcified cystic focus measuring 3.6 by 3.1 cm today. Going back to 2011 this lesion when measured in the same fashion as today would measure 3.7 3.2 cm, not significantly increased in size. There are more dystrophic calcifications today. However immediately caudal to this laterally and partially  exophytic is a more solid-appearing lesion today measuring 3.3 by 3.5 cm. This lesion on precontrast has Hounsfield unit of 26, arterial phase of 22, nephrographic phase of 38 and on delay 48. With this type of enhancement this could be a slow growing renal cell carcinoma, papillary subtype. This has much smaller in 2011 measuring 2.7 cm. Stomach/Bowel: On this non oral contrast exam, the visualized bowel is nondilated. Significant colonic stool identified. Stomach is mildly distended with fluid and air. Wall  thickening seen along the lower esophagus. Vascular/Lymphatic: Significant atherosclerotic calcified plaque identified along the native aorta with the areas extending into the branch vessels. Significant stenosis suggested of several vessels including the renal arteries bilaterally. The SMA, hepatic artery origin. There is evidence of the aorta bypass not included in the imaging field in its entirety. Please correlate with surgical history. No specific abnormal lymph node enlargement identified in the visualized portions of the abdomen. Other: No free air or free fluid identified. Protuberance along the anterior abdominal wall with small fat containing hernia seen just above the umbilicus. Musculoskeletal: Curvature of the spine with some degenerative changes. IMPRESSION: Enlarging enhancing mass in the right kidney laterally measuring 3.3 x 3.5 cm. This has worrisome for developing renal cell carcinoma. With the enhancement pattern greatest on the most delayed this could be a papillary subtype. Stable in size complex lesion more superior to this laterally in the right kidney with increasing dystrophic calcification. Benign lesion. There is mildly complex lesion along the upper pole left kidney which has a slightly thickened wall, recommend follow up in 6 months. Bosniak 2 F Significant calcified atherosclerotic plaque along the aorta and branch vessels with multiple areas of potential significant stenosis  including the SMA, bilateral renal arteries. Please correlate with surgical and clinical history. Gallstones. Bilateral pleural effusions and opacities seen. Please correlate with separate chest CT scan. Significant colonic stool. Electronically Signed   By: Karen Kays M.D.   On: 03/02/2023 16:28         Scheduled Meds:  risperiDONE  0.5 mg Oral QHS   Continuous Infusions:     LOS: 7 days   Time spent 35 minutes  Delfino Lovett, MD [facility]   If 7PM-7AM, please contact night-coverage  03/04/2023, 12:16 PM

## 2023-03-04 NOTE — Progress Notes (Addendum)
[facility]- Civil engineer, contracting   Patient remains inappropriate for the Hospice Home at this time.  Patient's daughter and hospital team aware.  Daughter informed that Hospice can follow patient post discharge from the hospital once a discharge disposition has been determined.    Please don't hesitate to call with any Hospice related questions or concerns.    Thank you for the opportunity to participate in this patient's care. [facility] Liaison [phone]

## 2023-03-04 NOTE — Consult Note (Signed)
[facility] Liaison Note  03/04/2023  Christopher Snow [facility] May 26, 1940 [id number]  Location: RN Hospital Liaison screened the patient remotely at [facility].  Insurance: [employer]   Christopher Snow is a 82 y.o. male who is a Primary Care Patient of Gracelyn Nurse, MD-[facility]. The patient was screened for  readmission hospitalization with noted high risk score for unplanned readmission risk with 1 IP in 6 months.  The patient was assessed for potential Care Management service needs for post hospital transition for care coordination. Review of patient's electronic medical record reveals patient  was admitted with CHF. Family met with Palliative team today and pt will transition to full comfort measures. No anticipated needs for VBCI services at this time.  VBCI Care Management/Population Health does not replace or interfere with any arrangements made by the Inpatient Transition of Care team.   For questions contact:   Elliot Cousin, RN, [facility] Liaison [place]   [facility], Population Health Office Hours MTWF  8:00 am-6:00 pm Direct Dial: [phone] mobile [phone] [Office toll free line] Office Hours are M-F 8:30 - 5 pm [email or web address]

## 2023-03-05 DIAGNOSIS — J189 Pneumonia, unspecified organism: Secondary | ICD-10-CM

## 2023-03-05 DIAGNOSIS — I509 Heart failure, unspecified: Secondary | ICD-10-CM | POA: Diagnosis not present

## 2023-03-05 DIAGNOSIS — R0902 Hypoxemia: Secondary | ICD-10-CM | POA: Diagnosis not present

## 2023-03-05 DIAGNOSIS — I5023 Acute on chronic systolic (congestive) heart failure: Secondary | ICD-10-CM | POA: Diagnosis not present

## 2023-03-05 MED ORDER — RISPERIDONE 0.5 MG PO TABS: 0.5000 mg | ORAL_TABLET | Freq: Every day | ORAL | 0 refills | Status: AC

## 2023-03-05 NOTE — Progress Notes (Signed)
[facility]- Civil engineer, contracting  Daughter has decided to take patient home today with Hospice services.  DME needs include a hospital bed, over the bed table, wheelchair and home 02.  Patient already has a BSC and shower chair.  Daughter to notify [facility] when DME arrives so EMS can be called to transport home.  Referral for home Hospice submitted.    Please don't hesitate to call with any Hospice related questions or concerns.    Thank you for the opportunity to participate in this patient's care. [facility] Liaison [phone]

## 2023-03-05 NOTE — Plan of Care (Signed)
  Problem: Coping: Goal: Ability to adjust to condition or change in health will improve Outcome: Progressing   Problem: Health Behavior/Discharge Planning: Goal: Ability to identify and utilize available resources and services will improve Outcome: Progressing   Problem: Nutritional: Goal: Maintenance of adequate nutrition will improve Outcome: Progressing   Problem: Skin Integrity: Goal: Risk for impaired skin integrity will decrease Outcome: Progressing   Problem: Tissue Perfusion: Goal: Adequacy of tissue perfusion will improve Outcome: Progressing   Problem: Clinical Measurements: Goal: Ability to maintain clinical measurements within normal limits will improve Outcome: Progressing   Problem: Coping: Goal: Level of anxiety will decrease Outcome: Progressing   Problem: Nutrition: Goal: Adequate nutrition will be maintained Outcome: Progressing

## 2023-03-05 NOTE — Progress Notes (Signed)
AVS provided and reviewed with patient and daughter. PIV removed. All questions answered.

## 2023-03-05 NOTE — Plan of Care (Signed)
  Problem: Education: Goal: Ability to describe self-care measures that may prevent or decrease complications (Diabetes Survival Skills Education) will improve Outcome: Progressing Goal: Individualized Educational Video(s) Outcome: Progressing   Problem: Coping: Goal: Ability to adjust to condition or change in health will improve Outcome: Progressing   Problem: Fluid Volume: Goal: Ability to maintain a balanced intake and output will improve Outcome: Progressing   Problem: Health Behavior/Discharge Planning: Goal: Ability to identify and utilize available resources and services will improve Outcome: Progressing Goal: Ability to manage health-related needs will improve Outcome: Progressing   Problem: Metabolic: Goal: Ability to maintain appropriate glucose levels will improve Outcome: Progressing   Problem: Nutritional: Goal: Maintenance of adequate nutrition will improve Outcome: Progressing Goal: Progress toward achieving an optimal weight will improve Outcome: Progressing   Problem: Skin Integrity: Goal: Risk for impaired skin integrity will decrease Outcome: Progressing   Problem: Tissue Perfusion: Goal: Adequacy of tissue perfusion will improve Outcome: Progressing   Problem: Education: Goal: Knowledge of General Education information will improve Description: Including pain rating scale, medication(s)/side effects and non-pharmacologic comfort measures Outcome: Progressing   Problem: Health Behavior/Discharge Planning: Goal: Ability to manage health-related needs will improve Outcome: Progressing   Problem: Clinical Measurements: Goal: Ability to maintain clinical measurements within normal limits will improve Outcome: Progressing Goal: Will remain free from infection Outcome: Progressing Goal: Diagnostic test results will improve Outcome: Progressing Goal: Respiratory complications will improve Outcome: Progressing Goal: Cardiovascular complication will  be avoided Outcome: Progressing   Problem: Activity: Goal: Risk for activity intolerance will decrease Outcome: Progressing   Problem: Nutrition: Goal: Adequate nutrition will be maintained Outcome: Progressing   Problem: Coping: Goal: Level of anxiety will decrease Outcome: Progressing   Problem: Elimination: Goal: Will not experience complications related to bowel motility Outcome: Progressing Goal: Will not experience complications related to urinary retention Outcome: Progressing   Problem: Pain Management: Goal: General experience of comfort will improve Outcome: Progressing   Problem: Safety: Goal: Ability to remain free from injury will improve Outcome: Progressing   Problem: Skin Integrity: Goal: Risk for impaired skin integrity will decrease Outcome: Progressing   Problem: Education: Goal: Ability to demonstrate management of disease process will improve Outcome: Progressing Goal: Ability to verbalize understanding of medication therapies will improve Outcome: Progressing Goal: Individualized Educational Video(s) Outcome: Progressing   Problem: Activity: Goal: Capacity to carry out activities will improve Outcome: Progressing   Problem: Cardiac: Goal: Ability to achieve and maintain adequate cardiopulmonary perfusion will improve Outcome: Progressing   Problem: Education: Goal: Knowledge of the prescribed therapeutic regimen will improve Outcome: Progressing   Problem: Coping: Goal: Ability to identify and develop effective coping behavior will improve Outcome: Progressing   Problem: Clinical Measurements: Goal: Quality of life will improve Outcome: Progressing   Problem: Respiratory: Goal: Verbalizations of increased ease of respirations will increase Outcome: Progressing   Problem: Role Relationship: Goal: Family's ability to cope with current situation will improve Outcome: Progressing Goal: Ability to verbalize concerns, feelings, and  thoughts to partner or family member will improve Outcome: Progressing   Problem: Pain Management: Goal: Satisfaction with pain management regimen will improve Outcome: Progressing

## 2023-03-05 NOTE — Discharge Summary (Signed)
Physician Discharge Summary   Patient: Christopher Snow MRN: [id number] DOB: 1941/01/26  Admit date:     02/25/2023  Discharge date: 03/05/23  Discharge Physician: Delfino Lovett   PCP: Gracelyn Nurse, MD   Recommendations at discharge:    F/up with outpt providers as requested  Discharge Diagnoses: Principal Problem:   Acute on chronic systolic CHF (congestive heart failure) (HCC) Active Problems:   Acute respiratory failure with hypoxia [facility])   Hypertensive emergency   CAD (coronary artery disease)   Myocardial injury   HLD (hyperlipidemia)   Diabetes mellitus with peripheral circulatory disorder (HCC)   Hyponatremia   Leukocytosis   Dementia without behavioral disturbance (HCC)   Hospice care   Community acquired pneumonia   Hypoxia  Hospital Course: Assessment and Plan:  82 y.o. male with medical history significant of sCHF with EF 35-405,  HTN, HLD, DM, CAD, CBAG, PVD, dementia, who presents with chest pain.   Per his daughter at bedside, pt started having chest pain since afternoon.  The chest pain is located in left side of chest, radiating to the left arm.  Patient cannot characterize his pain in detail due to dementia.  Patient has mild dry cough.  Patient does not have significant shortness breath, but was found to have oxygen desaturation to 80s on room air in ED, which improved to 92-96% on 4 L of oxygen.  Patient is normally not using oxygen at home.  Patient blood pressure is significantly elevated with SBP 200s, nitroglycerin drip is started in ED.    12/15: Palliative care consult.  Repleting electrolytes, weaning off nitroglycerin drip 12/16: CT chest without contrast, pulmonary and nephro consult 12/17: CT abdomen and pelvis for further evaluation renal mass.  Renal ultrasound pending, Decadron started 12/18: Family decided comfort care 12/19: Hospice at home   Acute respiratory failure with hypoxia due to acute on chronic systolic CHF (congestive heart  failure) [facility]):  Patient is 4-5 L of new oxygen requirement.  2D echo on 08/27/2018 showed EF of 35-40%.   - he did require heated high flow nasal cannula 12 L HFNC in the beginning  2D echocardiogram with EF 30 to 35%, previous in 20 2035 to 40%. CT showing bilateral pleural effusion. Diuresed with Lasix. Net IO Since Admission: -10,151.77 mL [03/05/23 2018]  Cardiology seen, He is not a candidate for cardiac catheterization for now Pulmonary seen    Hypertensive emergency:  Initially noted systolic blood pressure in the 200s and now improved Initially needed nitroglycerin drip and later weaned off Continue hydralazine, Coreg, losartan, spironolactone.   CAD (coronary artery disease) Elevated troponins Troponins peaked at 5398 and now trending down at 2647.  Appears to be plateauing.  Suspect supply/demand ischemia in the setting of decompensated heart failure and hypertensive urgency but unable to exclude NSTEMI at this time Treated conservatively with IV heparin for 48 hrs Continue Aspirin, Statin  Cardiology to decide on need for cardiac catheterization as an outpt.  Deferred for now due to his significant hypoxia   HLD (hyperlipidemia) -Lipitor   Diabetes mellitus with peripheral circulatory disorder [facility]):  Patient is taking metformin, Jardiance and glipizide   Hyponatremia:  Suspect hypervolemic hyponatremia Improved with Diuresis   Leukocytosis:  WBC 16.2.  No fever, procalcitonin 0.11 on admission No clinical evidence of infection Hold off antibiotics   Dementia without behavioral disturbance (HCC)  Mental status at baseline.  He did have severe agitation, hallucination,confusion for over 48 hrs - could be Hospital induced delirium  vs terminal agitation   AKI  Rigth Renal mass Likely due to diuresis Seen by Urology & Nephrology Per Urology - most likely a slow-growing renal cell carcinoma.  It was initially identified in 2009 and measured 2.4 x 2.6 cm and 15 years  later with minimal growth at 3.3 x 3.5 cm. Recommend monitoring for now.  GOC Palliative care seen. Family chose comfort care/Hospice for now        Consultants: Urology, Cardio, Nephro, Pulmo, Palliative care Disposition: Hospice care Diet recommendation:  Discharge Diet Orders (From admission, onward)     Start     Ordered   03/05/23 0000  Diet - low sodium heart healthy        03/05/23 0836           Carb modified diet DISCHARGE MEDICATION: Allergies as of 03/05/2023       Reactions   Finasteride    Other Reaction(s): Weakness present   Lisinopril    Other Reaction(s): Chest pain   Penicillins    Statins Other (See Comments)   Tamsulosin Other (See Comments)   Terazosin    Other Reaction(s): Orthostatic hypotension   Pseudoephedrine Palpitations        Medication List     STOP taking these medications    carvedilol 3.125 MG tablet Commonly known as: COREG       TAKE these medications    albuterol 108 (90 Base) MCG/ACT inhaler Commonly known as: VENTOLIN HFA 2 puffs q.i.d. p.r.n. short of breath, wheezing, or cough   amLODipine 10 MG tablet Commonly known as: NORVASC Take 10 mg by mouth daily.   aspirin EC 81 MG tablet Take 81 mg by mouth daily.   atorvastatin 10 MG tablet Commonly known as: LIPITOR Take 10 mg by mouth daily at 6 PM.   furosemide 40 MG tablet Commonly known as: Lasix Take 1 tablet (40 mg total) by mouth daily.   glipiZIDE 5 MG tablet Commonly known as: GLUCOTROL Take 2 tablets by mouth daily.   hydrALAZINE 25 MG tablet Commonly known as: APRESOLINE Take 25 mg by mouth 3 (three) times daily.   isosorbide mononitrate 30 MG 24 hr tablet Commonly known as: IMDUR TAKE ONE TABLET BY MOUTH ONCE DAILY   Jardiance 10 MG Tabs tablet Generic drug: empagliflozin Take 1 tablet by mouth daily with breakfast.   memantine 5 MG tablet Commonly known as: NAMENDA Take 1 tablet by mouth 2 (two) times daily.   metFORMIN 1000  MG tablet Commonly known as: GLUCOPHAGE Take 1,000 mg by mouth 2 (two) times daily with a meal.   risperiDONE 0.5 MG tablet Commonly known as: RISPERDAL Take 1 tablet (0.5 mg total) by mouth at bedtime.   sertraline 50 MG tablet Commonly known as: ZOLOFT Take 1 tablet by mouth daily.               Durable Medical Equipment  (From admission, onward)           Start     Ordered   03/05/23 0842  For home use only DME Hospital bed  Once       Question Answer Comment  Length of Need Lifetime   Bed type Semi-electric      03/05/23 0841            Follow-up Information     Paraschos, Lyn Hollingshead, MD. Go in 1 week(s).   Specialty: Cardiology Contact information: [address] [facility] [place] [address] [phone]  Gracelyn Nurse, MD. Schedule an appointment as soon as possible for a visit in 1 week(s).   Specialty: Internal Medicine Why: [facility] Discharge F/UP Contact information: [address] [facility] [place] [address] [phone]                Discharge Exam: Filed Weights   03/01/23 0500 03/02/23 0347 03/03/23 0349  Weight: 76.4 kg 76.1 kg 78.1 kg   General exam: Calm and comfortable Respiratory system:  Normal work of breathing.  5 L [place] Cardiovascular system: S1-S2, RRR, no murmurs, no pedal edema Gastrointestinal system: Soft, benign Central nervous system: Obtunded, non-focal  Condition at discharge: fair  The results of significant diagnostics from this hospitalization (including imaging, microbiology, ancillary and laboratory) are listed below for reference.   Imaging Studies: CT ABDOMEN W WO CONTRAST Result Date: 03/02/2023 CLINICAL DATA:  Renal mass. EXAM: CT ABDOMEN WITHOUT AND WITH CONTRAST TECHNIQUE: Multidetector CT imaging of the abdomen was performed following the standard protocol before and following the bolus administration of intravenous contrast.  RADIATION DOSE REDUCTION: This exam was performed according to the departmental dose-optimization program which includes automated exposure control, adjustment of the mA and/or kV according to patient size and/or use of iterative reconstruction technique. CONTRAST:  80mL OMNIPAQUE IOHEXOL 300 MG/ML  SOLN COMPARISON:  Chest CT without contrast 03/01/2023. Old CT scan abdomen pelvis 2011 February FINDINGS: Lower chest: Moderate pleural effusions are again seen as on the prior chest CT scan with the adjacent lung opacity. Please correlate with the chest CT. Calcified nodule in the right lung. Enlarged heart. Hepatobiliary: Gallstone seen in the nondilated gallbladder. Preserved hepatic parenchyma without obvious mass. Patent portal vein. Pancreas: Atrophy of the pancreas.  No obvious pancreatic mass Spleen: Normal in size without focal abnormality. Adrenals/Urinary Tract: The adrenal glands are preserved. Mild left-sided renal atrophy. There is some heterogeneous enhancement of the upper pole left kidney, nonspecific. Few tiny low-attenuation lesions identified as well which are too small to completely characterize but likely benign cystic focus. There is 1 lesion however along the upper pole left kidney which may have a thickened wall measuring overall 13 x 11 mm on coronal images and axial series 17 image 14 AP dimension of 10 mm. Wall is somewhat more thick than expected. Wall thickness approaches 2-3 mm. Bosniak 2 Fahrenheit lesion but suspicious. No left-sided renal collecting system dilatation. The right kidney also has some Bosniak 2 small cystic foci. More preserved renal parenchyma. Along the upper pole exophytic laterally is a rim calcified cystic focus measuring 3.6 by 3.1 cm today. Going back to 2011 this lesion when measured in the same fashion as today would measure 3.7 3.2 cm, not significantly increased in size. There are more dystrophic calcifications today. However immediately caudal to this laterally  and partially exophytic is a more solid-appearing lesion today measuring 3.3 by 3.5 cm. This lesion on precontrast has Hounsfield unit of 26, arterial phase of 22, nephrographic phase of 38 and on delay 48. With this type of enhancement this could be a slow growing renal cell carcinoma, papillary subtype. This has much smaller in 2011 measuring 2.7 cm. Stomach/Bowel: On this non oral contrast exam, the visualized bowel is nondilated. Significant colonic stool identified. Stomach is mildly distended with fluid and air. Wall thickening seen along the lower esophagus. Vascular/Lymphatic: Significant atherosclerotic calcified plaque identified along the native aorta with the areas extending into the branch vessels. Significant stenosis suggested of several vessels including the renal arteries bilaterally.  The SMA, hepatic artery origin. There is evidence of the aorta bypass not included in the imaging field in its entirety. Please correlate with surgical history. No specific abnormal lymph node enlargement identified in the visualized portions of the abdomen. Other: No free air or free fluid identified. Protuberance along the anterior abdominal wall with small fat containing hernia seen just above the umbilicus. Musculoskeletal: Curvature of the spine with some degenerative changes. IMPRESSION: Enlarging enhancing mass in the right kidney laterally measuring 3.3 x 3.5 cm. This has worrisome for developing renal cell carcinoma. With the enhancement pattern greatest on the most delayed this could be a papillary subtype. Stable in size complex lesion more superior to this laterally in the right kidney with increasing dystrophic calcification. Benign lesion. There is mildly complex lesion along the upper pole left kidney which has a slightly thickened wall, recommend follow up in 6 months. Bosniak 2 F Significant calcified atherosclerotic plaque along the aorta and branch vessels with multiple areas of potential significant  stenosis including the SMA, bilateral renal arteries. Please correlate with surgical and clinical history. Gallstones. Bilateral pleural effusions and opacities seen. Please correlate with separate chest CT scan. Significant colonic stool. Electronically Signed   By: Karen Kays M.D.   On: 03/02/2023 16:28   CT CHEST WO CONTRAST Result Date: 03/01/2023 CLINICAL DATA:  Dyspnea hypoxemia EXAM: CT CHEST WITHOUT CONTRAST TECHNIQUE: Multidetector CT imaging of the chest was performed following the standard protocol without IV contrast. RADIATION DOSE REDUCTION: This exam was performed according to the departmental dose-optimization program which includes automated exposure control, adjustment of the mA and/or kV according to patient size and/or use of iterative reconstruction technique. COMPARISON:  Chest x-ray 02/26/2023, CT 04/16/2009 FINDINGS: Cardiovascular: Advanced aortic atherosclerosis. No aneurysm. Post CABG changes. Coronary vascular calcification. Mild cardiomegaly. No pericardial effusion Mediastinum/Nodes: Patent trachea. No suspicious thyroid mass. Borderline right paratracheal node measuring 10 mm. Subcarinal lymph node slightly enlarged measuring 19 mm. Esophagus within normal limits. Lungs/Pleura: Moderate bilateral pleural effusions. Emphysema. Partial bilateral lower lobe consolidations. Septal thickening with mild ground-glass density in the upper lobes. Calcified granuloma at the right middle lobe. 7 mm left upper lobe pulmonary nodule, series 4, image 58. Upper Abdomen: No acute finding. Gallstones. Rim calcified complex cyst upper pole right kidney, present since 2011 though with slight increase peripheral calcification, no imaging follow-up is recommended. Slight interval enlargement slightly dense right midpole lesion, measuring 3.6 cm, compared with 2.8 cm previously. Musculoskeletal: Post sternotomy changes. No acute or suspicious osseous abnormality. IMPRESSION: 1. Moderate bilateral  pleural effusions with partial bilateral lower lobe consolidations, either representing atelectasis or pneumonia. Septal thickening with mild ground-glass density in the upper lobes suggestive of edema. 2. Emphysema. 7 mm left upper lobe pulmonary nodule. Non-contrast chest CT at 6-12 months is recommended. If the nodule is stable at time of repeat CT, then future CT at 18-24 months (from today's scan) is considered optional for low-risk patients, but is recommended for high-risk patients. This recommendation follows the consensus statement: Guidelines for Management of Incidental Pulmonary Nodules Detected on CT Images: From the Fleischner Society 2017; Radiology 2017; 284:228-243. 3. Slight interval enlargement of slightly dense right midpole renal lesion, measuring 3.6 cm, compared with 2.8 cm previously. When the patient is clinically stable and able to follow directions and hold their breath (preferably as an outpatient) further evaluation with dedicated abdominal MRI should be considered. 4. Gallstones 5. Aortic atherosclerosis. Aortic Atherosclerosis (ICD10-I70.0) and Emphysema (ICD10-J43.9). Electronically Signed   By: Selena Batten  Jake Samples M.D.   On: 03/01/2023 17:23   DG Chest Port 1 View Result Date: 02/26/2023 CLINICAL DATA:  Hypoxia. EXAM: PORTABLE CHEST 1 VIEW COMPARISON:  02/25/2023. FINDINGS: There is diffuse pulmonary vascular congestion, slightly more pronounced than the prior exam. Redemonstration of left retrocardiac airspace opacity obscuring the left hemidiaphragm, descending thoracic aorta and blunting the left lateral costophrenic angle suggesting combination of left lower lobe atelectasis and/or consolidation with pleural effusion. Right lateral costophrenic angle is clear. Stable cardio-mediastinal silhouette. There are surgical staples along the heart border and sternotomy wires, status post CABG (coronary artery bypass graft). No acute osseous abnormalities. The soft tissues are within normal  limits. IMPRESSION: *Findings favor worsening congestive heart failure/pulmonary edema. *Persistent left retrocardiac opacity, as described above. Electronically Signed   By: Jules Schick M.D.   On: 02/26/2023 18:06   ECHOCARDIOGRAM COMPLETE Result Date: 02/26/2023    ECHOCARDIOGRAM REPORT   Patient Name:   Christopher Snow Date of Exam: 02/26/2023 Medical Rec #:  [id number]         Height:       75.0 in Accession #:    [id number]        Weight:       185.0 lb Date of Birth:  April 07, 1940         BSA:          2.123 m Patient Age:    82 years          BP:           142/68 mmHg Patient Gender: M                 HR:           106 bpm. Exam Location:  [facility] Procedure: 2D Echo, Cardiac Doppler, Color Doppler and 3D Echo Indications:     CHF  History:         Patient has prior history of Echocardiogram examinations, most                  recent 08/27/2018. CHF, CAD and Previous Myocardial Infarction,                  Prior CABG and Abnormal ECG, PAD, Signs/Symptoms:Chest Pain;                  Risk Factors:Hypertension, Diabetes and Dyslipidemia.  Sonographer:     Mikki Harbor Referring Phys:  [id number] Brien Few NIU Diagnosing Phys: Debbe Odea MD IMPRESSIONS  1. Left ventricular ejection fraction, by estimation, is 30 to 35%. The left ventricle has moderate to severely decreased function. The left ventricle demonstrates global hypokinesis. Left ventricular diastolic parameters are indeterminate.  2. Right ventricular systolic function is normal. The right ventricular size is mildly enlarged. There is mildly elevated pulmonary artery systolic pressure.  3. Left atrial size was mildly dilated.  4. The mitral valve is normal in structure. Mild mitral valve regurgitation.  5. Tricuspid valve regurgitation is mild to moderate.  6. The aortic valve has been repaired/replaced. Aortic valve regurgitation is not visualized.  7. The inferior vena cava is dilated in size with <50% respiratory variability, suggesting right  atrial pressure of 15 mmHg. FINDINGS  Left Ventricle: Left ventricular ejection fraction, by estimation, is 30 to 35%. The left ventricle has moderate to severely decreased function. The left ventricle demonstrates global hypokinesis. The left ventricular internal cavity size was normal in size. There is no left ventricular hypertrophy. Left ventricular diastolic parameters are  indeterminate. Right Ventricle: The right ventricular size is mildly enlarged. No increase in right ventricular wall thickness. Right ventricular systolic function is normal. There is mildly elevated pulmonary artery systolic pressure. The tricuspid regurgitant velocity is 2.67 m/s, and with an assumed right atrial pressure of 15 mmHg, the estimated right ventricular systolic pressure is 43.5 mmHg. Left Atrium: Left atrial size was mildly dilated. Right Atrium: Right atrial size was normal in size. Pericardium: There is no evidence of pericardial effusion. Mitral Valve: The mitral valve is normal in structure. Mild mitral valve regurgitation. MV peak gradient, 6.1 mmHg. The mean mitral valve gradient is 2.0 mmHg. Tricuspid Valve: The tricuspid valve is normal in structure. Tricuspid valve regurgitation is mild to moderate. Aortic Valve: The aortic valve has been repaired/replaced. Aortic valve regurgitation is not visualized. Aortic valve mean gradient measures 2.0 mmHg. Aortic valve peak gradient measures 5.2 mmHg. Aortic valve area, by VTI measures 2.67 cm. Pulmonic Valve: The pulmonic valve was normal in structure. Pulmonic valve regurgitation is mild. Aorta: The aortic root is normal in size and structure. Venous: The inferior vena cava is dilated in size with less than 50% respiratory variability, suggesting right atrial pressure of 15 mmHg. IAS/Shunts: No atrial level shunt detected by color flow Doppler.  LEFT VENTRICLE PLAX 2D LVIDd:         5.60 cm LVIDs:         4.90 cm LV PW:         1.20 cm LV IVS:        0.80 cm LVOT diam:      2.00 cm LV SV:         48 LV SV Index:   22 LVOT Area:     3.14 cm  LV Volumes (MOD) LV vol d, MOD A2C: 114.0 ml LV vol d, MOD A4C: 95.0 ml LV vol s, MOD A2C: 73.3 ml LV vol s, MOD A4C: 70.6 ml LV SV MOD A2C:     40.7 ml LV SV MOD A4C:     95.0 ml LV SV MOD BP:      32.1 ml RIGHT VENTRICLE RV Basal diam:  3.95 cm RV Mid diam:    4.20 cm RV S prime:     9.03 cm/s LEFT ATRIUM             Index        RIGHT ATRIUM           Index LA diam:        4.20 cm 1.98 cm/m   RA Area:     14.30 cm LA Vol (A2C):   62.2 ml 29.30 ml/m  RA Volume:   34.00 ml  16.02 ml/m LA Vol (A4C):   78.8 ml 37.12 ml/m LA Biplane Vol: 71.0 ml 33.45 ml/m  AORTIC VALVE                    PULMONIC VALVE AV Area (Vmax):    2.53 cm     PV Vmax:       0.87 m/s AV Area (Vmean):   2.63 cm     PV Peak grad:  3.0 mmHg AV Area (VTI):     2.67 cm AV Vmax:           114.00 cm/s AV Vmean:          68.200 cm/s AV VTI:            0.179 m AV Peak Grad:  5.2 mmHg AV Mean Grad:      2.0 mmHg LVOT Vmax:         91.80 cm/s LVOT Vmean:        57.200 cm/s LVOT VTI:          0.152 m LVOT/AV VTI ratio: 0.85  AORTA Ao Root diam: 3.70 cm MITRAL VALVE                TRICUSPID VALVE MV Area (PHT): 4.54 cm     TR Peak grad:   28.5 mmHg MV Area VTI:   2.45 cm     TR Vmax:        267.00 cm/s MV Peak grad:  6.1 mmHg MV Mean grad:  2.0 mmHg     SHUNTS MV Vmax:       1.23 m/s     Systemic VTI:  0.15 m MV Vmean:      63.0 cm/s    Systemic Diam: 2.00 cm MV Decel Time: 167 msec MV E velocity: 109.00 cm/s Debbe Odea MD Electronically signed by Debbe Odea MD Signature Date/Time: 02/26/2023/1:02:11 PM    Final    DG Chest 2 View Result Date: 02/25/2023 CLINICAL DATA:  Shortness of breath and chest pain. EXAM: CHEST - 2 VIEW COMPARISON:  Chest radiograph dated 03/28/2020. FINDINGS: Small bilateral pleural effusions and bibasilar atelectasis or infiltrate. There is mild cardiomegaly with mild central vascular congestion. No pneumothorax. Median sternotomy  wires and CABG vascular clips. No acute osseous pathology. IMPRESSION: 1. Small bilateral pleural effusions and bibasilar atelectasis or infiltrate. 2. Mild cardiomegaly with mild central vascular congestion. Electronically Signed   By: Elgie Collard M.D.   On: 02/25/2023 18:48    Microbiology: Results for orders placed or performed during the hospital encounter of 02/25/23  Resp panel by RT-PCR (RSV, Flu A&B, Covid) Anterior Nasal Swab     Status: None   Collection Time: 02/25/23 10:09 PM   Specimen: Anterior Nasal Swab  Result Value Ref Range Status   SARS Coronavirus 2 by RT PCR NEGATIVE NEGATIVE Final    Comment: (NOTE) SARS-CoV-2 target nucleic acids are NOT DETECTED.  The SARS-CoV-2 RNA is generally detectable in upper respiratory specimens during the acute phase of infection. The lowest concentration of SARS-CoV-2 viral copies this assay can detect is 138 copies/mL. A negative result does not preclude SARS-Cov-2 infection and should not be used as the sole basis for treatment or other patient management decisions. A negative result may occur with  improper specimen collection/handling, submission of specimen other than nasopharyngeal swab, presence of viral mutation(s) within the areas targeted by this assay, and inadequate number of viral copies(<138 copies/mL). A negative result must be combined with clinical observations, patient history, and epidemiological information. The expected result is Negative.  Fact Sheet for Patients:  BloggerCourse.com  Fact Sheet for Healthcare Providers:  SeriousBroker.it  This test is no t yet approved or cleared by the [place] FDA and  has been authorized for detection and/or diagnosis of SARS-CoV-2 by FDA under an Emergency Use Authorization (EUA). This EUA will remain  in effect (meaning this test can be used) for the duration of the COVID-19 declaration under Section 564(b)(1)  of the Act, 21 U.S.C.section 360bbb-3(b)(1), unless the authorization is terminated  or revoked sooner.       Influenza A by PCR NEGATIVE NEGATIVE Final   Influenza B by PCR NEGATIVE NEGATIVE Final    Comment: (NOTE) The Xpert Xpress SARS-CoV-2/FLU/RSV plus assay is intended as  an aid in the diagnosis of influenza from Nasopharyngeal swab specimens and should not be used as a sole basis for treatment. Nasal washings and aspirates are unacceptable for Xpert Xpress SARS-CoV-2/FLU/RSV testing.  Fact Sheet for Patients: BloggerCourse.com  Fact Sheet for Healthcare Providers: SeriousBroker.it  This test is not yet approved or cleared by the [place] FDA and has been authorized for detection and/or diagnosis of SARS-CoV-2 by FDA under an Emergency Use Authorization (EUA). This EUA will remain in effect (meaning this test can be used) for the duration of the COVID-19 declaration under Section 564(b)(1) of the Act, 21 U.S.C. section 360bbb-3(b)(1), unless the authorization is terminated or revoked.     Resp Syncytial Virus by PCR NEGATIVE NEGATIVE Final    Comment: (NOTE) Fact Sheet for Patients: BloggerCourse.com  Fact Sheet for Healthcare Providers: SeriousBroker.it  This test is not yet approved or cleared by the [place] FDA and has been authorized for detection and/or diagnosis of SARS-CoV-2 by FDA under an Emergency Use Authorization (EUA). This EUA will remain in effect (meaning this test can be used) for the duration of the COVID-19 declaration under Section 564(b)(1) of the Act, 21 U.S.C. section 360bbb-3(b)(1), unless the authorization is terminated or revoked.  Performed at [facility], [address] [place], [place] [address]   Blood Culture (routine x 2)     Status: None   Collection Time: 02/25/23 10:10 PM   Specimen: BLOOD  Result Value  Ref Range Status   Specimen Description BLOOD LEFT ASSIST CONTROL  Final   Special Requests   Final    BOTTLES DRAWN AEROBIC AND ANAEROBIC Blood Culture adequate volume   Culture   Final    NO GROWTH 5 DAYS Performed at [facility], [address] [place], [place] [address]    Report Status 03/02/2023 FINAL  Final  Blood Culture (routine x 2)     Status: None   Collection Time: 02/25/23 10:10 PM   Specimen: BLOOD  Result Value Ref Range Status   Specimen Description BLOOD RIGHT FOREARM  Final   Special Requests   Final    BOTTLES DRAWN AEROBIC AND ANAEROBIC Blood Culture adequate volume   Culture   Final    NO GROWTH 5 DAYS Performed at [facility], [address] [place], [place] [address]    Report Status 03/02/2023 FINAL  Final  MRSA Next Gen by PCR, Nasal     Status: None   Collection Time: 03/02/23  6:00 AM   Specimen: Nasal Mucosa; Nasal Swab  Result Value Ref Range Status   MRSA by PCR Next Gen NOT DETECTED NOT DETECTED Final    Comment: (NOTE) The GeneXpert MRSA Assay (FDA approved for NASAL specimens only), is one component of a comprehensive MRSA colonization surveillance program. It is not intended to diagnose MRSA infection nor to guide or monitor treatment for MRSA infections. Test performance is not FDA approved in patients less than 43 years old. Performed at [facility], [address] [place], [place] [address]   Respiratory (~20 pathogens) panel by PCR     Status: None   Collection Time: 03/02/23  6:00 AM   Specimen: Nasopharyngeal Swab; Respiratory  Result Value Ref Range Status   Adenovirus NOT DETECTED NOT DETECTED Final   Coronavirus 229E NOT DETECTED NOT DETECTED Final    Comment: (NOTE) The Coronavirus on the Respiratory Panel, DOES NOT test for the novel  Coronavirus (2019 nCoV)    Coronavirus HKU1 NOT  DETECTED NOT DETECTED Final   Coronavirus NL63 NOT DETECTED NOT DETECTED Final   Coronavirus OC43 NOT  DETECTED NOT DETECTED Final   Metapneumovirus NOT DETECTED NOT DETECTED Final   Rhinovirus / Enterovirus NOT DETECTED NOT DETECTED Final   Influenza A NOT DETECTED NOT DETECTED Final   Influenza B NOT DETECTED NOT DETECTED Final   Parainfluenza Virus 1 NOT DETECTED NOT DETECTED Final   Parainfluenza Virus 2 NOT DETECTED NOT DETECTED Final   Parainfluenza Virus 3 NOT DETECTED NOT DETECTED Final   Parainfluenza Virus 4 NOT DETECTED NOT DETECTED Final   Respiratory Syncytial Virus NOT DETECTED NOT DETECTED Final   Bordetella pertussis NOT DETECTED NOT DETECTED Final   Bordetella Parapertussis NOT DETECTED NOT DETECTED Final   Chlamydophila pneumoniae NOT DETECTED NOT DETECTED Final   Mycoplasma pneumoniae NOT DETECTED NOT DETECTED Final    Comment: Performed at [facility], [address], [place], [place] [address]    Labs: CBC: Recent Labs  Lab 02/27/23 [phone] 02/28/23 0434 03/01/23 0608 03/02/23 0456 03/03/23 0707  WBC 13.0* 10.6* 9.0 9.8 7.6  NEUTROABS 10.1*  --   --   --   --   HGB 10.7* 10.1* 10.9* 9.8* 9.6*  HCT 33.4* 32.1* 34.4* 30.6* 30.2*  MCV 72.9* 73.6* 72.9* 72.9* 72.9*  PLT 195 196 260 257 299   Basic Metabolic Panel: Recent Labs  Lab 02/27/23 0152 02/28/23 0434 03/01/23 0608 03/02/23 0456 03/03/23 0449  NA 128* 132* 130* 127* 128*  K 3.5 3.2* 4.0 3.9 4.4  CL 93* 97* 95* 94* 95*  CO2 22 24 23  21* 20*  GLUCOSE 154* 147* 175* 253* 225*  BUN 28* 38* 48* 53* 56*  CREATININE 1.45* 1.52* 1.72* 1.63* 1.40*  CALCIUM 8.1* 8.0* 8.5* 8.3* 8.6*  MG 2.1 2.1 2.6* 2.5* 2.3  PHOS  --   --  4.1 4.4 4.6   Liver Function Tests: Recent Labs  Lab 03/03/23 0449  AST 19  ALT 26  ALKPHOS 83  BILITOT 0.8  PROT 6.0*  ALBUMIN 3.1*   CBG: Recent Labs  Lab 03/02/23 1553 03/02/23 2052 03/03/23 0858 03/03/23 1222 03/03/23 2051  GLUCAP 225* 225* 219* 236* 381*    Discharge time spent: greater than 30 minutes.  Signed: Delfino Lovett, MD [facility] 03/05/2023

## 2023-03-05 NOTE — TOC Transition Note (Addendum)
Transition of Care [facility]) - Discharge Note   Patient Details  Name: Christopher Snow MRN: [id number] Date of Birth: September 22, 1940  [facility] CM/SW Contact:  Truddie Hidden, RN Phone Number: 03/05/2023, 9:44 AM   Clinical Narrative:    Patient will be discharging home today with hospice via [facility]. Face sheet and medical necessity forms printed to floor.  2:00 EMS arranged   TOC signing off.      Barriers to Discharge: Continued Medical Work up   Patient Goals and CMS Choice            Discharge Placement                       Discharge Plan and Services Additional resources added to the After Visit Summary for       Post Acute Care Choice: NA                               Social Drivers of Health (SDOH) Interventions SDOH Screenings   Food Insecurity: No Food Insecurity (02/26/2023)  Housing: Low Risk  (02/26/2023)  Transportation Needs: No Transportation Needs (02/26/2023)  Utilities: Not At Risk (02/26/2023)  Financial Resource Strain: Low Risk  (04/02/2020)   Received from [facility]  Physical Activity: Insufficiently Active (11/07/2018)  Social Connections: Moderately Integrated (11/07/2018)  Stress: No Stress Concern Present (11/07/2018)  Tobacco Use: Medium Risk (03/02/2023)     Readmission Risk Interventions    03/01/2023    4:11 PM  Readmission Risk Prevention Plan  Transportation Screening Complete  PCP or Specialist Appt within 3-5 Days Complete  Social Work Consult for Recovery Care Planning/Counseling Complete  Palliative Care Screening Complete  Medication Review Oceanographer) Complete

## 2023-04-04 NOTE — Progress Notes (Signed)
[facility] Cardiology    SUBJECTIVE: Patient resting comfortably in bed still dyspneic on supplemental oxygen nonambulatory except for states that he is not any worse but with any activity he gets very dyspneic   Vitals:   03/04/23 2156 03/05/23 0913 03/05/23 1132 03/05/23 1620  BP: (!) 143/62 (!) 154/58 (!) 140/54 (!) 155/60  Pulse: 69 64 70 65  Resp: 20 20 16 16   Temp: (!) 97 F (36.1 C)  98 F (36.7 C) 97.7 F (36.5 C)  TempSrc: Axillary   Oral  SpO2: 96% 98% 98% 99%  Weight:      Height:        No intake or output data in the 24 hours ending 04/04/23 1324    PHYSICAL EXAM  General: Well developed, well nourished, in no acute distress HEENT:  Normocephalic and atramatic Neck:  No JVD.  Lungs: Clear bilaterally to auscultation and percussion. Heart: HRRR . Normal S1 and S2 without gallops or murmurs.  Abdomen: Bowel sounds are positive, abdomen soft and non-tender  Msk:  Back normal, normal gait. Normal strength and tone for age. Extremities: No clubbing, cyanosis or edema.   Neuro: Alert and oriented X 3. Psych:  Good affect, responds appropriately   LABS: Basic Metabolic Panel: No results for input(s): "NA", "K", "CL", "CO2", "GLUCOSE", "BUN", "CREATININE", "CALCIUM", "MG", "PHOS" in the last 72 hours. Liver Function Tests: No results for input(s): "AST", "ALT", "ALKPHOS", "BILITOT", "PROT", "ALBUMIN" in the last 72 hours. No results for input(s): "LIPASE", "AMYLASE" in the last 72 hours. CBC: No results for input(s): "WBC", "NEUTROABS", "HGB", "HCT", "MCV", "PLT" in the last 72 hours. Cardiac Enzymes: No results for input(s): "CKTOTAL", "CKMB", "CKMBINDEX", "TROPONINI" in the last 72 hours. BNP: Invalid input(s): "POCBNP" D-Dimer: No results for input(s): "DDIMER" in the last 72 hours. Hemoglobin A1C: No results for input(s): "HGBA1C" in the last 72 hours. Fasting Lipid Panel: No results for input(s): "CHOL", "HDL", "LDLCALC", "TRIG", "CHOLHDL", "LDLDIRECT" in  the last 72 hours. Thyroid Function Tests: No results for input(s): "TSH", "T4TOTAL", "T3FREE", "THYROIDAB" in the last 72 hours.  Invalid input(s): "FREET3" Anemia Panel: No results for input(s): "VITAMINB12", "FOLATE", "FERRITIN", "TIBC", "IRON", "RETICCTPCT" in the last 72 hours.  No results found.   Echo mildly elevated to severely depressed left ventricular function EF of 30 to 35%  TELEMETRY: Normal sinus rhythm rate of 75 nonspecific ST-T changes:  ASSESSMENT AND PLAN:  Principal Problem:   Acute on chronic systolic CHF (congestive heart failure) (HCC) Active Problems:   CAD (coronary artery disease)   Hypertensive emergency   Acute respiratory failure with hypoxia (HCC)   Myocardial injury   HLD (hyperlipidemia)   Diabetes mellitus with peripheral circulatory disorder (HCC)   Hyponatremia   Leukocytosis   Dementia without behavioral disturbance (HCC)   Hospice care   Community acquired pneumonia   Hypoxia    Plan Acute on chronic systolic congestive heart failure EF around 30 to 35% continue medical therapy to help with heart failure management Patient still relatively dyspneic unable to lie flat COPD heart failure do not recommend cardiac cath at this point because of worsening renal function and inability to lie flat Manage breath dyspnea COPD as well as heart failure continue supplemental oxygen and pulmonary input Acute on chronic systolic congestive heart failure continue diuresis with IV Lasix Acute on chronic renal insufficiency avoid nephrotoxic drugs or contrast until improved  Multivessel coronary disease including coronary bypass surgery with anginal symptoms Non-STEMI elevated troponins chest pain improved continue  current management anticoagulation Diabetes type 2 uncomplicated continue current therapy Abnormal chest x-ray imaging possible heart failure pneumonia bronchitis continue broad-spectrum antibiotic therapy Dementia persistent continue current  management Respiratory status COPD continue to be managed by pulmonary inhalers diuretics steroids as necessary      Alwyn Pea, MD, 04/04/2023 1:24 PM

## 2023-04-17 DEATH — deceased
